# Patient Record
Sex: Female | Born: 1950 | Race: White | Hispanic: No | Smoking: Former smoker
Health system: Southern US, Community
[De-identification: ages and names within clinical notes are randomized; demographics above are authoritative.]

## PROBLEM LIST (undated history)

## (undated) DIAGNOSIS — F32A Depression, unspecified: Secondary | ICD-10-CM

## (undated) DIAGNOSIS — G43909 Migraine, unspecified, not intractable, without status migrainosus: Secondary | ICD-10-CM

## (undated) DIAGNOSIS — E079 Disorder of thyroid, unspecified: Secondary | ICD-10-CM

## (undated) DIAGNOSIS — G4730 Sleep apnea, unspecified: Secondary | ICD-10-CM

## (undated) DIAGNOSIS — Z9289 Personal history of other medical treatment: Secondary | ICD-10-CM

## (undated) DIAGNOSIS — I10 Essential (primary) hypertension: Secondary | ICD-10-CM

## (undated) DIAGNOSIS — M1990 Unspecified osteoarthritis, unspecified site: Secondary | ICD-10-CM

## (undated) DIAGNOSIS — E0590 Thyrotoxicosis, unspecified without thyrotoxic crisis or storm: Secondary | ICD-10-CM

## (undated) DIAGNOSIS — F319 Bipolar disorder, unspecified: Secondary | ICD-10-CM

## (undated) DIAGNOSIS — N189 Chronic kidney disease, unspecified: Secondary | ICD-10-CM

## (undated) DIAGNOSIS — I499 Cardiac arrhythmia, unspecified: Secondary | ICD-10-CM

## (undated) DIAGNOSIS — F329 Major depressive disorder, single episode, unspecified: Secondary | ICD-10-CM

## (undated) DIAGNOSIS — F419 Anxiety disorder, unspecified: Secondary | ICD-10-CM

## (undated) DIAGNOSIS — R001 Bradycardia, unspecified: Secondary | ICD-10-CM

## (undated) HISTORY — DX: Anxiety disorder, unspecified: F41.9

## (undated) HISTORY — PX: HAMMER TOE SURGERY: SHX385

## (undated) HISTORY — PX: BUNIONECTOMY: SHX129

## (undated) HISTORY — DX: Bradycardia, unspecified: R00.1

## (undated) HISTORY — PX: ABLATION SAPHENOUS VEIN W/ RFA: SUR11

## (undated) HISTORY — PX: BLADDER SURGERY: SHX569

## (undated) HISTORY — PX: ABDOMINAL HYSTERECTOMY: SHX81

## (undated) HISTORY — PX: ROTATOR CUFF REPAIR: SHX139

---

## 2000-03-29 ENCOUNTER — Encounter: Payer: Self-pay | Admitting: Family Medicine

## 2000-03-29 ENCOUNTER — Encounter: Admission: RE | Admit: 2000-03-29 | Discharge: 2000-03-29 | Payer: Self-pay | Admitting: Family Medicine

## 2000-04-04 ENCOUNTER — Encounter: Admission: RE | Admit: 2000-04-04 | Discharge: 2000-04-04 | Payer: Self-pay | Admitting: Gastroenterology

## 2000-04-04 ENCOUNTER — Encounter: Payer: Self-pay | Admitting: Gastroenterology

## 2001-07-08 ENCOUNTER — Encounter: Payer: Self-pay | Admitting: Emergency Medicine

## 2001-07-08 ENCOUNTER — Emergency Department (HOSPITAL_COMMUNITY): Admission: EM | Admit: 2001-07-08 | Discharge: 2001-07-08 | Payer: Self-pay | Admitting: Emergency Medicine

## 2002-05-11 ENCOUNTER — Other Ambulatory Visit: Admission: RE | Admit: 2002-05-11 | Discharge: 2002-05-11 | Payer: Self-pay | Admitting: Obstetrics and Gynecology

## 2002-06-08 ENCOUNTER — Observation Stay (HOSPITAL_COMMUNITY): Admission: RE | Admit: 2002-06-08 | Discharge: 2002-06-09 | Payer: Self-pay | Admitting: Urology

## 2002-10-13 ENCOUNTER — Emergency Department (HOSPITAL_COMMUNITY): Admission: EM | Admit: 2002-10-13 | Discharge: 2002-10-14 | Payer: Self-pay | Admitting: Emergency Medicine

## 2002-10-13 ENCOUNTER — Encounter: Payer: Self-pay | Admitting: Family Medicine

## 2002-10-13 ENCOUNTER — Ambulatory Visit (HOSPITAL_COMMUNITY): Admission: RE | Admit: 2002-10-13 | Discharge: 2002-10-13 | Payer: Self-pay | Admitting: Family Medicine

## 2003-02-25 ENCOUNTER — Encounter: Admission: RE | Admit: 2003-02-25 | Discharge: 2003-02-25 | Payer: Self-pay | Admitting: Family Medicine

## 2003-02-25 ENCOUNTER — Encounter: Payer: Self-pay | Admitting: Family Medicine

## 2004-11-27 ENCOUNTER — Ambulatory Visit (HOSPITAL_BASED_OUTPATIENT_CLINIC_OR_DEPARTMENT_OTHER): Admission: RE | Admit: 2004-11-27 | Discharge: 2004-11-27 | Payer: Self-pay | Admitting: *Deleted

## 2004-12-10 ENCOUNTER — Ambulatory Visit: Payer: Self-pay | Admitting: Internal Medicine

## 2004-12-14 ENCOUNTER — Ambulatory Visit (HOSPITAL_COMMUNITY): Admission: RE | Admit: 2004-12-14 | Discharge: 2004-12-14 | Payer: Self-pay | Admitting: Chiropractic Medicine

## 2005-05-08 ENCOUNTER — Ambulatory Visit: Payer: Self-pay | Admitting: Internal Medicine

## 2005-06-11 ENCOUNTER — Ambulatory Visit: Payer: Self-pay | Admitting: Internal Medicine

## 2006-01-15 ENCOUNTER — Ambulatory Visit: Payer: Self-pay | Admitting: Internal Medicine

## 2006-06-26 ENCOUNTER — Emergency Department (HOSPITAL_COMMUNITY): Admission: EM | Admit: 2006-06-26 | Discharge: 2006-06-26 | Payer: Self-pay | Admitting: Emergency Medicine

## 2006-08-27 HISTORY — PX: JOINT REPLACEMENT: SHX530

## 2006-12-26 ENCOUNTER — Ambulatory Visit (HOSPITAL_COMMUNITY): Admission: RE | Admit: 2006-12-26 | Discharge: 2006-12-27 | Payer: Self-pay | Admitting: Urology

## 2007-04-17 ENCOUNTER — Encounter: Admission: RE | Admit: 2007-04-17 | Discharge: 2007-04-17 | Payer: Self-pay | Admitting: Orthopedic Surgery

## 2007-06-12 ENCOUNTER — Encounter: Admission: RE | Admit: 2007-06-12 | Discharge: 2007-06-12 | Payer: Self-pay | Admitting: Orthopedic Surgery

## 2007-07-07 ENCOUNTER — Inpatient Hospital Stay (HOSPITAL_COMMUNITY): Admission: RE | Admit: 2007-07-07 | Discharge: 2007-07-11 | Payer: Self-pay | Admitting: Orthopedic Surgery

## 2009-12-09 ENCOUNTER — Encounter: Admission: RE | Admit: 2009-12-09 | Discharge: 2009-12-09 | Payer: Self-pay | Admitting: Family Medicine

## 2009-12-28 ENCOUNTER — Ambulatory Visit (HOSPITAL_COMMUNITY): Admission: RE | Admit: 2009-12-28 | Discharge: 2009-12-28 | Payer: Self-pay | Admitting: Family Medicine

## 2010-04-20 ENCOUNTER — Encounter: Admission: RE | Admit: 2010-04-20 | Discharge: 2010-04-20 | Payer: Self-pay | Admitting: Family Medicine

## 2011-01-09 NOTE — Op Note (Signed)
NAME:  ITALY, WARRINER NO.:  [id number]   MEDICAL RECORD NO.:  [id number]          PATIENT TYPE:  INP   LOCATION:  5035                         FACILITY:  [facility]   PHYSICIAN:  Feliberto Gottron. Turner Daniels, M.D.   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   OPERATION PERFORMED:  Left total hip arthroplasty using 250 two  millimeter ASR cup,  NK plus 046 mm ultimate ball, 18 x 13 x 42 x 160 S-  ROM stem, 18 D small cone.   SURGEON:  Feliberto Gottron. Turner Daniels, M.D.   FIRST ASSISTANT:  Erskine Squibb B. Su Hilt, P. A.-C   ANESTHESIA:  General endotracheal.   ESTIMATED BLOOD LOSS:  The estimated blood loss was 400 mL.   FLUIDS REPLACED:  The fluid replacement was 1600 mL of crystalloid.   DRAINS:  The drain placed was a Foley catheter.   URINE OUTPUT:  The urine output was 300 mL.   INDICATIONS FOR THE SURGERY:  The patient is a 60 year old woman with  bone-on-bone arthritic changes of her left hip initially evaluated by  Dr. Lunette Stands and Dr. Salvatore Marvel.  She had fairly impressive super  acetabular cyst.  CT scan was accomplished showing the cyst and she was  seen in consultation for this problem in our office a few weeks ago.  The risks and benefits of surgical intervention, specifically hip  replacement and possible bone grafting the cyst were discussed with the  patient.  She desires to have the hip replaced to decrease pain and  increase function; and, again, the risks and benefits were discussed,  and questions were answered.   DESCRIPTION OF THE OPERATION:  The patient was identified by her armband  and was brought to the operating room at [facility].  Appropriate inspect monitors were attached and general  endotracheal  anesthesia was induced with the patient in the supine position.  She was  then rolled into the right lateral decubitus position and fixed there  was Gaspar Garbe II pelvic clamp.  The left lower extremity was prepped  and  draped in the usual sterile fashion from the ankle to the  hemipelvis.   The skin along the lateral hip and thigh was then infiltrated with 25 mL  of one-half percent Marcaine and epinephrine solution and a  posterolateral approach to the hip joint was made with an incision about  25 cm in length centered over the greater trochanter.  She had a very  generous adipose pad.  Adson-Beckman retractors were needed to move the  fat pad out of the way as we incised the intertrochanteric band in line  with the skin incision exposing the greater trochanter.  Cobra  retractors were then placed between the gluteus minimus, the superior  joint capsule, between the quadratus femoris  and the inferior joint  capsule.  This isolated the short external rotators and piriformis,  which were cutoff at their insertion on the intertrochanteric crest now  fully exposing the hip joint capsule, which was undeveloped into an  acetabular-based flap from posterosuperior to posteroinferior.  This  flap was tagged with two #2 Ethibond sutures; and, the hip was flexed  and internally rotated dislocating the femoral head.  Small bleeders  were identified and cauterized with the electrocautery.   A standard neck cut was then performed one fingerbreadth above the  lesser trochanter removing the femoral head, which was down to bare bone  with arthritic changes along the sourcil region. The proximal femur was  then translated anteriorly, levering off the anterior column with a  Cobra retractor.  A wing retractor was placed at the junction of the  acetabulum and the ischium, and then another Cobra spiked retractor was  placed in the cotyloid notch.  This exposed the acetabulum.  The labrum   and pulvinar were removed with the electrocautery.  We then sequentially  reamed up to a 51 mm basket reamer obtaining a good cut and just into  the subchondral bone and all quadrants and we actually encountered very  little in the was of the cyst.  This was probed and a Engineer, site.   At this point we hammered into place a 52 mm ARS cup in 45 degrees of  abduction and about 30 degrees of anteversion obtaining a good fit as  the component was driven.  The hip was then flexed and  internally  rotated exposing the proximal femur, which was entered with the  initiating reamer followed by axial reaming up to 12 mm where we started  getting a little bit of chatter.  We then went by one-half millimeter up  the 13.5, obtained good cortical chatter through the full depth of the  reamer.  We then conically reamed up to an 18 D cone and milled the  calcar to an 18 D small calcar.  At this point a trial 18 D small cone  was placed in the femur followed by trial stem with a 42 base neck NK  plus 046-mm trial ball.  The hip was then relocated.  It could not be  dislocated anteriorly in extension and external rotation; and, flexion  to 90 with 70 of internal rotation revealed good stability.   At this point the trial components were removed.  An 80 D small ZTT-1  cone was then hammered into the proximal femur.  The 13.5 reamer was run  inside the cone down the canal one more time and a 18 x 13 x 160 stem  was then driven through the cone down the femur in the same version as  the calcar until it seated.  An NK +046 mm ultimate ball was then  hammered onto the stem.  The hip once again relocated.  Stability was  noted to be excellent.  The wound was irrigated out normal saline  solution.  No significant bleeding was noted and the intertrochanteric  band was then closed with running #1 Vicryl suture.  The subcutaneous  tissue with 0-suture in two layers and then 2-0 suture and one layer,  and the skin with  running interlocking 3-0 nylon suture.  A dressing of  Xeroform and metel were then applied.   The patient was unclamped, laid supine, awakened and taken to the  recovery room without difficulty.  Feliberto Gottron. Turner Daniels, M.D.  Electronically Signed     FJR/MEDQ  D:  07/07/2007  T:  07/08/2007  Job:  [id number]

## 2011-01-12 NOTE — Procedures (Signed)
NAME:  Heather Gray, Heather Gray NO.:  [id number]   MEDICAL RECORD NO.:  [id number]          PATIENT TYPE:  OUT   LOCATION:  SLEEP CENTER                 FACILITY:  [facility]   PHYSICIAN:  Clinton D. Maple Hudson, M.D. DATE OF BIRTH:  08-Jun-1951   DATE OF STUDY:  11/27/2004                              NOCTURNAL POLYSOMNOGRAM   DATE OF STUDY:  November 27, 2004   REFERRING PHYSICIAN:  Dr. Dara Hoyer   INDICATION FOR STUDY:  Hypersomnia with sleep apnea.  Epworth Sleepiness  Score 19/24, BMI 33, weight 203 pounds.   SLEEP ARCHITECTURE:  Total sleep time 413 minutes with sleep efficiency 92%.  Stage I was 7%, stage II 68%, stages III and IV were absent, REM was 26% of  total sleep time.  Sleep latency 6 minutes, REM latency 101 minutes, awake  after sleep onset 30 minutes, arousal index 9.7.  Patient took Klonopin and  Toprol during the evening, prior to the study.   RESPIRATORY DATA:  Split-study protocol.  Respiratory disturbance index  (RDI, AHI) 19.9 obstructive events per hour indicating mild to moderate  obstructive sleep apnea/hypopnea syndrome before CPAP.  This included 56  hypopneas before CPAP.  Events were not positional but initial sleep and  most events  were mostly on the left side.  REM RDI 19.2.  CPAP was titrated  to 8 CWP, RDI 0.4 per hour.  A petite Respironics ComfortGel Nasal Mask was  used with heated humidifier.  Technician also suggested a chin strap be  provided.   OXYGEN DATA:  Moderate snoring with oxygen desaturation to a nadir of 88%  before CPAP control.  On CPAP saturation held 96-98% on room air.   CARDIAC DATA:  Normal sinus rhythm with rare PVC and PAC.   MOVEMENT/PARASOMNIA:  Occasional leg jerks with arousal, probably not  significant.   IMPRESSION/RECOMMENDATION:  1.  Mild to moderate obstructive sleep apnea/hypopnea syndrome, respiratory      disturbance index 19.9 per hour with moderate snoring and oxygen      desaturation to 88%.  2.   Continuous positive airway pressure titration to 8 CWP, respiratory      disturbance index 0.4 per hour using a petite Respironics ComfortGel      Nasal Mask with heated humidifier.      CDY/MEDQ  D:  12/10/2004 16:20:26  T:  12/10/2004 21:02:08  Job:  [id number]

## 2011-01-12 NOTE — Discharge Summary (Signed)
NAME:  Heather Gray, Heather Gray NO.:  [id number]   MEDICAL RECORD NO.:  [id number]          PATIENT TYPE:  INP   LOCATION:  5035                         FACILITY:  [facility]   PHYSICIAN:  Feliberto Gottron. Turner Daniels, M.D.   DATE OF BIRTH:  Apr 20, 1951   DATE OF ADMISSION:  07/07/2007  DATE OF DISCHARGE:  07/11/2007                               DISCHARGE SUMMARY   DIAGNOSIS:  End stage degenerative joint disease of the left hip.   PROCEDURES WHILE IN HOSPITAL:  Left total hip arthroplasty.   HISTORY OF PRESENT ILLNESS:  The patient is a 60 year old woman with  many year history of pain in her left hip.  She was followed  conservatively and has failed conservative care and wishes to proceed  with total hip arthroplasty, after discussing the risks versus benefits.   ALLERGIES:  TETRACYCLINE, DILAUDID.   MEDICATIONS:  1. Topamax.  2. Baclofen.  3. Toprol.  4. Vicodin.   PAST MEDICAL HISTORY:  Usual childhood diseases.  Adult history of  hypertension, sleep apnea, migraines, fibromyalgia.   PAST SURGICAL HISTORY:  Hysterectomy, bladder surgery, __________   SOCIAL HISTORY:  No tobacco, no IV drug abuse.  She is divorced.   FAMILY HISTORY:  Noncontributory.   REVIEW OF SYSTEMS:  Glasses, headaches, lower dentures.  Denies  shortness of breath, chest pain or illness.   PHYSICAL EXAMINATION:  Exam unremarkable except for extremities which  showed left hip internal rotation of 0 with pain with external rotation  at 10 degrees with positive pain, foot tip is negative.  She has bone on  bone with cystic changes, end-stage degenerative joint disease of the  hip.   LABORATORY DATA:  Preoperative labs including CBC, CMET, chest x-ray,  PT/PTT did not prevent further progress.   HOSPITAL COURSE:  She was taken to the operating room at [facility]  on the day of admission where she underwent a left total hip  arthroplasty using a 52 mm ASR cup and AK +0 46 mm Ultima ball, 18 x 13  x 42  x 160 SROM stem and 18 D small cone.  A Foley catheter was placed.  The patient was placed on perioperative antibiotics and postoperative  Coumadin for prophylaxis and postoperative PCA for pain control.  On  postoperative day #1 the patient was awake and alert with no nausea.  Vital signs were stable.  Wound was clean and dry.  Hemoglobin  __________ , otherwise medically stable and began physical therapy in  earnest.  On postoperative day #2 the patient was without complaint,  positive nausea and vomiting, T max 101.7, INR 1.2.  Dressing was dry.  Foley was discontinued.  On postoperative day #3 the patient was meeting  her physical therapy goals well and progressing well.  On postoperative  day #4 the patient was without complaint was independent with transfers  and ready to go home.  She was afebrile.  INR was 1.6.  PT 19.5.  Dressing with scant drainage.  No shortness of breath.  Sutures were  intact.  She was  medically stable and orthopedically improved.  She was  discharged home in the care of her family.   ACTIVITY:  Weight-bearing as tolerated with walker.  Total hip  precautions.   DISCHARGE MEDICATIONS:  1. Tylox.  2. Coumadin per [facility].  3. Resume home medications as formerly on medications sheet.   DIET:  Regular.   WOUND CARE:  Dressing changes every other day.   FOLLOWUP:  May return in five days for followup check.      Laural Benes. Jannet Mantis.      Feliberto Gottron. Turner Daniels, M.D.  Electronically Signed    JBR/MEDQ  D:  09/18/2007  T:  09/19/2007  Job:  [id number]

## 2011-01-12 NOTE — Op Note (Signed)
NAME:  Heather Gray, Heather Gray NO.:  [id number]   MEDICAL RECORD NO.:  [id number]          PATIENT TYPE:  AMB   LOCATION:  DAY                          FACILITY:  [facility]   PHYSICIAN:  Terie Purser, MD         DATE OF BIRTH:  [birth year]   DATE OF PROCEDURE:  DATE OF DISCHARGE:                               OPERATIVE REPORT   PREOPERATIVE DIAGNOSIS:  Vaginal extrusion of pubovaginal sling.   POSTOPERATIVE DIAGNOSIS:  Vaginal extrusion of pubovaginal sling.   PROCEDURE:  Excision of vaginal extrusion of sling.  Placement of  Xenoform packing.  Right lateral vaginoplasty.   SURGEON:  Sigmund I. Patsi Sears, M.D.   ASSISTANTWilson Singer   ANESTHESIA:  General.   BLOOD LOSS:  Minimal.   COMPLICATIONS:  None   DISPOSITION:  Stable to post anesthesia care unit.   INDICATIONS FOR PROCEDURE:  This patient is a 60 year old female who had  a history of SPARC sling placed in 2003.  Approximately 2 months ago,  she underwent evaluation by her gynecologist for recurrent urinary tract  infection and was found to have vaginal extrusion of her sling.  She was  evaluated by Dr. Patsi Sears in the office and a decision was made to  take to her to the operating room for excision of extruded sling.  Benefits and risks of procedure were explained and consent was obtained.   DESCRIPTION OF PROCEDURE:  The patient brought to the operating room and  was properly identified.  Time-out was performed to confirm correct  patient and procedure.  She was administered general anesthesia and  given preoperative antibiotics and then placed in the lithotomy  position.  She was prepped and draped in the sterile manner.  Care was  taken to ensure proper positioning of legs to prevent peripheral  neuropathy and DVT.  We then placed a weighted vaginal speculum.  Examination revealed an approximate 1.5-cm extrusion of sling located  over the urethra.  A Foley catheter was placed.  We grasped the extruded  sling material and used this for traction.  We then used a knife to  incise the vaginal mucosa at the lateral edges surrounding the  extrusion.  This was taken widely to develop a flap of tissue.  We  dissected carefully as much of the sling as possible, taking care not to  injure the urethra.  When we had proceeded far enough, we cut the sling  material on the left side.  There appeared to be enough vaginal mucosa  to close over this.  Further excision of the sling risked injury to the  urethra and bladder and was deemed not necessary at this point.  We then  turned our attention to the right side.  Again, a similar technique was  used to create a vaginal flap and mobilization of the sling.  We were  able to take this back a bit farther on this side compared to the left.  The sling material was then excised.  Hemostasis was obtained with the  electrocautery.  There  was a persistent ooze from this region.  We  elected to pack this cavity with an approximately 2 x 3 cm piece of  Xenoform graft.  We then proceeded with closure of the vaginal mucosa.  This was done in a running fashion with 2-0 Monocryl suture.  Copious  amounts of antibiotic-soaked solution were used to irrigate throughout  the procedure.  We did appear to have an excellent closure of the mucosa  with no further evidence of sling material.   At this point, we removed the Foley catheter and cystoscopy was  performed.  The urethra was normal without evidence of injury or  extrusion.  The bladder was systematically examined.  There was no  mucosal lesions or evidence of erosion into the bladder.  Foley catheter  was then replaced.  Two vaginal packs were placed in the vagina for  hemostasis.  The patient was awakened from anesthesia and transported to  the recovery room in stable condition.  There no complications.  Please  note that Dr. Patsi Sears was present and participated in all aspects of  this procedure as primary  surgeon.           ______________________________  Terie Purser, MD     JH/MEDQ  D:  12/26/2006  T:  12/26/2006  Job:  [id number]

## 2011-01-12 NOTE — Op Note (Signed)
NAME:  Heather Gray, Heather Gray                                ACCOUNT NO.:  [id number]   MEDICAL RECORD NO.:  [id number]                   PATIENT TYPE:  OBV   LOCATION:  0378                                 FACILITY:  [facility]   PHYSICIAN:  Sigmund I. Patsi Sears, M.D.         DATE OF BIRTH:  [birth year]   DATE OF PROCEDURE:  06/08/2002  DATE OF DISCHARGE:                                 OPERATIVE REPORT   PREOPERATIVE DIAGNOSES:  Stress urinary incontinence.   POSTOPERATIVE DIAGNOSES:  Stress urinary incontinence.   PROCEDURE:  SPARC urethral sling.   ANESTHESIA:  General.   SURGEON:  Sigmund I. Patsi Sears, M.D.   ASSISTANT:  Melvyn Novas, M.D.   BRIEF HISTORY:  This is a 60 year old white female who has been demonstrated  to have stress urinary incontinence by urodynamic studies. She has opted for  urethral sling placement.   DESCRIPTION OF PROCEDURE:  Following administration of IV antibiotics and  general anesthesia, Heather Gray was prepped and draped in the dorsal lithotomy  position. A weighted speculum was placed in the vagina to pull down the  posterior vaginal wall and expose the anterior vaginal wall and underlying  urethra. A 16 French Foley catheter was placed sterilely and then clamped on  the field after emptying the bladder. A 1 1/2 to 2 cm longitudinal incision  was made at the bladder neck along the anterior vaginal wall mucosa. This  was dissected submucosally over the urethra laterally in both directions  approximately 1-2 cm. This was performed using Metzenbaum scissors. At that  point, two small abdominal incisions were made through the skin just lateral  and superior to the superior edges of the pubic symphysis. The SPARC curved  needles were then passed through the abdominal incisions and directed down  to the previously dissected anterior vaginal mucosa. The surgeon's finger  was placed into the submucosal dissection space to fill the tip of the SPARC  needle as it  passed through mucosal tissues and out the incision. This was  performed on both the left and right sides without difficulty. At that time,  cystoscopy was performed using rigid cystoscope with a 22 French sheath.  Both 12 and 70 degree lenses were used. There was no evidence of violation  of the bladder by either needle. No mucosal abnormalities noted. Both  ureteral orifices are identified. At that time, the [facility] sling was screwed  into the end of each _______ and was gently pulled back up towards the  abdominal incision. These were pulled until the tape was flush against the  urethra with enough space to place a right angled clamp and there was  basically no tension on the urethra. At that time, we again performed  cystoscopy as previously described. Again this demonstrated no violation of  the bladder. The sheath and the tape was then removed being careful to keep  the tape in the  same position. The tape was then cut beneath the skin at  each small abdominal incision. Both sites were then closed using Benzoin and  Steri-Strips. The vaginal mucosa was inspected. There was a small amount of  oozing from the incision which was packed with Surgicel. This stopped the  bleeding. The vaginal mucosal incision was then closed using 2-0 Vicryl  suture. The vagina was then packed with a sterile dressing. A Foley catheter  was placed to straight drainage. The procedure was then terminated.   ESTIMATED BLOOD LOSS:  50 cc   COMPLICATIONS:  None.   DRAINS:  Foley catheter.   PACKS:  Vaginal packing.   DISPOSITION:  The patient went to the recovery room in stable condition.     Melvyn Novas, M.D.                      Sigmund I. Patsi Sears, M.D.    DK/MEDQ  D:  06/08/2002  T:  06/08/2002  Job:  [id number]

## 2011-06-05 LAB — COMPREHENSIVE METABOLIC PANEL
AST: 22
Alkaline Phosphatase: 84
CO2: 28
Calcium: 9.6
Chloride: 110
Creatinine, Ser: 1.22 — ABNORMAL HIGH
GFR calc non Af Amer: 46 — ABNORMAL LOW
Glucose, Bld: 103 — ABNORMAL HIGH
Sodium: 144
Total Protein: 6.6

## 2011-06-05 LAB — URINE MICROSCOPIC-ADD ON

## 2011-06-05 LAB — DIFFERENTIAL
Basophils Absolute: 0
Eosinophils Relative: 2
Lymphocytes Relative: 27
Lymphs Abs: 2.3
Monocytes Absolute: 0.4

## 2011-06-05 LAB — CBC
HCT: 23.6 — ABNORMAL LOW
HCT: 27.1 — ABNORMAL LOW
HCT: 31.5 — ABNORMAL LOW
Hemoglobin: 8.1 — ABNORMAL LOW
Hemoglobin: 9.2 — ABNORMAL LOW
MCHC: 34
MCHC: 34.2
MCV: 92.5
MCV: 93.1
Platelets: 155
RBC: 2.54 — ABNORMAL LOW
RBC: 2.93 — ABNORMAL LOW
RBC: 3.36 — ABNORMAL LOW
RBC: 4.01
RDW: 13.2
RDW: 13.2
WBC: 8.3
WBC: 9.3

## 2011-06-05 LAB — PROTIME-INR
INR: 2.1 — ABNORMAL HIGH
Prothrombin Time: 13.3
Prothrombin Time: 13.8

## 2011-06-05 LAB — ABO/RH: ABO/RH(D): A POS

## 2011-06-05 LAB — URINALYSIS, ROUTINE W REFLEX MICROSCOPIC
Bilirubin Urine: NEGATIVE
Ketones, ur: NEGATIVE
Urobilinogen, UA: 0.2

## 2011-06-05 LAB — TYPE AND SCREEN: ABO/RH(D): A POS

## 2012-05-08 ENCOUNTER — Other Ambulatory Visit (HOSPITAL_COMMUNITY): Payer: Self-pay | Admitting: Family Medicine

## 2012-05-08 DIAGNOSIS — E0590 Thyrotoxicosis, unspecified without thyrotoxic crisis or storm: Secondary | ICD-10-CM

## 2012-05-19 ENCOUNTER — Encounter (HOSPITAL_COMMUNITY)
Admission: RE | Admit: 2012-05-19 | Discharge: 2012-05-19 | Disposition: A | Discharge status: Home / Self Care | Payer: BC Managed Care – PPO | Source: Ambulatory Visit | Attending: Family Medicine | Admitting: Family Medicine

## 2012-05-19 DIAGNOSIS — E0590 Thyrotoxicosis, unspecified without thyrotoxic crisis or storm: Secondary | ICD-10-CM | POA: Insufficient documentation

## 2012-05-20 ENCOUNTER — Encounter (HOSPITAL_COMMUNITY)
Admission: RE | Admit: 2012-05-20 | Discharge: 2012-05-20 | Disposition: A | Discharge status: Home / Self Care | Payer: BC Managed Care – PPO | Source: Ambulatory Visit | Attending: Family Medicine | Admitting: Family Medicine

## 2012-05-20 MED ORDER — SODIUM PERTECHNETATE TC 99M INJECTION
10.0000 | Freq: Once | INTRAVENOUS | Status: AC | PRN
  Administered 2012-05-20: 10 via INTRAVENOUS

## 2012-05-20 MED ORDER — SODIUM IODIDE I 131 CAPSULE
11.0000 | Freq: Once | INTRAVENOUS | Status: AC | PRN
  Administered 2012-05-20: 11 via ORAL

## 2012-05-21 ENCOUNTER — Other Ambulatory Visit (HOSPITAL_COMMUNITY): Payer: Self-pay | Admitting: Family Medicine

## 2012-05-21 DIAGNOSIS — E041 Nontoxic single thyroid nodule: Secondary | ICD-10-CM

## 2012-05-22 ENCOUNTER — Ambulatory Visit (HOSPITAL_COMMUNITY)
Admission: RE | Admit: 2012-05-22 | Discharge: 2012-05-22 | Disposition: A | Discharge status: Home / Self Care | Payer: BC Managed Care – PPO | Source: Ambulatory Visit | Attending: Family Medicine | Admitting: Family Medicine

## 2012-05-22 DIAGNOSIS — E041 Nontoxic single thyroid nodule: Secondary | ICD-10-CM

## 2012-05-22 DIAGNOSIS — E0520 Thyrotoxicosis with toxic multinodular goiter without thyrotoxic crisis or storm: Secondary | ICD-10-CM | POA: Insufficient documentation

## 2012-08-11 ENCOUNTER — Ambulatory Visit
Admission: RE | Admit: 2012-08-11 | Discharge: 2012-08-11 | Disposition: A | Discharge status: Home / Self Care | Payer: BC Managed Care – PPO | Source: Ambulatory Visit | Attending: Family Medicine | Admitting: Family Medicine

## 2012-08-11 ENCOUNTER — Other Ambulatory Visit: Payer: Self-pay | Admitting: Family Medicine

## 2012-08-11 DIAGNOSIS — R112 Nausea with vomiting, unspecified: Secondary | ICD-10-CM

## 2013-10-16 ENCOUNTER — Encounter (HOSPITAL_COMMUNITY): Payer: Self-pay | Admitting: Emergency Medicine

## 2013-10-16 ENCOUNTER — Encounter (HOSPITAL_COMMUNITY): Payer: Self-pay | Admitting: *Deleted

## 2013-10-16 ENCOUNTER — Inpatient Hospital Stay (HOSPITAL_COMMUNITY)
Admission: AD | Admit: 2013-10-16 | Discharge: 2013-10-21 | DRG: 885 | Disposition: A | Discharge status: Home / Self Care | Payer: BC Managed Care – PPO | Source: Intra-hospital | Attending: Psychiatry | Admitting: Psychiatry

## 2013-10-16 ENCOUNTER — Emergency Department (HOSPITAL_COMMUNITY)
Admission: EM | Admit: 2013-10-16 | Discharge: 2013-10-16 | Disposition: A | Discharge status: Other Acute Inpatient Hospital | Payer: BC Managed Care – PPO | Attending: Emergency Medicine | Admitting: Emergency Medicine

## 2013-10-16 DIAGNOSIS — F1210 Cannabis abuse, uncomplicated: Secondary | ICD-10-CM | POA: Diagnosis present

## 2013-10-16 DIAGNOSIS — Z79899 Other long term (current) drug therapy: Secondary | ICD-10-CM | POA: Insufficient documentation

## 2013-10-16 DIAGNOSIS — I10 Essential (primary) hypertension: Secondary | ICD-10-CM | POA: Diagnosis present

## 2013-10-16 DIAGNOSIS — R45851 Suicidal ideations: Secondary | ICD-10-CM

## 2013-10-16 DIAGNOSIS — F329 Major depressive disorder, single episode, unspecified: Secondary | ICD-10-CM | POA: Insufficient documentation

## 2013-10-16 DIAGNOSIS — F332 Major depressive disorder, recurrent severe without psychotic features: Secondary | ICD-10-CM

## 2013-10-16 DIAGNOSIS — Z862 Personal history of diseases of the blood and blood-forming organs and certain disorders involving the immune mechanism: Secondary | ICD-10-CM | POA: Insufficient documentation

## 2013-10-16 DIAGNOSIS — F3289 Other specified depressive episodes: Secondary | ICD-10-CM | POA: Insufficient documentation

## 2013-10-16 DIAGNOSIS — Z8639 Personal history of other endocrine, nutritional and metabolic disease: Secondary | ICD-10-CM | POA: Insufficient documentation

## 2013-10-16 HISTORY — DX: Major depressive disorder, single episode, unspecified: F32.9

## 2013-10-16 HISTORY — DX: Depression, unspecified: F32.A

## 2013-10-16 HISTORY — DX: Disorder of thyroid, unspecified: E07.9

## 2013-10-16 HISTORY — DX: Migraine, unspecified, not intractable, without status migrainosus: G43.909

## 2013-10-16 HISTORY — DX: Essential (primary) hypertension: I10

## 2013-10-16 LAB — COMPREHENSIVE METABOLIC PANEL
ALBUMIN: 4 g/dL (ref 3.5–5.2)
ALT: 18 U/L (ref 0–35)
AST: 19 U/L (ref 0–37)
Alkaline Phosphatase: 108 U/L (ref 39–117)
BILIRUBIN TOTAL: 0.6 mg/dL (ref 0.3–1.2)
BUN: 12 mg/dL (ref 6–23)
CALCIUM: 9.9 mg/dL (ref 8.4–10.5)
CHLORIDE: 105 meq/L (ref 96–112)
CO2: 23 mEq/L (ref 19–32)
CREATININE: 0.51 mg/dL (ref 0.50–1.10)
GFR calc Af Amer: >90 mL/min (ref >90)
GFR calc non Af Amer: >90 mL/min (ref >90)
Glucose, Bld: 105 mg/dL — ABNORMAL HIGH (ref 70–99)
Potassium: 3.7 mEq/L (ref 3.7–5.3)
Sodium: 143 mEq/L (ref 137–147)
TOTAL PROTEIN: 7.2 g/dL (ref 6.0–8.3)

## 2013-10-16 LAB — CBC
HCT: 39.2 % (ref 36.0–46.0)
Hemoglobin: 13.4 g/dL (ref 12.0–15.0)
MCH: 29.5 pg (ref 26.0–34.0)
MCHC: 34.2 g/dL (ref 30.0–36.0)
MCV: 86.3 fL (ref 78.0–100.0)
PLATELETS: 193 10*3/uL (ref 150–400)
RBC: 4.54 MIL/uL (ref 3.87–5.11)
RDW: 13 % (ref 11.5–15.5)
WBC: 7.1 10*3/uL (ref 4.0–10.5)

## 2013-10-16 LAB — ETHANOL: Alcohol, Ethyl (B): <11 mg/dL (ref 0–11)

## 2013-10-16 LAB — SALICYLATE LEVEL

## 2013-10-16 LAB — RAPID URINE DRUG SCREEN, HOSP PERFORMED
AMPHETAMINES: NOT DETECTED
BENZODIAZEPINES: NOT DETECTED
Barbiturates: NOT DETECTED
COCAINE: NOT DETECTED
OPIATES: NOT DETECTED
Tetrahydrocannabinol: POSITIVE — AB

## 2013-10-16 LAB — ACETAMINOPHEN LEVEL: Acetaminophen (Tylenol), Serum: <15 ug/mL (ref 10–30)

## 2013-10-16 MED ORDER — ZOLPIDEM TARTRATE 10 MG PO TABS: 10.0000 mg | ORAL_TABLET | Freq: Every evening | ORAL | Status: DC | PRN

## 2013-10-16 MED ORDER — ONDANSETRON HCL 4 MG PO TABS
4.0000 mg | ORAL_TABLET | Freq: Three times a day (TID) | ORAL | Status: DC | PRN
Start: 2013-10-16 — End: 2013-10-22

## 2013-10-16 MED ORDER — MAGNESIUM HYDROXIDE 400 MG/5ML PO SUSP: 30.0000 mL | Freq: Every day | ORAL | Status: DC | PRN

## 2013-10-16 MED ORDER — ALUM & MAG HYDROXIDE-SIMETH 200-200-20 MG/5ML PO SUSP: 30.0000 mL | ORAL | Status: DC | PRN

## 2013-10-16 MED ORDER — IBUPROFEN 200 MG PO TABS
600.0000 mg | ORAL_TABLET | Freq: Three times a day (TID) | ORAL | Status: DC | PRN
Start: 2013-10-16 — End: 2013-10-16

## 2013-10-16 MED ORDER — ONDANSETRON HCL 4 MG PO TABS: 4.0000 mg | ORAL_TABLET | Freq: Three times a day (TID) | ORAL | Status: DC | PRN

## 2013-10-16 MED ORDER — METOPROLOL SUCCINATE ER 100 MG PO TB24
100.0000 mg | ORAL_TABLET | Freq: Every evening | ORAL | Status: DC
  Administered 2013-10-17 – 2013-10-20 (×4): 100 mg via ORAL
  Filled 2013-10-16 (×6): qty 1

## 2013-10-16 MED ORDER — LOSARTAN POTASSIUM 50 MG PO TABS: 100.0000 mg | ORAL_TABLET | Freq: Every day | ORAL | Status: DC

## 2013-10-16 MED ORDER — NICOTINE 21 MG/24HR TD PT24: 21.0000 mg | MEDICATED_PATCH | Freq: Every day | TRANSDERMAL | Status: DC

## 2013-10-16 MED ORDER — FLUOXETINE HCL 20 MG PO CAPS
40.0000 mg | ORAL_CAPSULE | Freq: Every evening | ORAL | Status: DC
  Administered 2013-10-16: 40 mg via ORAL
  Filled 2013-10-16: qty 2

## 2013-10-16 MED ORDER — LOSARTAN POTASSIUM 50 MG PO TABS
100.0000 mg | ORAL_TABLET | Freq: Every day | ORAL | Status: DC
  Administered 2013-10-17 – 2013-10-21 (×5): 100 mg via ORAL
  Filled 2013-10-16 (×7): qty 2

## 2013-10-16 MED ORDER — HYDROXYZINE HCL 25 MG PO TABS
50.0000 mg | ORAL_TABLET | Freq: Once | ORAL | Status: AC
  Administered 2013-10-16: 50 mg via ORAL
  Filled 2013-10-16: qty 2

## 2013-10-16 MED ORDER — TOPIRAMATE 100 MG PO TABS
100.0000 mg | ORAL_TABLET | Freq: Every day | ORAL | Status: DC
  Administered 2013-10-16 – 2013-10-20 (×5): 100 mg via ORAL
  Filled 2013-10-16 (×8): qty 1

## 2013-10-16 MED ORDER — FLUOXETINE HCL 20 MG PO CAPS
40.0000 mg | ORAL_CAPSULE | Freq: Every evening | ORAL | Status: DC
  Administered 2013-10-17 – 2013-10-20 (×4): 40 mg via ORAL
  Filled 2013-10-16 (×6): qty 2

## 2013-10-16 MED ORDER — ACETAMINOPHEN 325 MG PO TABS: 650.0000 mg | ORAL_TABLET | ORAL | Status: DC | PRN

## 2013-10-16 MED ORDER — ACETAMINOPHEN 325 MG PO TABS: 650.0000 mg | ORAL_TABLET | Freq: Four times a day (QID) | ORAL | Status: DC | PRN

## 2013-10-16 MED ORDER — ZOLPIDEM TARTRATE 5 MG PO TABS: 5.0000 mg | ORAL_TABLET | Freq: Every evening | ORAL | Status: DC | PRN

## 2013-10-16 MED ORDER — ZOLPIDEM TARTRATE 10 MG PO TABS
10.0000 mg | ORAL_TABLET | Freq: Every evening | ORAL | Status: DC | PRN
  Administered 2013-10-16 – 2013-10-19 (×4): 10 mg via ORAL
  Filled 2013-10-16 (×4): qty 1

## 2013-10-16 MED ORDER — LORAZEPAM 1 MG PO TABS: 1.0000 mg | ORAL_TABLET | Freq: Three times a day (TID) | ORAL | Status: DC | PRN

## 2013-10-16 MED ORDER — METHIMAZOLE 5 MG PO TABS
15.0000 mg | ORAL_TABLET | Freq: Every day | ORAL | Status: DC
  Administered 2013-10-17 – 2013-10-21 (×5): 15 mg via ORAL
  Filled 2013-10-16 (×7): qty 1

## 2013-10-16 MED ORDER — METOPROLOL SUCCINATE ER 100 MG PO TB24
100.0000 mg | ORAL_TABLET | Freq: Every evening | ORAL | Status: DC
  Administered 2013-10-16: 100 mg via ORAL
  Filled 2013-10-16: qty 1

## 2013-10-16 MED ORDER — TOPIRAMATE 25 MG PO TABS: 100.0000 mg | ORAL_TABLET | Freq: Every day | ORAL | Status: DC

## 2013-10-16 MED ORDER — METHIMAZOLE 5 MG PO TABS
15.0000 mg | ORAL_TABLET | Freq: Every day | ORAL | Status: DC
  Administered 2013-10-16: 15 mg via ORAL
  Filled 2013-10-16: qty 1

## 2013-10-16 MED ORDER — IBUPROFEN 600 MG PO TABS
600.0000 mg | ORAL_TABLET | Freq: Three times a day (TID) | ORAL | Status: DC | PRN
Start: 2013-10-16 — End: 2013-10-22

## 2013-10-16 NOTE — ED Provider Notes (Signed)
TIME SEEN: 10:48 AM  CHIEF COMPLAINT: Suicidal  HPI: Patient is a 63 year old female with a history of hypertension, hyperthyroidism, depression who is on Prozac 40 mg for many years who presents the emergency department with worsening depression and suicidal thoughts today with a plan to burn down her house. She states because she started having a feeling she went to work and told a nurse about her thoughts and her ex-husband brought her to her primary care physician's office. Her primary care physician then called EMS to bring her to the emergency department. Patient denies that she's ever had suicidal thoughts before. She denies a prior history of suicide attempts. No HI, hallucinations or delusions. No current medical complaints. She does not have a psychiatrist, therapist. She reports she is having multiple social stressors including family issues. Denies any alcohol use. She does smoke marijuana occasionally.  ROS: See HPI Constitutional: no fever  Eyes: no drainage  ENT: no runny nose   Cardiovascular:  no chest pain  Resp: no SOB  GI: no vomiting GU: no dysuria Integumentary: no rash  Allergy: no hives  Musculoskeletal: no leg swelling  Neurological: no slurred speech ROS otherwise negative  PAST MEDICAL HISTORY/PAST SURGICAL HISTORY:  Past Medical History  Diagnosis Date  . Depression   . Hypertension   . Thyroid disease     hyperthyroid    MEDICATIONS:  Prior to Admission medications   Medication Sig Start Date End Date Taking? Authorizing Provider  FLUoxetine (PROZAC) 40 MG capsule Take 40 mg by mouth every evening.   Yes Historical Provider, MD  losartan (COZAAR) 100 MG tablet Take 100 mg by mouth every morning.   Yes Historical Provider, MD  methimazole (TAPAZOLE) 10 MG tablet Take 15 mg by mouth every morning.   Yes Historical Provider, MD  metoprolol succinate (TOPROL-XL) 100 MG 24 hr tablet Take 100 mg by mouth every evening. Take with or immediately following a  meal.   Yes Historical Provider, MD  rizatriptan (MAXALT) 10 MG tablet Take 10 mg by mouth every 4 (four) hours as needed for migraine. May repeat in 2 hours if needed   Yes Historical Provider, MD  topiramate (TOPAMAX) 100 MG tablet Take 100 mg by mouth at bedtime.   Yes Historical Provider, MD  Vitamin D, Ergocalciferol, (DRISDOL) 50000 UNITS CAPS capsule Take 50,000 Units by mouth every 7 (seven) days.   Yes Historical Provider, MD  zolpidem (AMBIEN) 10 MG tablet Take 10 mg by mouth at bedtime.   Yes Historical Provider, MD    ALLERGIES:  Allergies  Allergen Reactions  . Tetracyclines & Related Other (See Comments)    Racing heart, fast breathing    SOCIAL HISTORY:  History  Substance Use Topics  . Smoking status: Not on file  . Smokeless tobacco: Not on file  . Alcohol Use: Not on file    FAMILY HISTORY: No family history on file.  EXAM: BP 161/93  Pulse 74  Temp(Src) 98.1 F (36.7 C) (Oral)  Resp 16  Ht 5\' 5"  (1.651 m)  Wt 165 lb (74.844 kg)  BMI 27.46 kg/m2  SpO2 98% CONSTITUTIONAL: Alert and oriented and responds appropriately to questions. Well-appearing; well-nourished HEAD: Normocephalic EYES: Conjunctivae clear, PERRL ENT: normal nose; no rhinorrhea; moist mucous membranes; pharynx without lesions noted NECK: Supple, no meningismus, no LAD  CARD: RRR; S1 and S2 appreciated; no murmurs, no clicks, no rubs, no gallops RESP: Normal chest excursion without splinting or tachypnea; breath sounds clear and equal bilaterally; no  wheezes, no rhonchi, no rales,  ABD/GI: Normal bowel sounds; non-distended; soft, non-tender, no rebound, no guarding BACK:  The back appears normal and is non-tender to palpation, there is no CVA tenderness EXT: Normal ROM in all joints; non-tender to palpation; no edema; normal capillary refill; no cyanosis    SKIN: Normal color for age and race; warm NEURO: Moves all extremities equally PSYCH: Patient is tearful and appears depressed. She  admits to suicidality with plan. No HI, hallucinations or delusions. Grooming and personal hygiene are appropriate.  MEDICAL DECISION MAKING: Patient here with suicidality with plan. She agrees to stay voluntarily. Her screening labs are unremarkable. Her urine drug screen is positive for marijuana which she admits to. We'll discuss with psychiatry for evaluation.    1:28 PM  Pt accepted to [facility].  Layla Maw Antonyo Hinderer, DO 10/16/13 1328

## 2013-10-16 NOTE — Tx Team (Signed)
Initial Interdisciplinary Treatment Plan  PATIENT STRENGTHS: (choose at least two) Average or above average intelligence Capable of independent living Communication skills  PATIENT STRESSORS:    PROBLEM LIST: Problem List/Patient Goals Date to be addressed Date deferred Reason deferred Estimated date of resolution  Depression 10/16/2013     Anxiety 10/16/2013                                                DISCHARGE CRITERIA:  Ability to meet basic life and health needs Improved stabilization in mood, thinking, and/or behavior Verbal commitment to aftercare and medication compliance  PRELIMINARY DISCHARGE PLAN: Outpatient therapy Return to previous living arrangement  PATIENT/FAMIILY INVOLVEMENT: This treatment plan has been presented to and reviewed with the patient, Heather Gray. The patient has been given the opportunity to ask questions and make suggestions.  Carrolyn LeighRhodes, Saryah Loper W 10/16/2013, 10:15 PM

## 2013-10-16 NOTE — Consult Note (Signed)
Diagnosis: Major depressive disorder, recurrent severe. Saw patient with Dr Gilmore LarocheAkhtar who was tearful and felt hopeless, helpless and worthless.  Patient brought self voluntarily for depressive mood and increased stressors at home.  Patient states she does not feel safe going home because she may end up hurting her self.  We have accepted patient for admission and will be transferring her soon. Dahlia ByesJosephine Onuoha   PMHNP-BC I have personally seen the patient and agreed with the findings and involved in the treatment plan. Thresa RossNadeem Jasleen Riepe, MD

## 2013-10-16 NOTE — ED Notes (Signed)
Pt in scubs wanded by security jeans red coat blue sweatshirt brown shoes bra pink shirt

## 2013-10-16 NOTE — Progress Notes (Signed)
   CARE MANAGEMENT ED NOTE 10/16/2013  Patient:  Heather Gray,Heather Gray   Account Number:  [id number]  Date Initiated:  10/16/2013  Documentation initiated by:  Radford PaxFERRERO,Tilly Pernice  Subjective/Objective Assessment:   Patient presents to Ed with worsening depression and suicidal thoughts.     Subjective/Objective Assessment Detail:   Patient with pmhx of deprssion, HTN, and hyperthyroid.     Action/Plan:   Action/Plan Detail:   Anticipated DC Date:       Status Recommendation to Physician:   Result of Recommendation:    Other ED Services  Consult Working Plan    DC Planning Services  Other  PCP issues    Choice offered to / List presented to:            Status of service:  Completed, signed off  ED Comments:   ED Comments Detail:  Patient confirms her pcp is [facility]allings [facility].  System updated.
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
## 2013-10-17 ENCOUNTER — Encounter (HOSPITAL_COMMUNITY): Payer: Self-pay | Admitting: Internal Medicine

## 2013-10-17 DIAGNOSIS — F332 Major depressive disorder, recurrent severe without psychotic features: Principal | ICD-10-CM

## 2013-10-17 LAB — TSH: TSH: <0.008 u[IU]/mL — ABNORMAL LOW (ref 0.350–4.500)

## 2013-10-17 NOTE — [facility] Group Notes (Signed)
[facility] Group Notes:  (Clinical Social Work)  10/17/2013   1:15-2:15PM  Summary of Progress/Problems:   The main focus of today's process group was for the patient to identify ways in which they have sabotaged their own mental health wellness/recovery.  Motivational interviewing and a handout were used to explore the benefits and costs of their self-sabotaging behavior as well as the benefits and costs of changing this behavior.  The Stages of Change were explained to the group using a handout, and patients identified where they are with regard to changing self-defeating behaviors.  The patient expressed she self-sabotages with black and white thinking, and explained further that this is mostly in relation to family members.  She stated that if in fact she changes and lets family members "in", she could lose her home and money.  Later while doing the Psychosocial Assessment this was explained further.2 (see PSA)  Type of Therapy:  Process Group  Participation Level:  Active  Participation Quality:  Attentive and Sharing  Affect:  Blunted and Depressed  Cognitive:  Alert, Appropriate and Oriented  Insight:  Developing/Improving  Engagement in Therapy:  Engaged  Modes of Intervention:  Education, Motivational Interviewing   Ambrose MantleMareida Grossman-Orr, LCSW 10/17/2013, 4:00pm

## 2013-10-17 NOTE — Progress Notes (Signed)
Adult Psychoeducational Group Note  Date:  10/17/2013 Time:  9:35 PM  Group Topic/Focus:  Wrap-Up Group:   The focus of this group is to help patients review their daily goal of treatment and discuss progress on daily workbooks.  Participation Level:  Active  Participation Quality:  Appropriate  Affect:  Appropriate  Cognitive:  Appropriate  Insight: Appropriate  Engagement in Group:  Engaged  Modes of Intervention:  Discussion  Additional Comments: The patient expressed that she learn to think more about herself and not worry less about others.  Octavio Mannshigpen, Brantlee Hinde Lee 10/17/2013, 9:35 PM

## 2013-10-17 NOTE — Progress Notes (Signed)
D: Writer introduced self to pt. Pt stated that she felt overwhelmed because she have never been to a place like this  before. Pt denies SI at this time. Per pt, her ex husband is the one who took her to the hosp for help. Pt requested Ambien for sleep tonight. A: medications administered as ordered per MD. Prn med given at pt's request. Verbal support given. Pt encouraged to attend groups. 15 minute checks performed for safety. R: Pt safety maintained.

## 2013-10-17 NOTE — Progress Notes (Signed)
D pt is seen OOB UAL on the 500 hall today...tolerated fair. She is quiet. She does not initiate conversation with others.    A She attends her groups and is visibly quite engaged in the discussion . She says " I've been thinking that all along...." She is compliant with her meds but does not complete her self inventory  This morning, per routine.    R Safey is in place and poc includes continuing to offer feedback and support pt AMAP.
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
## 2013-10-17 NOTE — Progress Notes (Signed)
.  Psychoeducational Group Note    Date: 10/17/2013 Time:  0930   Goal Setting Purpose of Group: To be able to set a goal that is measurable and that can be accomplished in one day Participation Level:  Active  Participation Quality:  Appropriate  Affect:  Appropriate  Cognitive:  Oriented  Insight:  Improving  Engagement in Group:  Improving  Additional Comments:    Shamarie Call A  

## 2013-10-17 NOTE — Progress Notes (Signed)
Psychoeducational Group Note  Date: 10/17/2013 Time:  1015  Group Topic/Focus:  Identifying Needs:   The focus of this group is to help patients identify their personal needs that have been historically problematic and identify healthy behaviors to address their needs.  Participation Level:  Active  Participation Quality:  Appropriate  Affect:  Appropriate  Cognitive:  Oriented  Insight:  Improving  Engagement in Group:  Engaged  Additional Comments:  Attended and participated in the group  Jericha Bryden A  

## 2013-10-17 NOTE — Progress Notes (Signed)
D:  Pt reports decreased anxiety, denies SI/HI/AH/VH A:  Pt has attended groups, visited with ex-husband,developed a plan for discharge, pt is medication compliant, pt offered opportunity to ask questions R: Pt observation level is maintained at every 15 minutes for safety, verbal encouragement offered, no questions or concerns discussed at this time. Will continue to monitor

## 2013-10-17 NOTE — [facility] Suicide Risk Assessment (Signed)
Suicide Risk Assessment  Admission Assessment     Nursing information obtained from:    Demographic factors:    Current Mental Status:    Loss Factors:    Historical Factors:    Risk Reduction Factors:    Total Time spent with patient: 45 minutes  CLINICAL FACTORS:   Depression:   Impulsivity Insomnia Severe  Psychiatric Specialty Exam:     Blood pressure 151/88, pulse 61, temperature 97 F (36.1 C), temperature source Oral, resp. rate 18, height 5\' 3"  (1.6 m), weight 68.04 kg (150 lb).Body mass index is 26.58 kg/(m^2).  General Appearance: Fairly Groomed  Patent attorneyye Contact::  Fair  Speech:  Clear and Coherent  Volume:  fluctuates  Mood:  Anxious, Depressed and worried  Affect:  anxious, depressed,worried  Thought Process:  Coherent and Goal Directed  Orientation:  Full (Time, Place, and Person)  Thought Content:  events, symptoms, worries and concerns a sense of helplessness  Suicidal Thoughts:  No  Homicidal Thoughts:  No  Memory:  Immediate;   Fair Recent;   Fair Remote;   Fair  Judgement:  Fair  Insight:  Present  Psychomotor Activity:  Restlessness  Concentration:  Fair  Recall:  [employer]is[employer]Fair  Fund of Knowledge:Fair  Language: Fair  Akathisia:  No  Handed:    AIMS (if indicated):     Assets:  Desire for Improvement  Sleep:  Number of Hours: 6   Musculoskeletal: Strength & Muscle Tone: within normal limits Gait & Station: normal Patient leans: N/A  COGNITIVE FEATURES THAT CONTRIBUTE TO RISK:  Closed-mindedness Polarized thinking Thought constriction (tunnel vision)    SUICIDE RISK:   Moderate:  Frequent suicidal ideation with limited intensity, and duration, some specificity in terms of plans, no associated intent, good self-control, limited dysphoria/symptomatology, some risk factors present, and identifiable protective factors, including available and accessible social support.  PLAN OF CARE: Supportive approach/coping skills  CBT;mindfulness                               Optimize treatment with psychotropics  I certify that inpatient services furnished can reasonably be expected to improve the patient's condition.  Shabree Tebbetts A 10/17/2013, 2:23 PM

## 2013-10-18 LAB — T4, FREE: Free T4: 2.38 ng/dL — ABNORMAL HIGH (ref 0.80–1.80)

## 2013-10-18 MED ORDER — ENSURE COMPLETE PO LIQD
237.0000 mL | Freq: Two times a day (BID) | ORAL | Status: DC
  Administered 2013-10-18 – 2013-10-21 (×6): 237 mL via ORAL

## 2013-10-18 NOTE — Clinical Social Work Psych Assess (Addendum)
Heather Gray is seen O[facility] on the   500 hall today, tolerated well. She is pleasant and cooperative and is seen frequently laughing and joking with the other patients as well as the staff.    A She takes her meds as ordered and she attends her groups as planned. She completes her morning self inventory and on it she writes she denies SI within the past 24 hrs, she rates her depression as a " 3 " and says her DC plan is to " stay on track with  My meds, find a counselor and try to remove negative things from my life".    R Safety is in place and poc moves forward.

## 2013-10-18 NOTE — Progress Notes (Signed)
Adult Psychoeducational Group Note  Date:  10/18/2013 Time:  11:20 PM  Group Topic/Focus:  Wrap-Up Group:   The focus of this group is to help patients review their daily goal of treatment and discuss progress on daily workbooks.  Participation Level:  Minimal  Participation Quality:  Appropriate  Affect:  Appropriate  Cognitive:  Appropriate  Insight: Limited  Engagement in Group:  Limited  Modes of Intervention:  Support  Additional Comments:  Pt mentioned that she liked laughing with her peers and enjoyed being able to leave the unit for recreation  Jola Baptistbdin, Pamalee Marcoe 10/18/2013, 11:20 PM

## 2013-10-18 NOTE — Progress Notes (Addendum)
Nutrition Brief Note Patient identified on the Malnutrition Screening Tool (MST) Report.  Wt Readings from Last 10 Encounters:  10/16/13 150 lb (68.04 kg)  10/16/13 165 lb (74.844 kg)   Body mass index is 26.58 kg/(m^2). Patient meets criteria for overweight based on current BMI.   Discussed intake PTA with patient and compared to intake presently.  Discussed changes in intake, if any, and encouraged adequate intake of meals and snacks. Current diet order is regular and pt is also offered choice of unit snacks mid-morning and mid-afternoon.  Pt is eating as desired.   Labs and medications reviewed.   Nutrition Dx:  Unintended wt change r/t suboptimal oral intake AEB pt report  Pt reports that she has lost ~90 lbs in the past 6-7 months from issues with her thyroid. She says that her doctor is working with her to resolve these issues. She says that she has had a good appetite, and has been eating well since [facility]. She said that she would try Ensure Complete to slow weight loss and to supplement her diet with calories and protein.   Interventions:   Discussed the importance of nutrition and encouraged intake of food and beverages.     Discussed weight goals with patient.   Supplements: Ensure Complete po BID, each supplement provides 350 kcal and 13 grams of protein    No additional nutrition interventions warranted at this time. If nutrition issues arise, please consult RD.   Ebbie LatusHaley Hawkins RD, LDN

## 2013-10-18 NOTE — [facility] Group Notes (Signed)
[facility] Group Notes:  (Clinical Social Work)  10/18/2013   1:15-2:15PM  Summary of Progress/Problems:  The main focus of today's process group was to   identify the patient's current support system and decide on other supports that can be put in place.  The picture on workbook was used to discuss why additional supports are needed.  An emphasis was placed on using counselor, doctor, therapy groups, 12-step groups, and problem-specific support groups to expand supports.   There was also an extensive discussion about what constitutes a healthy support versus an unhealthy support.  The patient was late to group, sat and listened attentively but did not participate actively.  Type of Therapy:  Process Group  Participation Level:  Minimal  Participation Quality:  Attentive   Affect:  Blunted and Depressed  Cognitive:  Appropriate and Oriented  Insight:  Developing/Improving  Engagement in Therapy:  Engaged  Modes of Intervention:  Education,  Support and ConAgra FoodsProcessing  Afnan Emberton Grossman-Orr, LCSW 10/18/2013, 4:00pm

## 2013-10-18 NOTE — Progress Notes (Signed)
Adult Psychoeducational Group Note  Date:  10/18/2013 Time:  3:05PM  Group Topic/Focus:  Making Healthy Choices:   The focus of this group is to help patients identify negative/unhealthy choices they were using prior to admission and identify positive/healthier coping strategies to replace them upon discharge.  Participation Level:  Active  Participation Quality:  Appropriate  Affect:  Appropriate  Cognitive:  Appropriate  Insight: Appropriate  Engagement in Group:  Engaged  Modes of Intervention:  Discussion  Additional Comments:  Pt was attentive throughout group   Jiaire Rosebrook K 10/18/2013, 3:20 PM

## 2013-10-18 NOTE — Progress Notes (Signed)
Psychoeducational Group Note  Date:  10/18/2013 Time:  1015  Group Topic/Focus:  Making Healthy Choices:   The focus of this group is to help patients identify negative/unhealthy choices they were using prior to admission and identify positive/healthier coping strategies to replace them upon discharge.  Participation Level:  Active  Participation Quality:  Appropriate  Affect:  appropriate Cognitive:  Oriented  Insight:  Improving  Engagement in Group:  Engaged  Additional Comments:   Heather Gray A 10/18/2013  

## 2013-10-18 NOTE — Progress Notes (Signed)
[facility] MD Progress Note  10/18/2013 3:40 PM Heather Gray  MRN:  [id number] Subjective:  Patient reports "feeling good" today.  Her appetite and sleep are good.  Rate depression 5/10 and anxiety 3/10.  She got up and took a shower and is attending  Groups; she feels the groups are helping her see "things more clearly"  She denies suicidal ideations.   She is hopeful for the future Diagnosis:   DSM5: Substance/Addictive Disorders:  Cannabis Use Disorder - Moderate 9304.30) Depressive Disorders:  Major Depressive Disorder - Severe (296.23) Total Time spent with patient: 30 minutes  Axis I: Major Depression, Recurrent severe Axis II: Deferred Axis III:  Past Medical History  Diagnosis Date  . Depression   . Hypertension   . Thyroid disease     hyperthyroid  . Migraine    Axis IV: housing problems, other psychosocial or environmental problems, problems related to social environment and problems with primary support group Axis V: 31-40 impairment in reality testing  ADL's:  Intact  Sleep: Good  Appetite:  Good  Suicidal Ideation:  -denies presently  Homicidal Ideation: -denies presently  AEB (as evidenced by):  Psychiatric Specialty Exam: Physical Exam  Constitutional: She is oriented to person, place, and time. She appears well-developed and well-nourished.  HENT:  Head: Normocephalic and atraumatic.  Neck: Normal range of motion. Neck supple.  Respiratory: Effort normal.  Musculoskeletal: Normal range of motion.  Neurological: She is alert and oriented to person, place, and time.  Skin: Skin is warm and dry.    ROS  Blood pressure 122/80, pulse 69, temperature 98.9 F (37.2 C), temperature source Oral, resp. rate 24, height 5\' 3"  (1.6 m), weight 68.04 kg (150 lb).Body mass index is 26.58 kg/(m^2).  General Appearance: Casual  Eye Contact::  Good  Speech:  Normal Rate  Volume:  Normal  Mood:  Euthymic  Affect:  Congruent  Thought Process:  Negative  Orientation:  Full  (Time, Place, and Person)  Thought Content:  Negative  Suicidal Thoughts:  No  Homicidal Thoughts:  No  Memory:  Immediate;   Good  Judgement:  Fair  Insight:  Fair  Psychomotor Activity:  Normal  Concentration:  Good  Recall:  Good  Fund of Knowledge:Good  Language: Good  Akathisia:  NA  Handed:  Right  AIMS (if indicated):     Assets:  Communication Skills Desire for Improvement Financial Resources/Insurance Physical Health Resilience  Sleep:  Number of Hours: 6.25   Musculoskeletal: Strength & Muscle Tone: within normal limits Gait & Station: normal Patient leans: N/A  Current Medications: Current Facility-Administered Medications  Medication Dose Route Frequency Provider Last Rate Last Dose  . acetaminophen (TYLENOL) tablet 650 mg  650 mg Oral Q4H PRN Earney Navy, NP      . acetaminophen (TYLENOL) tablet 650 mg  650 mg Oral Q6H PRN Earney Navy, NP      . alum & mag hydroxide-simeth (MAALOX/MYLANTA) 200-200-20 MG/5ML suspension 30 mL  30 mL Oral PRN Earney Navy, NP      . alum & mag hydroxide-simeth (MAALOX/MYLANTA) 200-200-20 MG/5ML suspension 30 mL  30 mL Oral Q4H PRN Earney Navy, NP      . feeding supplement (ENSURE COMPLETE) (ENSURE COMPLETE) liquid 237 mL  237 mL Oral BID BM Earna Coder, RD      . FLUoxetine (PROZAC) capsule 40 mg  40 mg Oral QPM Earney Navy, NP   40 mg at 10/17/13 4098  .  ibuprofen (ADVIL,MOTRIN) tablet 600 mg  600 mg Oral Q8H PRN Earney NavyJosephine C Onuoha, NP      . losartan (COZAAR) tablet 100 mg  100 mg Oral Daily Earney NavyJosephine C Onuoha, NP   100 mg at 10/18/13 0836  . magnesium hydroxide (MILK OF MAGNESIA) suspension 30 mL  30 mL Oral Daily PRN Earney NavyJosephine C Onuoha, NP      . methimazole (TAPAZOLE) tablet 15 mg  15 mg Oral Daily Earney NavyJosephine C Onuoha, NP   15 mg at 10/18/13 0836  . metoprolol succinate (TOPROL-XL) 24 hr tablet 100 mg  100 mg Oral QPM Earney NavyJosephine C Onuoha, NP   100 mg at 10/17/13 [id number]  . ondansetron (ZOFRAN)  tablet 4 mg  4 mg Oral Q8H PRN Earney NavyJosephine C Onuoha, NP      . topiramate (TOPAMAX) tablet 100 mg  100 mg Oral QHS Earney NavyJosephine C Onuoha, NP   100 mg at 10/17/13 2128  . zolpidem (AMBIEN) tablet 10 mg  10 mg Oral QHS PRN Earney NavyJosephine C Onuoha, NP   10 mg at 10/17/13 2131    Lab Results:  Results for orders placed during the hospital encounter of 10/16/13 (from the past 48 hour(s))  TSH     Status: Abnormal   Collection Time    10/17/13  6:39 AM      Result Value Ref Range   TSH <0.008 (*) 0.350 - 4.500 uIU/mL   Comment: Performed at [facility]  T4, FREE     Status: Abnormal   Collection Time    10/17/13  7:40 PM      Result Value Ref Range   Free T4 2.38 (*) 0.80 - 1.80 ng/dL   Comment: Performed at [facility]    Physical Findings: AIMS: Facial and Oral Movements Muscles of Facial Expression: None, normal Lips and Perioral Area: None, normal Jaw: None, normal Tongue: None, normal,Extremity Movements Upper (arms, wrists, hands, fingers): None, normal Lower (legs, knees, ankles, toes): None, normal, Trunk Movements Neck, shoulders, hips: None, normal, Overall Severity Severity of abnormal movements (highest score from questions above): None, normal Incapacitation due to abnormal movements: None, normal Patient's awareness of abnormal movements (rate only patient's report): No Awareness, Dental Status Current problems with teeth and/or dentures?: Yes Does patient usually wear dentures?: Yes  CIWA:    COWS:     Treatment Plan Summary: Daily contact with patient to assess and evaluate symptoms and progress in treatment Medication management  Review of chart, vital signs, medications and notes  Plan: 1. Continue crisis management and stabilization. Estimated length of stay 5-7 days  2.  Medication management to reduce current symptoms to base line and improve patient's overall level of functioning Medications reviewed with the apteint and no untoward  effects Individual and group therapy encouraged Coping skills for depression, substance abuse, and anxiety 3.  Treat health problems as indicated. 4.  Develop treatment plan to decrease risk of relapse upon discharge and the need for readmission 5.  Psych-social education regarding relapse prevention and self care. Continued discussion addressing self responsibility, choices and consequences 6.  Health care follow up as needed for medical problems 7.  Continue home medications where appropriate 8.  Disposition in progress   Medical Decision Making Problem Points:  Established problem, stable/improving (1) and Review of psycho-social stressors (1) Data Points:  Review of medication regiment & side effects (2) Review of new medications or change in dosage (2)  I certify that inpatient services furnished can reasonably be expected  to improve the patient's condition.   Lorinda Creed  PMHNP 10/18/2013, 3:40 PM Agree with assessment and plan Madie Reno A. Dub Mikes, M.D.

## 2013-10-18 NOTE — Progress Notes (Signed)
Psychoeducational Group Note  Date: 10/18/2013 Time:  0930  Group Topic/Focus:  Gratefulness:  The focus of this group is to help patients identify what two things they are most grateful for in their lives. What helps ground them and to center them on their work to their recovery.  Participation Level:  Active  Participation Quality:  Appropriate  Affect:  Appropriate  Cognitive:  Oriented  Insight:  Improving  Engagement in Group:  Engaged  Additional Comments:  Participated fully in the group  Heather Gray A   

## 2013-10-18 NOTE — Progress Notes (Signed)
[facility] MD Progress Note  10/18/2013 8:45 AM Heather Gray  MRN:  [id number] Subjective:   Participated in groups yesterday, they  "brought out a lot of important topics that I never would have thought of on my own."  The groups are very helpful.  Depression 2/10 anxiety 1/10  Reports "I have good energy today"  Anticipates going home "when I'm ready", Plans get legal assistance, and on going help from a counselor.  Is thinking about how to get herself in a better palce  With her family.   We discussed change starting from self awareness and ongoing hard work.              Diagnosis:   DSM5:  Substance/Addictive Disorders:  Cannabis Use Disorder - Moderate 9304.30) Depressive Disorders:  Major Depressive Disorder - Severe (296.23) Total Time spent with patient: 30 minutes  Axis I: Major Depression, Recurrent severe Axis II: Deferred Axis III:  Past Medical History  Diagnosis Date  . Depression   . Hypertension   . Thyroid disease     hyperthyroid  . Migraine    Axis IV: economic problems, housing problems, other psychosocial or environmental problems, problems related to legal system/crime, problems related to social environment and problems with primary support group Axis V: 31-40 impairment in reality testing  ADL's:  Intact  Sleep: Good  Appetite:  Good  Suicidal Ideation:  -Denies presently and contracts for safety her at [facility]  Homicidal Ideation:  - Denies prestnlyAEB (as evidenced by):  Psychiatric Specialty Exam: Physical Exam  ROS  Blood pressure 122/80, pulse 69, temperature 98.9 F (37.2 C), temperature source Oral, resp. rate 24, height 5\' 3"  (1.6 m), weight 68.04 kg (150 lb).Body mass index is 26.58 kg/(m^2).  General Appearance: Casual  Eye Contact::  Good  Speech:  Clear and Coherent  Volume:  Normal  Mood:  Anxious and Depressed  Affect:  Congruent  Thought Process:  Goal Directed  Orientation:  Full (Time, Place, and Person)  Thought Content:  NA   Suicidal Thoughts:  No  Homicidal Thoughts:  No  Memory:  Immediate;   Good Recent;   Good Remote;   Good  Judgement:  Fair  Insight:  Fair  Psychomotor Activity:  Normal  Concentration:  Good  Recall:  Good  Fund of Knowledge:Good  Language: Good  Akathisia:  No  Handed:  Right  AIMS (if indicated):     Assets:  Communication Skills Desire for Improvement Physical Health Resilience  Sleep:  Number of Hours: 6.25   Musculoskeletal: Strength & Muscle Tone: within normal limits Gait & Station: normal Patient leans: N/A  Current Medications: Current Facility-Administered Medications  Medication Dose Route Frequency Provider Last Rate Last Dose  . acetaminophen (TYLENOL) tablet 650 mg  650 mg Oral Q4H PRN Earney Navy, NP      . acetaminophen (TYLENOL) tablet 650 mg  650 mg Oral Q6H PRN Earney Navy, NP      . alum & mag hydroxide-simeth (MAALOX/MYLANTA) 200-200-20 MG/5ML suspension 30 mL  30 mL Oral PRN Earney Navy, NP      . alum & mag hydroxide-simeth (MAALOX/MYLANTA) 200-200-20 MG/5ML suspension 30 mL  30 mL Oral Q4H PRN Earney Navy, NP      . FLUoxetine (PROZAC) capsule 40 mg  40 mg Oral QPM Earney Navy, NP   40 mg at 10/17/13 4098  . ibuprofen (ADVIL,MOTRIN) tablet 600 mg  600 mg Oral Q8H PRN Earney Navy, NP      .  losartan (COZAAR) tablet 100 mg  100 mg Oral Daily Earney NavyJosephine C Onuoha, NP   100 mg at 10/18/13 0836  . magnesium hydroxide (MILK OF MAGNESIA) suspension 30 mL  30 mL Oral Daily PRN Earney NavyJosephine C Onuoha, NP      . methimazole (TAPAZOLE) tablet 15 mg  15 mg Oral Daily Earney NavyJosephine C Onuoha, NP   15 mg at 10/18/13 0836  . metoprolol succinate (TOPROL-XL) 24 hr tablet 100 mg  100 mg Oral QPM Earney NavyJosephine C Onuoha, NP   100 mg at 10/17/13 16101852  . ondansetron (ZOFRAN) tablet 4 mg  4 mg Oral Q8H PRN Earney NavyJosephine C Onuoha, NP      . topiramate (TOPAMAX) tablet 100 mg  100 mg Oral QHS Earney NavyJosephine C Onuoha, NP   100 mg at 10/17/13 2128  .  zolpidem (AMBIEN) tablet 10 mg  10 mg Oral QHS PRN Earney NavyJosephine C Onuoha, NP   10 mg at 10/17/13 2131    Lab Results:  Results for orders placed during the hospital encounter of 10/16/13 (from the past 48 hour(s))  TSH     Status: Abnormal   Collection Time    10/17/13  6:39 AM      Result Value Ref Range   TSH <0.008 (*) 0.350 - 4.500 uIU/mL   Comment: Performed at [facility]  T4, FREE     Status: Abnormal   Collection Time    10/17/13  7:40 PM      Result Value Ref Range   Free T4 2.38 (*) 0.80 - 1.80 ng/dL   Comment: Performed at [facility]    Physical Findings: AIMS: Facial and Oral Movements Muscles of Facial Expression: None, normal Lips and Perioral Area: None, normal Jaw: None, normal Tongue: None, normal,Extremity Movements Upper (arms, wrists, hands, fingers): None, normal Lower (legs, knees, ankles, toes): None, normal, Trunk Movements Neck, shoulders, hips: None, normal, Overall Severity Severity of abnormal movements (highest score from questions above): None, normal Incapacitation due to abnormal movements: None, normal Patient's awareness of abnormal movements (rate only patient's report): No Awareness, Dental Status Current problems with teeth and/or dentures?: Yes Does patient usually wear dentures?: Yes  CIWA:    COWS:     Treatment Plan Summary: Daily contact with patient to assess and evaluate symptoms and progress in treatment Medication management:   Continue with present mediations.  Side effects reviewed.  Patient reports good sleep and appetite.   Plan:  Medical Decision Making Problem Points:  Established problem, stable/improving (1) and Review of psycho-social stressors (1) Data Points:  Review or order clinical lab tests (1) Review of medication regiment & side effects (2)  Discussed abnormal lab values (T4 and TSH) with patient.  She is [facility] [place].  Her medictionss were adjusted 4 weeks  ago.  I instructed her to contact her specialist on Monday and make and appointment for 2 week, an appropriate time for further adjustment for thyroid medications.  No adjustments here today.   I certify that inpatient services furnished can reasonably be expected to improve the patient's condition.   Lorinda CreedLARACH, MARY    PMHNP 10/18/2013, 8:45 AM

## 2013-10-19 NOTE — Progress Notes (Signed)
Patient ID: FAIGY STRETCH, female   DOB: Dec 03, 1950, 63 y.o.   MRN: [id number] [facility] MD Progress Note  10/19/2013 6:32 PM Heather Gray  MRN:  [id number] Subjective:   During today's assessment, pt is minimizing anxiety and depression symptoms. Pt states she is much "better today" vs. Yesterday. Pt affirms participation in group sessions and states that these are beneficial. Pt does have some rumination about her legal situation and impending potential eviction from the home with her family members. Pt states that she is learning coping strategies and how to control her thoughts better before they build up and lead to her feelings of depression. Pt denies SI, HI, and AVH, contracts for safety. Pt is in agreement with medication and treatment plan.   Diagnosis:   DSM5:  Substance/Addictive Disorders:  Cannabis Use Disorder - Moderate 9304.30) Depressive Disorders:  Major Depressive Disorder - Severe (296.23) Total Time spent with patient: 30 minutes  Axis I: Major Depression, Recurrent severe Axis II: Deferred Axis III:  Past Medical History  Diagnosis Date  . Depression   . Hypertension   . Thyroid disease     hyperthyroid  . Migraine    Axis IV: economic problems, housing problems, other psychosocial or environmental problems, problems related to legal system/crime, problems related to social environment and problems with primary support group Axis V: 51-60 moderate symptoms  ADL's:  Intact  Sleep: Good  Appetite:  Good  Suicidal Ideation:  Denies  Homicidal Ideation:  -Denies  Psychiatric Specialty Exam: Physical Exam  Review of Systems  Constitutional: Negative.   HENT: Negative.   Eyes: Negative.   Respiratory: Negative.   Cardiovascular: Negative.   Gastrointestinal: Negative.   Genitourinary: Negative.   Musculoskeletal: Negative.   Skin: Negative.   Neurological: Negative.   Endo/Heme/Allergies: Negative.   Psychiatric/Behavioral: Positive for depression. Negative for  suicidal ideas and hallucinations. The patient is nervous/anxious. The patient does not have insomnia.     Blood pressure 159/81, pulse 64, temperature 97.5 F (36.4 C), temperature source Oral, resp. rate 20, height 5\' 3"  (1.6 m), weight 68.04 kg (150 lb).Body mass index is 26.58 kg/(m^2).  General Appearance: Casual  Eye Contact::  Good  Speech:  Clear and Coherent  Volume:  Normal  Mood:  Euthymic  Affect:  Congruent  Thought Process:  Goal Directed  Orientation:  Full (Time, Place, and Person)  Thought Content:  NA  Suicidal Thoughts:  No  Homicidal Thoughts:  No  Memory:  Immediate;   Good Recent;   Good Remote;   Good  Judgement:  Fair  Insight:  Fair  Psychomotor Activity:  Normal  Concentration:  Good  Recall:  Good  Fund of Knowledge:Good  Language: Good  Akathisia:  No  Handed:  Right  AIMS (if indicated):     Assets:  Communication Skills Desire for Improvement Physical Health Resilience  Sleep:  Number of Hours: 6.25   Musculoskeletal: Strength & Muscle Tone: within normal limits Gait & Station: normal Patient leans: N/A  Current Medications: Current Facility-Administered Medications  Medication Dose Route Frequency Provider Last Rate Last Dose  . acetaminophen (TYLENOL) tablet 650 mg  650 mg Oral Q4H PRN Earney Navy, NP      . acetaminophen (TYLENOL) tablet 650 mg  650 mg Oral Q6H PRN Earney Navy, NP      . alum & mag hydroxide-simeth (MAALOX/MYLANTA) 200-200-20 MG/5ML suspension 30 mL  30 mL Oral PRN Earney Navy, NP      .  alum & mag hydroxide-simeth (MAALOX/MYLANTA) 200-200-20 MG/5ML suspension 30 mL  30 mL Oral Q4H PRN Earney NavyJosephine C Onuoha, NP      . feeding supplement (ENSURE COMPLETE) (ENSURE COMPLETE) liquid 237 mL  237 mL Oral BID BM Earna CoderHaley A Hawkins, RD   237 mL at 10/19/13 1537  . FLUoxetine (PROZAC) capsule 40 mg  40 mg Oral QPM Earney NavyJosephine C Onuoha, NP   40 mg at 10/19/13 1818  . ibuprofen (ADVIL,MOTRIN) tablet 600 mg  600 mg  Oral Q8H PRN Earney NavyJosephine C Onuoha, NP      . losartan (COZAAR) tablet 100 mg  100 mg Oral Daily Earney NavyJosephine C Onuoha, NP   100 mg at 10/19/13 16100823  . magnesium hydroxide (MILK OF MAGNESIA) suspension 30 mL  30 mL Oral Daily PRN Earney NavyJosephine C Onuoha, NP      . methimazole (TAPAZOLE) tablet 15 mg  15 mg Oral Daily Earney NavyJosephine C Onuoha, NP   15 mg at 10/19/13 [id number]  . metoprolol succinate (TOPROL-XL) 24 hr tablet 100 mg  100 mg Oral QPM Earney NavyJosephine C Onuoha, NP   100 mg at 10/19/13 1818  . ondansetron (ZOFRAN) tablet 4 mg  4 mg Oral Q8H PRN Earney NavyJosephine C Onuoha, NP      . topiramate (TOPAMAX) tablet 100 mg  100 mg Oral QHS Earney NavyJosephine C Onuoha, NP   100 mg at 10/18/13 2113  . zolpidem (AMBIEN) tablet 10 mg  10 mg Oral QHS PRN Earney NavyJosephine C Onuoha, NP   10 mg at 10/18/13 2114    Lab Results:  Results for orders placed during the hospital encounter of 10/16/13 (from the past 48 hour(s))  T4, FREE     Status: Abnormal   Collection Time    10/17/13  7:40 PM      Result Value Ref Range   Free T4 2.38 (*) 0.80 - 1.80 ng/dL   Comment: Performed at [facility]    Physical Findings: AIMS: Facial and Oral Movements Muscles of Facial Expression: None, normal Lips and Perioral Area: None, normal Jaw: None, normal Tongue: None, normal,Extremity Movements Upper (arms, wrists, hands, fingers): None, normal Lower (legs, knees, ankles, toes): None, normal, Trunk Movements Neck, shoulders, hips: None, normal, Overall Severity Severity of abnormal movements (highest score from questions above): None, normal Incapacitation due to abnormal movements: None, normal Patient's awareness of abnormal movements (rate only patient's report): No Awareness, Dental Status Current problems with teeth and/or dentures?: Yes Does patient usually wear dentures?: Yes  CIWA:    COWS:     Treatment Plan Summary: Daily contact with patient to assess and evaluate symptoms and progress in treatment Medication management:    Continue with present mediations.  Side effects reviewed.  Patient reports good sleep and appetite.   Plan: Review of chart, vital signs, medications, and notes.  1-Individual and group therapy  2-Medication management for depression and anxiety: Medications reviewed with the patient and she stated no untoward effects, unchanged. 3-Coping skills for depression, anxiety  4-Continue crisis stabilization and management  5-Address health issues--monitoring vital signs, stable  6-Treatment plan in progress to prevent relapse of depression and anxiety  Medical Decision Making Problem Points:  Established problem, stable/improving (1) and Review of psycho-social stressors (1) Data Points:  Review or order clinical lab tests (1) Review of medication regiment & side effects (2)  NP discussed abnormal lab values (T4 and TSH) with patient.  She is [facility] [place].  Her medications were adjusted 4  weeks ago.  I instructed her to contact her specialist on Monday and make and appointment for 2 week, an appropriate time for further adjustment for thyroid medications.  No adjustments here today.   I certify that inpatient services furnished can reasonably be expected to improve the patient's condition.   Britini, Garcilazo, FNP-BC 10/19/2013, 6:32 PM  Reviewed the information documented and agree with the treatment plan.  Prisma Decarlo,JANARDHAHA R. 10/20/2013 2:53 PM

## 2013-10-19 NOTE — [facility] Group Notes (Signed)
[facility] LCSW Group Therapy          Overcoming Obstacles       1:15 -2:30        10/19/2013       Type of Therapy:  Group Therapy  Participation Level:  Appropriate  Participation Quality:  Appropriate  Affect:  Appropriate, Alert  Cognitive:  Attentive Appropriate  Insight: Developing/Improving Engaged  Engagement in Therapy: Developing/Imprvoing Engaged  Modes of Intervention:  Discussion Exploration  Education Rapport BuildingProblem-Solving Support  Summary of Progress/Problems:  The main focus of today's group was overcoming obstacles.  She shared the obstacle she has to overcome is family problems.  She also stated she has to stop using THC as a way of self medicating or coping with problems.     Patient able to identify appropriate coping skills.   Heather Gray, Heather Gray 10/19/2013

## 2013-10-19 NOTE — Progress Notes (Signed)
Adult Psychoeducational Group Note  Date:  10/19/2013 Time:  9:57 PM  Group Topic/Focus:  Wrap-Up Group:   The focus of this group is to help patients review their daily goal of treatment and discuss progress on daily workbooks.  Participation Level:  Active  Participation Quality:  Appropriate  Affect:  Appropriate  Cognitive:  Appropriate  Insight: Appropriate  Engagement in Group:  Engaged  Modes of Intervention:  Support  Additional Comments:  Pt stated that one positive thing that happened today was that she was able to learn how to play volleyball> pt states that she plans to stay active even after she leaves the hospital. Pt was given support to stick to her active plan.  Thurman Sarver 10/19/2013, 9:57 PM

## 2013-10-19 NOTE — Progress Notes (Signed)
Patient ID: Heather Gray, female   DOB: Feb 02, 1951, 63 y.o.   MRN: [id number] D: Pt. Visible on unit, in dayroom interacting and in hall. Pt. Reports day was "going good" "no anxiety, no depression, I'm going to get me a counselor when I get out of here and go to support groups" Pt. Reports groups been helpful "best group"  Pt. Reports chaplin talked about grief "I thought it was just when somebody died, but it could be grief over a loss"  "I going back home when I'm discharged, just have to deal with things as they come" "I maybe separate from my daughter cause of it and that's going to hurt" A: Writer introduced self to client and encouraged her to use positive coping skills. Staff will monitor q1115min for safety. R: Pt. Is safe on the unit. Pt. Attended group.

## 2013-10-19 NOTE — Progress Notes (Signed)
Patient ID: Heather Gray, female   DOB: 04-07-1951, 63 y.o.   MRN: [id number] D- Patient reports she slept fair and her appetite is good.  Her energy level is normal and her ability to pay attention is good.  She is rating depression at 2/10 and denies hopelessness.  She denies thoughts of self harm.  A- supported patient.  R- patient says when she goes home she plans to get counseling, and "be more of a stop listening to others person and seek legal advice."

## 2013-10-19 NOTE — [facility] Group Notes (Signed)
[facility]   10/19/2013 12:54 PM    Participation Quality:  Appropraite  Mood/Affect:  Appropriate  Depression Rating:  2  Anxiety Rating:  1  Thoughts of Suicide:  No  Will you contract for safety?   NA  Current AVH:  No  Plan for Discharge/Comments:  Patient attended discharge planning group and actively participated in group.  She advised of not having an outpatient provider and needing a referral.  She is also uncertain where she will live at discharge. CSW provided all participants with daily workbook.   Transportation Means: Patient has transportation.   Supports:  Patient has a support system.   Chamika Cunanan, Joesph JulyQuylle Hairston

## 2013-10-19 NOTE — Tx Team (Signed)
Interdisciplinary Treatment Plan Update   Date Reviewed:  10/19/2013  Time Reviewed:  9:41 AM  Progress in Treatment:   Attending groups: Yes Participating in groups: Yes Taking medication as prescribed: Yes  Tolerating medication: Yes Family/Significant other contact made:  No, but will ask patient for consent for collateral contact Patient understands diagnosis: Yes  Discussing patient identified problems/goals with staff: Yes Medical problems stabilized or resolved: Yes Denies suicidal/homicidal ideation: Yes Patient has not harmed self or others: Yes  For review of initial/current patient goals, please see plan of care.  Estimated Length of Stay:  2-3  Reasons for Continued Hospitalization:  Anxiety Depression Medication stabilization  New Problems/Goals identified:    Discharge Plan or Barriers:   Home with outpatient follow up to be determined  Additional Comments:  Patient is a 63 year old female with a history of hypertension, hyperthyroidism, depression who is on Prozac 40 mg for many years who presents the emergency department with worsening depression and suicidal thoughts today with a plan to burn down her house. She states because she started having a feeling she went to work and told a nurse about her thoughts and her ex-husband brought her to her primary care physician's office. Her primary care physician then called EMS to bring her to the emergency department. [facility] care is [facility]. Patient denies that she's ever had suicidal thoughts before. She denies a prior history of suicide attempts. No HI, hallucinations or delusions. No current medical complaints. She does not have a psychiatrist, therapist presently but she has participated in group and individual therapy in the past dealing with stressors after divorce She reports she is having multiple social stressors including family issues, family issues are what "pushed her to the brink". She feels  that her daughter and husband are trying to have her evicted from her home that has been built and is attached to their home. Denies any alcohol use. She does smoke marijuana occasionally.   Attendees:  Patient:  10/19/2013 9:41 AM   Signature: Mervyn GayJ. Jonnalagadda, MD 10/19/2013 9:41 AM  Signature:   10/19/2013 9:41 AM  Signature:  Claudette Headonrad Withrow, NP 10/19/2013 9:41 AM  Signature:Beverly Terrilee CroakKnight, RN 10/19/2013 9:41 AM  Signature:  Neill Loftarol Davis RN 10/19/2013 9:41 AM  Signature:  Juline PatchQuylle Rance Smithson, LCSW 10/19/2013 9:41 AM  Signature:  Reyes Ivanhelsea Horton, LCSW 10/19/2013 9:41 AM  Signature:  Leisa LenzValerie Enoch, Care Coordinator [facility] 10/19/2013 9:41 AM  Signature:   Elliot Cousinngela Thorne, RN 10/19/2013 9:41 AM  Signature:  10/19/2013  9:41 AM  Signature:   Onnie BoerJennifer Clark, RN [facility] 10/19/2013  9:41 AM  Signature:  10/19/2013  9:41 AM    Scribe for Treatment Team:   Juline PatchQuylle Mckaylie Vasey,  10/19/2013 9:41 AM

## 2013-10-20 MED ORDER — TRAZODONE HCL 50 MG PO TABS
50.0000 mg | ORAL_TABLET | Freq: Every evening | ORAL | Status: DC | PRN
  Administered 2013-10-20: 50 mg via ORAL
  Filled 2013-10-20: qty 1
  Filled 2013-10-20: qty 6

## 2013-10-20 NOTE — [facility] Group Notes (Signed)
[facility] LCSW Group Therapy      Feelings About Diagnosis 1:15 - 2:30 PM         10/20/2013  2:54 PM    Type of Therapy:  Group Therapy  Participation Level:  Active  Participation Quality:  Appropriate  Affect:  Appropriate  Cognitive:  Alert and Appropriate  Insight:  Developing/Improving and Engaged  Engagement in Therapy:  Developing/Improving and Engaged  Modes of Intervention:  Discussion, Education, Exploration, Problem-Solving, Rapport Building, Support  Summary of Progress/Problems:  Patient actively participated in group. Patient discussed past and present diagnosis and the effects it has had on  life.  Patient talked about family and society being judgmental and the stigma associated with having a mental health diagnosis.  She shares she was very ashamed of having a diagnosis of depression more than 20 years ago but has learned and accepted it as an illness.  Wynn BankerHodnett, [name] 10/20/2013  2:54 PM

## 2013-10-20 NOTE — Progress Notes (Signed)
D:  Per pt self inventory pt reports sleeping fair, appetite good, energy level normal, ability to pay attention good, rates depression at a 1 out of 10 and hopelessness at a 1 out of 10, denies SI/HI/AVH.    A:  Emotional support provided, Encouraged pt to continue with treatment plan and attend all group activities, q15 min checks maintained for safety.  R:  Pt is receptive, attending groups, calm and cooperative with staff and other patients. Pt plans to do therapy once discharged as a way to better take care of herself, she wants to find a support group so that she can talk to people who have similar problems as she does.

## 2013-10-20 NOTE — Progress Notes (Signed)
[facility] MD Progress Note  10/20/2013 12:27 PM Heather Gray  MRN:  [id number] Subjective:   Patient seen chart reviewed.  Patient is feeling better today.  She continues to have anxiety and depressive thoughts.  She admitted poor sleep and wondering if the medicine can be changed for her insomnia.  She admitted racing thoughts and gets very emotional on her circumstances which brought her to the hospital.  She admitted passive suicidal thinking denies any plan.  She does participate in the groups .  She continues to have rumination and negative thinking.  She is taking her medication and denies any side effects.  She is very concerned and worried about her eviction notice which she believed because of ongoing conflict with her daughter.  Patient denies any paranoia or any hallucination at this time.  She has no tremors or shakes. Pt is in agreement with medication and treatment plan.   Diagnosis:   DSM5:  Substance/Addictive Disorders:  Cannabis Use Disorder - Moderate 9304.30) Depressive Disorders:  Major Depressive Disorder - Severe (296.23) Total Time spent with patient: 30 minutes  Axis I: Major Depression, Recurrent severe Axis II: Deferred Axis III:  Past Medical History  Diagnosis Date  . Depression   . Hypertension   . Thyroid disease     hyperthyroid  . Migraine    Axis IV: economic problems, housing problems, other psychosocial or environmental problems, problems related to legal system/crime, problems related to social environment and problems with primary support group Axis V: 51-60 moderate symptoms  ADL's:  Intact  Sleep: Good  Appetite:  Good  Suicidal Ideation:  Denies  Homicidal Ideation:  -Denies  Psychiatric Specialty Exam: Physical Exam  Review of Systems  Constitutional: Negative.   HENT: Negative.   Eyes: Negative.   Respiratory: Negative.   Cardiovascular: Negative.   Gastrointestinal: Negative.   Genitourinary: Negative.   Musculoskeletal: Negative.    Skin: Negative.   Neurological: Negative.   Endo/Heme/Allergies: Negative.   Psychiatric/Behavioral: Positive for depression. Negative for suicidal ideas and hallucinations. The patient is nervous/anxious. The patient does not have insomnia.     Blood pressure 136/89, pulse 58, temperature 97.6 F (36.4 C), temperature source Oral, resp. rate 16, height 5\' 3"  (1.6 m), weight 68.04 kg (150 lb).Body mass index is 26.58 kg/(m^2).  General Appearance: Casual  Eye Contact::  Good  Speech:  Clear and Coherent  Volume:  Normal  Mood:  Euthymic  Affect:  Congruent  Thought Process:  Goal Directed  Orientation:  Full (Time, Place, and Person)  Thought Content:  NA  Suicidal Thoughts:  No  Homicidal Thoughts:  No  Memory:  Immediate;   Good Recent;   Good Remote;   Good  Judgement:  Fair  Insight:  Fair  Psychomotor Activity:  Normal  Concentration:  Good  Recall:  Good  Fund of Knowledge:Good  Language: Good  Akathisia:  No  Handed:  Right  AIMS (if indicated):     Assets:  Communication Skills Desire for Improvement Physical Health Resilience  Sleep:  Number of Hours: 6   Musculoskeletal: Strength & Muscle Tone: within normal limits Gait & Station: normal Patient leans: N/A  Current Medications: Current Facility-Administered Medications  Medication Dose Route Frequency Provider Last Rate Last Dose  . acetaminophen (TYLENOL) tablet 650 mg  650 mg Oral Q4H PRN Earney Navy, NP      . acetaminophen (TYLENOL) tablet 650 mg  650 mg Oral Q6H PRN Earney Navy, NP      .  alum & mag hydroxide-simeth (MAALOX/MYLANTA) 200-200-20 MG/5ML suspension 30 mL  30 mL Oral PRN Earney NavyJosephine C Onuoha, NP      . alum & mag hydroxide-simeth (MAALOX/MYLANTA) 200-200-20 MG/5ML suspension 30 mL  30 mL Oral Q4H PRN Earney NavyJosephine C Onuoha, NP      . feeding supplement (ENSURE COMPLETE) (ENSURE COMPLETE) liquid 237 mL  237 mL Oral BID BM Earna CoderHaley A Hawkins, RD   237 mL at 10/20/13 0954  . FLUoxetine  (PROZAC) capsule 40 mg  40 mg Oral QPM Earney NavyJosephine C Onuoha, NP   40 mg at 10/19/13 1818  . ibuprofen (ADVIL,MOTRIN) tablet 600 mg  600 mg Oral Q8H PRN Earney NavyJosephine C Onuoha, NP      . losartan (COZAAR) tablet 100 mg  100 mg Oral Daily Earney NavyJosephine C Onuoha, NP   100 mg at 10/20/13 0750  . magnesium hydroxide (MILK OF MAGNESIA) suspension 30 mL  30 mL Oral Daily PRN Earney NavyJosephine C Onuoha, NP      . methimazole (TAPAZOLE) tablet 15 mg  15 mg Oral Daily Earney NavyJosephine C Onuoha, NP   15 mg at 10/20/13 0750  . metoprolol succinate (TOPROL-XL) 24 hr tablet 100 mg  100 mg Oral QPM Earney NavyJosephine C Onuoha, NP   100 mg at 10/19/13 1818  . ondansetron (ZOFRAN) tablet 4 mg  4 mg Oral Q8H PRN Earney NavyJosephine C Onuoha, NP      . topiramate (TOPAMAX) tablet 100 mg  100 mg Oral QHS Earney NavyJosephine C Onuoha, NP   100 mg at 10/19/13 2152  . zolpidem (AMBIEN) tablet 10 mg  10 mg Oral QHS PRN Earney NavyJosephine C Onuoha, NP   10 mg at 10/19/13 2153    Lab Results:  No results found for this or any previous visit (from the past 48 hour(s)).  Physical Findings: AIMS: Facial and Oral Movements Muscles of Facial Expression: None, normal Lips and Perioral Area: None, normal Jaw: None, normal Tongue: None, normal,Extremity Movements Upper (arms, wrists, hands, fingers): None, normal Lower (legs, knees, ankles, toes): None, normal, Trunk Movements Neck, shoulders, hips: None, normal, Overall Severity Severity of abnormal movements (highest score from questions above): None, normal Incapacitation due to abnormal movements: None, normal Patient's awareness of abnormal movements (rate only patient's report): No Awareness, Dental Status Current problems with teeth and/or dentures?: No Does patient usually wear dentures?: No  CIWA:    COWS:     Treatment Plan Summary: Daily contact with patient to assess and evaluate symptoms and progress in treatment Medication management:   Continue with present mediations.  Side effects reviewed.  Patient reports good  sleep and appetite.   Plan: Review of chart, vital signs, medications, and notes.  1-Individual and group therapy  2-Medication management for depression, insomnia and anxiety: Medications reviewed with the patient.  I will discontinue Ambien and try trazodone for insomnia.   3-Coping skills for depression, anxiety  4-Continue crisis stabilization and management  5-Address health issues--monitoring vital signs, stable  6-Treatment plan in progress to prevent relapse of depression and anxiety  Medical Decision Making Problem Points:  Established problem, stable/improving (1) and Review of psycho-social stressors (1) Data Points:  Review of medication regiment & side effects (2)   I certify that inpatient services furnished can reasonably be expected to improve the patient's condition.   Pacen Watford T. 10/20/2013, 12:27 PM

## 2013-10-20 NOTE — [facility] Group Notes (Signed)
Adult Psychoeducational Group Note  Date:  10/20/2013 Time:  0900am  Group Topic/Focus:  Orientation:   The focus of this group is to educate the patient on the purpose and policies of crisis stabilization and provide a format to answer questions about their admission.  The group details unit policies and expectations of patients while admitted.  Participation Level:  Active  Participation Quality:  Appropriate and Attentive  Affect:  Appropriate  Cognitive:  Alert and Appropriate  Insight: Good  Engagement in Group:  Engaged  Modes of Intervention:  Discussion, Orientation and Support  Additional Comments:  Pt participated appropriately during group, was attentive and shared appropriately, pt was bright and positive.  Alfonse Sprucehorne, Tiauna Whisnant Brooke 10/20/2013, 1:28 PM

## 2013-10-20 NOTE — Progress Notes (Signed)
D - Patient smiling and joking around with staff and peers. Patient reports that she has had a great day. Patient denies SI/HI and any auditory or visual hallucinations. Patient cooperative with staff and care.  A - Offered patient therapeutic conversation. Encouraged patient to speak with staff about any questions or concerns. Medication given as ordered. Will continue to monitor patient.  R - Patient safety maintained with Q 15 minute checks. Patient remains safe on the unit.

## 2013-10-20 NOTE — Progress Notes (Signed)
Recreation Therapy Notes  Date: 02.23.2015 Time: 2:45pm Location: [address] Dayroom   Group Topic: Wellness  Goal Area(s) Addresses:  Patient will define components of whole wellness. Patient will verbalize benefit of whole wellness.  Behavioral Response: Engaged, Appropriate   Intervention: Mind Map  Activity: Patients were asked to identify and define dimensions of wellness - physical, mental, emotional, spiritual, leisure, financial, environmental, and intellectual. Patients were then asked to identify activities they can participate in to invest in each dimension of wellness.   Education: Wellness, Discharge Planning   Education Outcome: Acknowledges understanding   Clinical Observations/Feedback: Patient actively engaged in group activity, identifying and defining dimensions of wellness. Patient actively engaged in group discussion identifying positive emotions associated with investing in her wellness, patient additionally highlighted that each dimension of wellness is connected to one another.  Patient additionally identified possibility for positive change in her life if she invested in her wellness.   Heather Gray, LRT/CTRS  [name] L 10/20/2013 10:05 AM

## 2013-10-21 MED ORDER — FLUOXETINE HCL 40 MG PO CAPS: 40.0000 mg | ORAL_CAPSULE | Freq: Every evening | ORAL | Status: DC

## 2013-10-21 MED ORDER — TRAZODONE HCL 50 MG PO TABS: 50.0000 mg | ORAL_TABLET | Freq: Every evening | ORAL | Status: DC | PRN

## 2013-10-21 NOTE — [facility] Suicide Risk Assessment (Signed)
[facility] INPATIENT:  Family/Significant Other Suicide Prevention Education  Suicide Prevention Education:  Contact Attempts; Heather Gray, Husband, [phone] (cell) [phone] (Home)  has been identified by the patient as the family member/significant other with whom the patient will be residing, and identified as the person(s) who will aid the patient in the event of a mental health crisis.  With written consent from the patient, two attempts were made to provide suicide prevention education, prior to and/or following the patient's discharge.  We were unsuccessful in providing suicide prevention education.  A suicide education pamphlet was given to the patient to share with family/significant other.  Date and time of first attempt:  10/20/13 @ 3:30 PM Date and time of second attempt:  10/21/13 @ 8:45 AM  Heather Gray, Heather Gray 10/21/2013, 8:42 AM

## 2013-10-21 NOTE — [facility] Suicide Risk Assessment (Signed)
[facility] INPATIENT:  Family/Significant Other Suicide Prevention Education  Suicide Prevention Education:  Education Completed; Heather Gray, Husband, [phone];  has been identified by the patient as the family member/significant other with whom the patient will be residing, and identified as the person(s) who will aid the patient in the event of a mental health crisis (suicidal ideations/suicide attempt).  With written consent from the patient, the family member/significant other has been provided the following suicide prevention education, prior to the and/or following the discharge of the patient.  The suicide prevention education provided includes the following:  Suicide risk factors  Suicide prevention and interventions  National Suicide Hotline telephone number  [facility] assessment telephone number  [facility] Assistance 911  [facility] and/or [facility]idential [facility] telephone number  Request made of family/significant other to:  Remove weapons (e.g., guns, rifles, knives), all items previously/currently identified as safety concern.  Husband advised patient does not have access to weapons.    Remove drugs/medications (over-the-counter, prescriptions, illicit drugs), all items previously/currently identified as a safety concern.  The family member/significant other verbalizes understanding of the suicide prevention education information provided.  The family member/significant other agrees to remove the items of safety concern listed above.  Wynn BankerHodnett, Sarkis Rhines Hairston 10/21/2013, 8:46 AM

## 2013-10-21 NOTE — [facility] Suicide Risk Assessment (Signed)
Demographic Factors:  Adolescent or young adult and Caucasian  Total Time spent with patient: 30 minutes  Psychiatric Specialty Exam: Physical Exam  Nursing note and vitals reviewed. Constitutional: She is oriented to person, place, and time. She appears well-developed.  HENT:  Head: Normocephalic.  Eyes: Pupils are equal, round, and reactive to light.  Neck: Normal range of motion.  Cardiovascular: Normal rate.   Respiratory: Effort normal.  GI: Soft.  Musculoskeletal: Normal range of motion.  Neurological: She is alert and oriented to person, place, and time.  Skin: Skin is warm.    Review of Systems  Constitutional: Negative.   HENT: Negative.   Eyes: Negative.   Respiratory: Negative.   Cardiovascular: Negative.   Gastrointestinal: Negative.   Genitourinary: Negative.   Musculoskeletal: Negative.   Skin: Negative.   Neurological: Negative.   Endo/Heme/Allergies: Negative.   Psychiatric/Behavioral: Negative.   All other systems reviewed and are negative.    Blood pressure 136/81, pulse 53, temperature 98 F (36.7 C), temperature source Oral, resp. rate 18, height 5\' 3"  (1.6 m), weight 68.04 kg (150 lb).Body mass index is 26.58 kg/(m^2).  General Appearance: Fairly Groomed  Patent attorneyye Contact::  Good  Speech:  Clear and Coherent  Volume:  Normal  Mood:  Anxious  Affect:  Appropriate and Congruent  Thought Process:  Goal Directed and Intact  Orientation:  Full (Time, Place, and Person)  Thought Content:  WDL  Suicidal Thoughts:  No  Homicidal Thoughts:  No  Memory:  Immediate;   Fair  Judgement:  Good  Insight:  Fair  Psychomotor Activity:  Normal  Concentration:  Good  Recall:  Good  Fund of Knowledge:Good  Language: Good  Akathisia:  NA  Handed:  Right  AIMS (if indicated):     Assets:  Communication Skills Desire for Improvement Financial Resources/Insurance Intimacy Leisure Time Physical Health Resilience Social  Support Talents/Skills Transportation Vocational/Educational  Sleep:  Number of Hours: 6.5    Musculoskeletal: Strength & Muscle Tone: within normal limits Gait & Station: normal Patient leans: N/A   Mental Status Per Nursing Assessment::   On Admission:     Current Mental Status by Physician: NA  Loss Factors: NA  Historical Factors: Family history of mental illness or substance abuse  Risk Reduction Factors:   Sense of responsibility to family, Religious beliefs about death, Positive social support, Positive therapeutic relationship and Positive coping skills or problem solving skills  Continued Clinical Symptoms:  Depression:   Comorbid alcohol abuse/dependence Recent sense of peace/wellbeing Alcohol/Substance Abuse/Dependencies Unstable or Poor Therapeutic Relationship Previous Psychiatric Diagnoses and Treatments Medical Diagnoses and Treatments/Surgeries  Cognitive Features That Contribute To Risk:  Polarized thinking    Suicide Risk:  Minimal: No identifiable suicidal ideation.  Patients presenting with no risk factors but with morbid ruminations; may be classified as minimal risk based on the severity of the depressive symptoms  Discharge Diagnoses:   AXIS I:  Major Depression, Recurrent severe AXIS II:  Deferred AXIS III:   Past Medical History  Diagnosis Date  . Depression   . Hypertension   . Thyroid disease     hyperthyroid  . Migraine    AXIS IV:  housing problems, other psychosocial or environmental problems, problems related to social environment and problems with primary support group AXIS V:  61-70 mild symptoms  Plan Of Care/Follow-up recommendations:  Activity:  as tolerated Diet:  regular  Is patient on multiple antipsychotic therapies at discharge:  No   Has Patient had three  or more failed trials of antipsychotic monotherapy by history:  No  Recommended Plan for Multiple Antipsychotic Therapies: NA    Cliffard Hair,JANARDHAHA  R. 10/21/2013, 12:47 PM

## 2013-10-21 NOTE — Progress Notes (Signed)
Adult Psychoeducational Group Note  Date:  10/21/2013 Time: 10:00am Group Topic/Focus:  Personal Choices and Values:   The focus of this group is to help patients assess and explore the importance of values in their lives, how their values affect their decisions, how they express their values and what opposes their expression.  Participation Level:  Active  Participation Quality:  Appropriate and Attentive  Affect:  Appropriate  Cognitive:  Alert and Appropriate  Insight: Appropriate  Engagement in Group:  Engaged  Modes of Intervention:  Discussion and Education  Additional Comments:  Pt attended and participated in group. Discussion was on personal development. Pt was asked what does personal development mean to you? Pt stated personal development means set higher goals and reaching them.  Shelly BombardGarner, Kellis Mcadam D 10/21/2013, 2:08 PM

## 2013-10-21 NOTE — Tx Team (Addendum)
Interdisciplinary Treatment Plan Update   Date Reviewed:  10/21/2013  Time Reviewed:  9:45 AM  Progress in Treatment:   Attending groups: Yes Participating in groups: Yes Taking medication as prescribed: Yes  Tolerating medication: Yes Family/Significant other contact made:  Yes, collateral contact with husband Patient understands diagnosis: Yes  Discussing patient identified problems/goals with staff: Yes Medical problems stabilized or resolved: Yes Denies suicidal/homicidal ideation: Yes Patient has not harmed self or others: Yes  For review of initial/current patient goals, please see plan of care.  Estimated Length of Stay: Discharge today  Reasons for Continued Hospitalization:   New Problems/Goals identified:    Discharge Plan or Barriers:   Home [facility]  Additional Comments:    Attendees:  Patient:   Heather Gray 10/21/2013 9:45 AM   Signature: Mervyn GayJ. Jonnalagadda, MD 10/21/2013 9:45 AM  Signature:   10/21/2013 9:45 AM  Signature:  Claudette Headonrad Withrow, NP 10/21/2013 9:45 AM  Signature:    Marzetta Boardhrista Dopson, RN 10/21/2013 9:45 AM  Signature:  Harold Barbanonecia Byrd, RN 10/21/2013 9:45 AM  Signature:  Juline PatchQuylle Isam Unrein, LCSW 10/21/2013 9:45 AM  Signature:  Reyes Ivanhelsea Horton, LCSW 10/21/2013 9:45 AM  Signature:  Leisa LenzValerie Enoch, Care Coordinator [facility] 10/21/2013 9:45 AM  Signature:   10/21/2013 9:45 AM  Signature:  10/21/2013  9:45 AM  Signature:   Onnie BoerJennifer Clark, RN [facility] 10/21/2013  9:45 AM  Signature:  10/21/2013  9:45 AM    Scribe for Treatment Team:   Juline PatchQuylle Audley Hinojos,  10/21/2013 9:45 AM

## 2013-10-21 NOTE — Progress Notes (Signed)
[facility] Discharge Plan :  Will you be returning to the same living situation after discharge: Yes,  Patient to return to her home. At discharge, do you have transportation home?:Yes,  Patient to arrange for transporation home. Do you have the ability to pay for your medications:Yes,  Patient is able to afford medications.  Release of information consent forms completed and in the chart;  Patient's signature needed at discharge.  Patient to Follow up at: Follow-up Information   Follow up with Hilma FavorsMegan Blankmon, NP - [facility] On 11/24/2013. (Tuesday, November 24, 2013 at 2:00 PM.  Please arrive at 1:30 PM to complete registration)    Contact information:   [address] [place], [place]   [id number]  [phone]      Patient denies SI/HI:   Patient no longer endorsing SI/HI or other thoughts of self harm.   Safety Planning and Suicide Prevention discussed:  .Reviewed with all patients during discharge planning group   Malaiyah Achorn, Joesph JulyQuylle Hairston 10/21/2013, 10:48 AM

## 2013-10-21 NOTE — Progress Notes (Signed)
Patient ID: Heather Gray, female   DOB: 02-27-1951, 63 y.o.   MRN: [id number]  Pt. Denies SI/HI and A/V hallucinations. Patient rated his depression and hopelessness at 1/10 for the day. Scheduled medications given to patient per physician's orders. Belongings returned to patient at time of discharge. Patient denies any pain or discomfort. Discharge instructions and medications were reviewed with patient. Patient verbalized understanding of both medications and discharge instructions. Q15 minute safety checks were maintained until discharge. No distress noted upon discharge.

## 2013-10-21 NOTE — [facility] Group Notes (Signed)
[facility]   10/21/2013 10:45 AM    Participation Quality:  Appropraite  Mood/Affect:  Appropriate  Depression Rating:  1  Anxiety Rating:  0  Thoughts of Suicide:  No  Will you contract for safety?   NA  Current AVH:  No  Plan for Discharge/Comments:  Patient attended discharge planning group and actively participated in group.  [facility]  CSW provided all participants with daily workbook.   Transportation Means: Patient has transportation.   Supports:  Patient has a support system.   Heather Gray, Heather Gray

## 2013-10-21 NOTE — Progress Notes (Signed)
Recreation Therapy Notes  Date: 02.24.2015 Time: 2:45pm Location: [address] Dayroom   Group Topic: Communication, Team Building, Problem Solving  Goal Area(s) Addresses:  Patient will effectively work with peer towards shared goal.  Patient will identify skill used to make activity successful.  Patient will identify how skills used during activity can be used to reach post d/c goals.   Behavioral Response: Observation  Intervention: Problem Solving Activitiy  Activity: Life Boat. Patients were given a scenario about being on a sinking yacht. Patients were informed the yacht included 15 guest, 8 of which could be placed on the life boat, along with all group members. Individuals on guest list were of varying socioeconomic classes such as a Education officer, museumriest, Materials engineerresident Obama, MidwifeBus Driver, Tree surgeonTeacher and Chef.   Education: Pharmacist, communityocial Skills, Discharge Planning   Education Outcome: Acknowledges understanding  Clinical Observations/Feedback: Patient actively engaged in group activity, voicing her opinion and debating with peers appropriately. Patient contributed to group discussion, identifying benefit of using communication to build her support system post d/c. Patient additionally related group skills used to one another and highlighted importance of using skills in conjunction with one another.    Marykay Lexenise L Chandrea Zellman, LRT/CTRS  Edon Hoadley L 10/21/2013 1:22 PM

## 2013-10-21 NOTE — Progress Notes (Signed)
[facility] Group Notes:  (Nursing/MHT/Case Management/Adjunct)  Date:  10/21/2013  Time:  12:35 AM  Type of Therapy:  Group Therapy  Participation Level:  Active  Participation Quality:  Appropriate  Affect:  Appropriate  Cognitive:  Appropriate  Insight:  Appropriate  Engagement in Group:  Engaged  Modes of Intervention:  Socialization and Support  Summary of Progress/Problems: Pt. Had good insight on group topic of recovery.  Pt. Stated she was worried her journals would be discovered as her family members were cleaning out her home.   Pt. Stated she had plan in place to help with recovery, and was looking forward to discharge.  Sondra ComeWilson, Tinisha Etzkorn J 10/21/2013, 12:35 AM

## 2013-10-21 NOTE — Progress Notes (Signed)
Recreation Therapy Notes  Date: 02.25.2015  Time: 2:45pm Location: 500 [address] Dayroom   Group Topic: Understanding mental health   Goal Area(s) Addresses:  Patient will identify benefits to understanding mental health. Patient will work effectively with teammates.   Behavioral Response: Engaged, Attentive  Intervention: Game  Activity: Mental Health Jeopardy. Patients were divided into groups of 4 -5 patients, working as a team patients were asked to select a category of questions and a point value. Categories included: Symptoms, Medications, Causes, Coping Skills, and Substance Abuse.   Education: Wellness, Discharge Planning   Education Outcome: Acknowledges understanding  Clinical Observations/Feedback: Patient actively engaged in group activity, working well with peers. Patient assisted teammates with answering questions. Patient was asked to leave session at approximately 3:10pm early by RN to prepare for d/c.     Marykay Lexenise L Satya Buttram, LRT/CTRS  Jearl KlinefelterBlanchfield, Malayna Noori L 10/21/2013 8:41 PM

## 2013-10-23 NOTE — Progress Notes (Signed)
Patient Discharge Instructions:  After Visit Summary (AVS):   Faxed to:  10/23/13 Psychiatric Admission Assessment Note:   Faxed to:  10/23/13 Suicide Risk Assessment - Discharge Assessment:   Faxed to:  10/23/13 Faxed/Sent to the Next Level Care provider:  10/23/13 Next Level Care Provider Has Access to the EMR, 10/23/13 Faxed to [facility] @ [phone] [facility] via [facility]/Epic access.  Jerelene ReddenSheena E Shumway, 10/23/2013, 3:29 PM
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
## 2013-11-24 ENCOUNTER — Encounter (INDEPENDENT_AMBULATORY_CARE_PROVIDER_SITE_OTHER): Payer: Self-pay

## 2013-11-24 ENCOUNTER — Encounter (HOSPITAL_COMMUNITY): Payer: Self-pay | Admitting: Psychiatry

## 2013-11-24 ENCOUNTER — Ambulatory Visit (INDEPENDENT_AMBULATORY_CARE_PROVIDER_SITE_OTHER): Payer: BC Managed Care – PPO | Admitting: Psychiatry

## 2013-11-24 VITALS — BP 154/82 | HR 58 | Ht 63.0 in | Wt 163.8 lb

## 2013-11-24 DIAGNOSIS — F332 Major depressive disorder, recurrent severe without psychotic features: Secondary | ICD-10-CM

## 2013-11-24 MED ORDER — MIRTAZAPINE 15 MG PO TABS: 15.0000 mg | ORAL_TABLET | Freq: Every day | ORAL | Status: DC

## 2013-11-24 MED ORDER — FLUOXETINE HCL 40 MG PO CAPS: 40.0000 mg | ORAL_CAPSULE | Freq: Every evening | ORAL | Status: DC
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
## 2013-12-24 ENCOUNTER — Ambulatory Visit (INDEPENDENT_AMBULATORY_CARE_PROVIDER_SITE_OTHER): Payer: BC Managed Care – PPO | Admitting: Psychiatry

## 2013-12-24 ENCOUNTER — Encounter (HOSPITAL_COMMUNITY): Payer: Self-pay | Admitting: Psychiatry

## 2013-12-24 VITALS — BP 137/88 | HR 75 | Ht 63.0 in | Wt 167.6 lb

## 2013-12-24 DIAGNOSIS — F332 Major depressive disorder, recurrent severe without psychotic features: Secondary | ICD-10-CM

## 2013-12-24 DIAGNOSIS — F3130 Bipolar disorder, current episode depressed, mild or moderate severity, unspecified: Secondary | ICD-10-CM

## 2013-12-24 MED ORDER — QUETIAPINE FUMARATE ER 300 MG PO TB24: 300.0000 mg | ORAL_TABLET | Freq: Every day | ORAL | Status: DC

## 2013-12-24 MED ORDER — FLUOXETINE HCL 40 MG PO CAPS: 20.0000 mg | ORAL_CAPSULE | Freq: Every day | ORAL | Status: DC

## 2013-12-24 MED ORDER — FLUOXETINE HCL 20 MG PO CAPS: 20.0000 mg | ORAL_CAPSULE | Freq: Every day | ORAL | Status: DC

## 2013-12-24 NOTE — Progress Notes (Signed)
   [facility]  Heather Gray 1950-10-23  Date:  12/24/13 Subjective:  Patient is here for follow up Patient is on Seroquel XR 300 mg x 2 and fluoxetine 20 mg po QD. Sleeping is longer periods of time. Appetite is good. She denies SI/HI/AVH. Depression 7/10, Anxiety 4/10. Not sleeping well; she was scheduled at another providers office. The other provider changed remeron to quetiapine xr. Patient says she is doing better on this combination. Told patient about the possible Qtc interval changes with this med; patient didn't want to change the medication. Patient had enought meds for one month. Rtc in 4 weeks.   There were no vitals filed for this visit.  Mental Status Examination  Appearance: casual  Alert: Yes Attention: fair  Cooperative: Yes Eye Contact: Fair Speech: WDL  Psychomotor Activity: Psychomotor Retardation Memory/Concentration: fair  Oriented: time/date and month of year Mood: Dysphoric Affect: Restricted Thought Processes and Associations: Circumstantial Fund of Knowledge: Fair Thought Content: preoccupations Insight: Fair Judgement: Fair  Diagnosis:  Bipolar Depression Treatment Plan: Seroquel XR 300 mg, (2) Fluoxetine 20 mg po QD  Kendrick FriesBLANKMANN, Hisao Doo, NP

## 2014-01-27 ENCOUNTER — Encounter (HOSPITAL_COMMUNITY): Payer: Self-pay | Admitting: Psychiatry

## 2014-01-27 ENCOUNTER — Ambulatory Visit (INDEPENDENT_AMBULATORY_CARE_PROVIDER_SITE_OTHER): Payer: BC Managed Care – PPO | Admitting: Psychiatry

## 2014-01-27 VITALS — BP 120/80 | HR 84 | Ht 66.0 in | Wt 180.0 lb

## 2014-01-27 DIAGNOSIS — F411 Generalized anxiety disorder: Secondary | ICD-10-CM

## 2014-01-27 DIAGNOSIS — F3130 Bipolar disorder, current episode depressed, mild or moderate severity, unspecified: Secondary | ICD-10-CM

## 2014-01-27 DIAGNOSIS — F3132 Bipolar disorder, current episode depressed, moderate: Secondary | ICD-10-CM

## 2014-01-27 MED ORDER — HYDROXYZINE PAMOATE 25 MG PO CAPS: 25.0000 mg | ORAL_CAPSULE | Freq: Three times a day (TID) | ORAL | Status: DC | PRN

## 2014-01-27 MED ORDER — QUETIAPINE FUMARATE ER 300 MG PO TB24: 600.0000 mg | ORAL_TABLET | Freq: Every day | ORAL | Status: DC

## 2014-01-27 NOTE — Progress Notes (Signed)
   [facility] Follow-up Outpatient Visit  Heather Gray [birth year]  Date:  01/27/14 Subjective: Pt is for follow up  Pt has two different psychiatrist, the other provider stopped fluoxetine because of mood swings. She may have bipolar, depression, instead of unipolar depression. Pt is on Seroquel XR 300 mg po QHS. Anxiety 8/10, Depression. Tolerating the Seroquel. Pt has crying spells since coming off the fluoxetine. Pt reports she has a hard time turning off her mind, before bedtime. Discussed sleep hygiene and strategies, gave her printed materials. She wants to see a therapist here as well. She denies si/hi/avh. Rtc in 4 weeks.  There were no vitals filed for this visit.  Mental Status Examination  Appearance: casual  Alert: Yes Attention: fair  Cooperative: Yes Eye Contact: Fair Speech: wdl  Psychomotor Activity: Psychomotor Retardation Memory/Concentration: fair  Oriented: time/date, situation and day of week Mood: Anxious and Dysphoric Affect: Constricted and Depressed Thought Processes and Associations: Circumstantial Fund of Knowledge: Fair Thought Content: preoccupations Insight: Fair Judgement: Fair  Diagnosis:  Bipolar, depressed, moderate  GAD  Treatment Plan:  Rtc in 4 weeks Quetiapine XR 300 mg x 2 for mood Vistaril 25 mg tid prn anxiety/sleep   Heather Fries, NP

## 2014-03-04 ENCOUNTER — Ambulatory Visit (INDEPENDENT_AMBULATORY_CARE_PROVIDER_SITE_OTHER): Payer: BC Managed Care – PPO | Admitting: Psychiatry

## 2014-03-04 ENCOUNTER — Encounter (HOSPITAL_COMMUNITY): Payer: Self-pay | Admitting: Psychiatry

## 2014-03-04 VITALS — BP 115/89 | HR 89 | Ht 67.0 in | Wt 189.0 lb

## 2014-03-04 DIAGNOSIS — F3130 Bipolar disorder, current episode depressed, mild or moderate severity, unspecified: Secondary | ICD-10-CM | POA: Insufficient documentation

## 2014-03-04 DIAGNOSIS — F411 Generalized anxiety disorder: Secondary | ICD-10-CM

## 2014-03-04 DIAGNOSIS — F332 Major depressive disorder, recurrent severe without psychotic features: Secondary | ICD-10-CM

## 2014-03-04 MED ORDER — QUETIAPINE FUMARATE ER 300 MG PO TB24: 600.0000 mg | ORAL_TABLET | Freq: Every day | ORAL | Status: DC

## 2014-03-04 MED ORDER — HYDROXYZINE PAMOATE 25 MG PO CAPS: 50.0000 mg | ORAL_CAPSULE | Freq: Three times a day (TID) | ORAL | Status: DC | PRN

## 2014-03-04 NOTE — Progress Notes (Signed)
   [facility]  Vallery SaJo O Siddoway 04-18-1951  Date:  03/04/14  Subjective: Pt is here for follow up Sleeping is better, still fluctuates at times. Appetite is good. Mood is anxious and dysphoric. Depression 5/10, 8/10. She denies Si/hi/avh. "I'm anxious about court case." Tolerating her medications. She says she isolates herself, so she doesn't have to deal with the world. She is going to court; she has issues with her daughter. Her daughter threw her out of her house. Rtc in 4 weeks.   There were no vitals filed for this visit.  Mental Status Examination  Appearance: casual  Alert: Yes Attention: fair  Cooperative: Yes Eye Contact: Fair Speech: slow  Psychomotor Activity: Normal Memory/Concentration: fair  Oriented: time/date and situation Mood: Anxious and Dysphoric Affect: Constricted Thought Processes and Associations: Circumstantial Fund of Knowledge: Fair Thought Content: preoccupations Insight: Fair Judgement: Fair  Diagnosis:  MDD, recurrent, moderate, without psychosis Anxiety, unspecifed Treatment Plan:  Quetiapine XR 300 mg x 2 for mood Topiramate 100 mg at hs for mood/migraines-per family person Hydroxyzine 50 mg tid prn anxiety   Rtc in 4 weeks  Kendrick FriesBLANKMANN, Wannetta Langland, NP

## 2014-03-24 ENCOUNTER — Other Ambulatory Visit (HOSPITAL_COMMUNITY): Payer: Self-pay | Admitting: Psychiatry

## 2014-04-08 ENCOUNTER — Encounter (HOSPITAL_COMMUNITY): Payer: Self-pay | Admitting: Psychiatry

## 2014-04-08 ENCOUNTER — Ambulatory Visit (INDEPENDENT_AMBULATORY_CARE_PROVIDER_SITE_OTHER): Payer: BC Managed Care – PPO | Admitting: Psychiatry

## 2014-04-08 DIAGNOSIS — F411 Generalized anxiety disorder: Secondary | ICD-10-CM

## 2014-04-08 DIAGNOSIS — F3162 Bipolar disorder, current episode mixed, moderate: Secondary | ICD-10-CM

## 2014-04-08 MED ORDER — ARIPIPRAZOLE 10 MG PO TABS: 10.0000 mg | ORAL_TABLET | Freq: Every day | ORAL | Status: DC

## 2014-04-08 MED ORDER — HYDROXYZINE PAMOATE 50 MG PO CAPS: 50.0000 mg | ORAL_CAPSULE | Freq: Three times a day (TID) | ORAL | Status: DC | PRN

## 2014-04-08 NOTE — Progress Notes (Signed)
   [facility]  Heather Gray 02-13-1951  Date:   05/09/14 Subjective: Pt is here for follow up bipolar, mixed, moderate Sleeping is poor-trouble falling asleep, and staying asleep. Anxiety, around her job. Mood is irritable, dysphoric,anxious. Appetite is good. Mood is depressed, anxious. Depression 8/10, anxiety 9/10. She denies Si/hi/avh. Patient has tried prozac, and mirtazapine for depression. Dr. Mat Carnelay, in the past, thought she may be bipolar. Pt reports a week of irritability, and two weeks of depression. She is slightly pressured speech, circumstantial, flight of ideas. When pt is manic,she spends money and is expansive. Will try abilify 10 mg po daily for mood stabilization. Pt to call if any issues. Discussed RRBO of medication with patient. Rtc in 4 weeks. May add on antidepressant, if need to.   There were no vitals filed for this visit.  Mental Status Examination  Appearance: casual  Alert: Yes Attention: fair  Cooperative: Yes Eye Contact: Fair Speech: slow Psychomotor Activity: Psychomotor Retardation Memory/Concentration: fair  Oriented: time/date and day of week Mood: Anxious, Dysphoric and Irritable Affect: Depressed and Flat Thought Processes and Associations: Coherent Fund of Knowledge: Fair Thought Content: preoccupations Insight: Fair Judgement: Fair  Diagnosis:  Bipolar, mixed, moderate Anxiety, unspecified  Treatment Plan:  Abilify 10 mg daily for mood Hydroxyzine 50 mg tid prn anxiety/sleep  Kendrick FriesBLANKMANN, Margie Urbanowicz, NP

## 2014-05-13 ENCOUNTER — Encounter (HOSPITAL_COMMUNITY): Payer: Self-pay | Admitting: Psychiatry

## 2014-05-13 ENCOUNTER — Ambulatory Visit (INDEPENDENT_AMBULATORY_CARE_PROVIDER_SITE_OTHER): Payer: BC Managed Care – PPO | Admitting: Psychiatry

## 2014-05-13 VITALS — BP 141/77 | HR 80 | Ht 63.0 in | Wt 166.8 lb

## 2014-05-13 DIAGNOSIS — F3130 Bipolar disorder, current episode depressed, mild or moderate severity, unspecified: Secondary | ICD-10-CM

## 2014-05-13 DIAGNOSIS — F319 Bipolar disorder, unspecified: Secondary | ICD-10-CM

## 2014-05-13 MED ORDER — ARIPIPRAZOLE 10 MG PO TABS: 15.0000 mg | ORAL_TABLET | Freq: Every day | ORAL | Status: DC

## 2014-05-13 MED ORDER — TOPIRAMATE 100 MG PO TABS: 100.0000 mg | ORAL_TABLET | Freq: Every day | ORAL | Status: DC

## 2014-05-13 MED ORDER — HYDROXYZINE PAMOATE 50 MG PO CAPS: 50.0000 mg | ORAL_CAPSULE | Freq: Three times a day (TID) | ORAL | Status: DC | PRN

## 2014-05-13 NOTE — Progress Notes (Signed)
   [facility] Follow-up Outpatient Visit  Sativa Gelles Bisig 10-09-1950  Date: 05/13/14  Subjective:  Pt is sleeping only 4 hours; appetite is fair. Depression is very bad this month; she has a lot of situational stressors. She is living with ex-husband. She found something at [facility], off of [address]. It's a 3 bedroom. She denies si/hi/avh. She is also having stressors at work. She is consistently tired, also with weather changing. Constricted affect. Tolerating medications. Will start therapy, lost number. Will giver it to her again.Will increase the aripiprazole 15 mg po. Depression 8/10, Anxiety, 5/10. Discussed coping skills, she walks and rides her bike. Rtc in 4 weeks.   There were no vitals filed for this visit.  Mental Status Examination  Appearance: casual  Alert: Yes Attention: fair  Cooperative: Yes Eye Contact: Fair Speech: slow  Psychomotor Activity: Psychomotor Retardation Memory/Concentration: fair  Oriented: time/date and day of week Mood: Anxious and Dysphoric Affect: Constricted and Depressed Thought Processes and Associations: Linear Fund of Knowledge: Fair Thought Content: preoccupations Insight: Fair Judgement: Fair  Diagnosis:  Bipolar 1, mre depressed  Treatment Plan:  Topamax 100 mg at bedtime for mood and migraines  Ariprazole 15 mg po for daily for mood  Hydroxyzine 50 mg tid prn anxiety  Rtc in 4 weeks   Kendrick Fries, NP

## 2014-05-25 ENCOUNTER — Encounter (HOSPITAL_COMMUNITY): Payer: Self-pay | Admitting: Licensed Clinical Social Worker

## 2014-05-25 ENCOUNTER — Ambulatory Visit (INDEPENDENT_AMBULATORY_CARE_PROVIDER_SITE_OTHER): Payer: BC Managed Care – PPO | Admitting: Licensed Clinical Social Worker

## 2014-05-25 DIAGNOSIS — F3131 Bipolar disorder, current episode depressed, mild: Secondary | ICD-10-CM
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
## 2014-06-15 ENCOUNTER — Ambulatory Visit (HOSPITAL_COMMUNITY): Payer: Self-pay | Admitting: Psychiatry

## 2014-07-02 ENCOUNTER — Encounter (HOSPITAL_COMMUNITY): Payer: Self-pay | Admitting: Licensed Clinical Social Worker

## 2014-07-02 ENCOUNTER — Telehealth (HOSPITAL_COMMUNITY): Payer: Self-pay | Admitting: Licensed Clinical Social Worker

## 2014-08-19 ENCOUNTER — Other Ambulatory Visit (HOSPITAL_COMMUNITY): Payer: Self-pay | Admitting: *Deleted

## 2014-08-19 DIAGNOSIS — F319 Bipolar disorder, unspecified: Secondary | ICD-10-CM

## 2014-08-19 MED ORDER — TOPIRAMATE 100 MG PO TABS: 100.0000 mg | ORAL_TABLET | Freq: Every day | ORAL | Status: DC

## 2014-08-19 NOTE — Telephone Encounter (Signed)
Chart reviewed. Refill given for 1 time 30 day supply. Dr. Ladona Ridgelaylor approved. Note to pharmacy that patient needs appointment before future refills can be given.

## 2014-08-25 ENCOUNTER — Other Ambulatory Visit (HOSPITAL_COMMUNITY): Payer: Self-pay | Admitting: *Deleted

## 2014-08-25 DIAGNOSIS — F319 Bipolar disorder, unspecified: Secondary | ICD-10-CM

## 2014-08-25 MED ORDER — TOPIRAMATE 100 MG PO TABS: 100.0000 mg | ORAL_TABLET | Freq: Every day | ORAL | Status: DC

## 2014-08-25 NOTE — Telephone Encounter (Signed)
refill request for Topamax.Per [facility], last filled 07/14/14.Last appt 05/13/14 w/NP.No appt scheduled.Per Dr. Lubertha Basqueaylor's order:#30 with note that no further refills w/o appt. RX called to [facility]

## 2014-12-19 NOTE — Telephone Encounter (Signed)
error 

## 2018-04-03 ENCOUNTER — Other Ambulatory Visit: Payer: Self-pay | Admitting: Physician Assistant

## 2018-04-03 ENCOUNTER — Ambulatory Visit
Admission: RE | Admit: 2018-04-03 | Discharge: 2018-04-03 | Disposition: A | Discharge status: Home / Self Care | Payer: Medicare Other | Source: Ambulatory Visit | Attending: Physician Assistant | Admitting: Physician Assistant

## 2018-04-03 DIAGNOSIS — M79604 Pain in right leg: Secondary | ICD-10-CM

## 2018-04-03 DIAGNOSIS — R238 Other skin changes: Secondary | ICD-10-CM

## 2018-04-03 DIAGNOSIS — R233 Spontaneous ecchymoses: Secondary | ICD-10-CM

## 2020-02-10 ENCOUNTER — Other Ambulatory Visit: Payer: Self-pay

## 2020-02-10 ENCOUNTER — Ambulatory Visit: Payer: Medicare HMO | Admitting: Cardiology

## 2020-02-10 VITALS — BP 137/82 | HR 46 | Ht 63.0 in | Wt 159.0 lb

## 2020-02-10 DIAGNOSIS — R001 Bradycardia, unspecified: Secondary | ICD-10-CM

## 2020-02-10 DIAGNOSIS — R0609 Other forms of dyspnea: Secondary | ICD-10-CM

## 2020-02-10 DIAGNOSIS — R072 Precordial pain: Secondary | ICD-10-CM

## 2020-02-10 DIAGNOSIS — I10 Essential (primary) hypertension: Secondary | ICD-10-CM

## 2020-02-10 DIAGNOSIS — Z87891 Personal history of nicotine dependence: Secondary | ICD-10-CM

## 2020-02-10 NOTE — Patient Instructions (Signed)
Week 1: Toprol-XL 12.5 mg p.o. daily  Week 2: Toprol-XL 12.5 mg p.o. every other day  Week 3: Start Toprol-XL  Week 4:Schedule your stress test and monitor.

## 2020-02-10 NOTE — Progress Notes (Signed)
Date:  02/10/2020   ID:  Heather Gray, DOB [birth year], MRN [id number]  PCP:  Heywood Bene, PA-C  Cardiologist:  Rex Kras, DO, [facility] (established care 02/10/2020)  REASON FOR CONSULT: Sinus bradycardia and preoperative risk stratification  REQUESTING PHYSICIAN:  Heywood Bene, PA-C [address] [place],  [place] [address]  Chief Complaint  Patient presents with  . Bradycardia  . New Patient (Initial Visit)  . Abnormal ECG    HPI  Heather Gray is a 69 y.o. female who is being seen today for the evaluation of bradycardia and preoperative risk stratification at the request of Williams, Breejante J, *. Patient's past medical history and cardiac risk factors include: Benign essential hypertension, former smoker, thyroid disease, anxiety, depression, postmenopausal female, advanced age.  Bradycardia: Patient states that she has noted her pulse to be lower than 60bpm for the last 2 years.  However, for the last 1 year she has been feeling more tired and fatigued.  She has not had any associated symptoms of lightheadedness or dizziness or syncope.  She was originally on Toprol-XL 1 mg p.o. daily at which time her heart rate was in 30-40 bpm this has been reduced to 25 mg p.o. daily.  Chest pain: Patient also mentions she has atypical chest pain over the left anterior chest wall above the breast tissue.  She is able to localize discomfort, described as an ache-like sensation, intensity is 5 out of 10, last for about 1 minute, nonradiating, no improving or worsening factors, usually self-limited.  The pain is not brought on by effort related activities nor does it improve with rest.  Preoperative risk stratification: Patient is planning to have a right hip replacement date is to be determined and she will be under the care of Dr. Delfino Lovett at [facility]  Patient is nondiabetic and denies history of coronary artery disease, myocardial infarction, congestive  heart failure, deep venous thrombosis, pulmonary embolism, stroke, transient ischemic attack.  FUNCTIONAL STATUS: She walks 0.25 mile everyday and keeps a cane close by it needed. She also enjoys doing yardwork.  ALLERGIES: Allergies  Allergen Reactions  . Sulfa Antibiotics   . Tetracyclines & Related Other (See Comments)    Racing heart, fast breathing    MEDICATION LIST PRIOR TO VISIT: Current Meds  Medication Sig  . ALPRAZolam (XANAX) 0.5 MG tablet Take 0.5 mg by mouth at bedtime as needed for anxiety.  Marland Kitchen amLODipine (NORVASC) 5 MG tablet Take 5 mg by mouth daily.  . folic acid (FOLVITE) 1 MG tablet Take 1 mg by mouth daily.  . hydrochlorothiazide (HYDRODIURIL) 25 MG tablet Take 25 mg by mouth daily.  Marland Kitchen lamoTRIgine (LAMICTAL) 100 MG tablet Take 100 mg by mouth daily.  Marland Kitchen losartan (COZAAR) 100 MG tablet Take 100 mg by mouth every morning.  . Metoprolol Succinate 25 MG CS24 Take 1 tablet by mouth daily.  . QUEtiapine (SEROQUEL) 25 MG tablet Take 25-75 mg by mouth at bedtime.  . rizatriptan (MAXALT) 10 MG tablet Take 10 mg by mouth as needed for migraine. May repeat in 2 hours if needed  . sertraline (ZOLOFT) 50 MG tablet Take 50 mg by mouth daily.  . vitamin B-12 (CYANOCOBALAMIN) 500 MCG tablet Take 500 mcg by mouth daily.  . Vitamin D, Ergocalciferol, (DRISDOL) 50000 UNITS CAPS capsule Take 50,000 Units by mouth every 7 (seven) days.     PAST MEDICAL HISTORY: Past Medical History:  Diagnosis Date  . Anxiety   .  Bradycardia   . Depression   . Hypertension   . Migraine   . Thyroid disease    hyperthyroid    PAST SURGICAL HISTORY: Past Surgical History:  Procedure Laterality Date  . ABLATION SAPHENOUS VEIN W/ RFA Right   . JOINT REPLACEMENT Left 2008    FAMILY HISTORY: The patient family history includes Alcohol abuse in her father, mother, and sister; Schizophrenia in her sister.  SOCIAL HISTORY:  The patient  reports that she quit smoking about 31 years ago. Her  smoking use included cigarettes. She has a 60.00 pack-year smoking history. She has never used smokeless tobacco. She reports current alcohol use. She reports current drug use. Drug: Marijuana.  REVIEW OF SYSTEMS: Review of Systems  Constitutional: Positive for malaise/fatigue. Negative for chills and fever.  HENT: Negative for hoarse voice and nosebleeds.   Eyes: Negative for discharge, double vision and pain.  Cardiovascular: Positive for chest pain and dyspnea on exertion. Negative for claudication, leg swelling, near-syncope, orthopnea, palpitations, paroxysmal nocturnal dyspnea and syncope.  Respiratory: Negative for hemoptysis and shortness of breath.   Musculoskeletal: Negative for muscle cramps and myalgias.  Gastrointestinal: Negative for abdominal pain, constipation, diarrhea, hematemesis, hematochezia, melena, nausea and vomiting.  Neurological: Negative for dizziness and light-headedness.    PHYSICAL EXAM: Vitals with BMI 02/10/2020 10/16/2013 10/16/2013  Height _0  - -  Weight 159 lbs - -  BMI 71.06 - -  Systolic 269 485 462  Diastolic 82 76 93  Pulse 46 76 72  Some encounter information is confidential and restricted. Go to Review Flowsheets activity to see all data.   CONSTITUTIONAL: Well-developed and well-nourished. No acute distress.  SKIN: Skin is warm and dry. No rash noted. No cyanosis. No pallor. No jaundice HEAD: Normocephalic and atraumatic.  EYES: No scleral icterus MOUTH/THROAT: Moist oral membranes.  NECK: No JVD present. No thyromegaly noted. No carotid bruits  LYMPHATIC: No visible cervical adenopathy.  CHEST Normal respiratory effort. No intercostal retractions  LUNGS: Clear to auscultation bilaterally.  No stridor. No wheezes. No rales.  CARDIOVASCULAR: Bradycardia, positive S1-S2, no murmurs rubs or gallops appreciated. ABDOMINAL: soft, nontender, nondistended, positive bowel sounds all 4 quadrants. No apparent ascites.  EXTREMITIES: No peripheral  edema  HEMATOLOGIC: No significant bruising NEUROLOGIC: Oriented to person, place, and time. Nonfocal. Normal muscle tone.  PSYCHIATRIC: Normal mood and affect. Normal behavior. Cooperative  CARDIAC DATABASE: EKG: 02/10/2020: Sinus  Bradycardia, 44 bpm, nonspecific QRS widening, left axis deviation, LAFB.  Echocardiogram: None  Stress Testing: None  Heart Catheterization: None   LABORATORY DATA: External Labs: Collected: Jan 14, 2020 at [facility] Creatinine 1.0 mg/dL. eGFR: 58 mL/min per 1.73 m at bedtime Hemoglobin A1c: 5.0  10/27/2019: [facility] TSH: 2.57   IMPRESSION:    ICD-10-CM   1. Bradycardia  R00.1 EKG 12-Lead    HOLTER MONITOR - 24 HOUR    PCV ECHOCARDIOGRAM COMPLETE    PCV MYOCARDIAL PERFUSION WO LEXISCAN  2. Dyspnea on exertion  R06.00   3. Precordial chest pain  R07.2   4. Benign hypertension  I10   5. Former smoker  Z87.891      RECOMMENDATIONS: JEREMIAH TARPLEY is a 69 y.o. female whose past medical history and cardiac risk factors include: Benign essential hypertension, former smoker, thyroid disease, anxiety, depression, postmenopausal female, advanced age.  Bradycardia:  Patient appears to be asymptomatic her symptoms of fatigue and tiredness are quite nonspecific.   Patient's underlying thyroid disease she  is asked to have a repeat TSH done's office.   Recommend discontinuing Toprol-XL; however, since she is been on it for long time would recommend weaning her off.   Once that she has been weaned off of beta-blocker therapy would recommend 24-hour Holter monitor to evaluate for underlying arrhythmia and average heart rate.  Recommend a walking nuclear stress test to evaluate for reversible ischemia and chronotropic competence.   Echocardiogram will be ordered to evaluate for structural heart disease and left ventricular systolic function.  Preoperative risk stratification: Forthcoming.  Dyspnea on  exertion: Patient states that when she overexerts herself she does have effort related dyspnea.  Plan echo and nuclear stress test as noted above.  Precordial chest pain: Patient symptoms of chest pain appears to be atypical in nature.  However given her multiple cardiovascular risk factors and bradycardia recommend ischemic evaluation as outlined above.  Former smoker: Educated on the importance of continued smoking cessation.  FINAL MEDICATION LIST END OF ENCOUNTER: No orders of the defined types were placed in this encounter.   Medications Discontinued During This Encounter  Medication Reason  . methimazole (TAPAZOLE) 10 MG tablet Completed Course  . metoprolol succinate (TOPROL-XL) 100 MG 24 hr tablet Completed Course  . rizatriptan (MAXALT) 10 MG tablet Completed Course  . hydrOXYzine (VISTARIL) 50 MG capsule Completed Course     Current Outpatient Medications:  .  ALPRAZolam (XANAX) 0.5 MG tablet, Take 0.5 mg by mouth at bedtime as needed for anxiety., Disp: , Rfl:  .  amLODipine (NORVASC) 5 MG tablet, Take 5 mg by mouth daily., Disp: , Rfl:  .  folic acid (FOLVITE) 1 MG tablet, Take 1 mg by mouth daily., Disp: , Rfl:  .  hydrochlorothiazide (HYDRODIURIL) 25 MG tablet, Take 25 mg by mouth daily., Disp: , Rfl:  .  lamoTRIgine (LAMICTAL) 100 MG tablet, Take 100 mg by mouth daily., Disp: , Rfl:  .  losartan (COZAAR) 100 MG tablet, Take 100 mg by mouth every morning., Disp: , Rfl:  .  Metoprolol Succinate 25 MG CS24, Take 1 tablet by mouth daily., Disp: , Rfl:  .  QUEtiapine (SEROQUEL) 25 MG tablet, Take 25-75 mg by mouth at bedtime., Disp: , Rfl:  .  rizatriptan (MAXALT) 10 MG tablet, Take 10 mg by mouth as needed for migraine. May repeat in 2 hours if needed, Disp: , Rfl:  .  sertraline (ZOLOFT) 50 MG tablet, Take 50 mg by mouth daily., Disp: , Rfl:  .  vitamin B-12 (CYANOCOBALAMIN) 500 MCG tablet, Take 500 mcg by mouth daily., Disp: , Rfl:  .  Vitamin D, Ergocalciferol, (DRISDOL)  50000 UNITS CAPS capsule, Take 50,000 Units by mouth every 7 (seven) days., Disp: , Rfl:  .  ARIPiprazole (ABILIFY) 10 MG tablet, Take 1.5 tablets (15 mg total) by mouth daily., Disp: 30 tablet, Rfl: 2 .  topiramate (TOPAMAX) 100 MG tablet, Take 1 tablet (100 mg total) by mouth at bedtime. (Patient taking differently: Take 200 mg by mouth at bedtime. ), Disp: 30 tablet, Rfl: 0  Orders Placed This Encounter  Procedures  . HOLTER MONITOR - 24 HOUR  . PCV MYOCARDIAL PERFUSION WO LEXISCAN  . EKG 12-Lead  . PCV ECHOCARDIOGRAM COMPLETE    Patient Instructions  Week 1: Toprol-XL 12.5 mg p.o. daily  Week 2: Toprol-XL 12.5 mg p.o. every other day  Week 3: Start Toprol-XL  Week 4:Schedule your stress test and monitor.   --Continue cardiac medications as reconciled in final medication list. --Return in  about 6 weeks (around 03/23/2020) for review test results., re-evaluation of symptoms.. Or sooner if needed. --Continue follow-up with your primary care physician regarding the management of your other chronic comorbid conditions.  Patient's questions and concerns were addressed to her satisfaction. She voices understanding of the instructions provided during this encounter.   This note was created using a voice recognition software as a result there may be grammatical errors inadvertently enclosed that do not reflect the nature of this encounter. Every attempt is made to correct such errors.  Rex Kras, [place], [facility]  Pager: [phone] Office: [phone]

## 2020-02-12 ENCOUNTER — Other Ambulatory Visit: Payer: Medicare HMO

## 2020-02-18 ENCOUNTER — Other Ambulatory Visit: Payer: Self-pay

## 2020-02-18 ENCOUNTER — Ambulatory Visit: Payer: Medicare HMO

## 2020-02-18 DIAGNOSIS — R001 Bradycardia, unspecified: Secondary | ICD-10-CM

## 2020-03-07 ENCOUNTER — Other Ambulatory Visit: Payer: Self-pay

## 2020-03-07 ENCOUNTER — Ambulatory Visit: Payer: Medicare HMO

## 2020-03-07 DIAGNOSIS — R001 Bradycardia, unspecified: Secondary | ICD-10-CM

## 2020-03-15 ENCOUNTER — Telehealth: Payer: Self-pay

## 2020-03-15 NOTE — Telephone Encounter (Signed)
-----   Message from [place], [place] sent at 03/12/2020 11:37 PM EDT ----- The nuclear stress test that was recently performed was essentially reported as being normal perfusion and low risk study.    The other details of the report will be discussed at the next office visit.    If you continue to have symptoms that have increased in intensity, frequency, duration or new symptoms suggestive of typical chest pain as discussed during last office visit please still seek medical attention at the closest ER via EMS.

## 2020-03-15 NOTE — Telephone Encounter (Signed)
Relayed information to patient. Patient voiced understanding.  

## 2020-03-23 ENCOUNTER — Ambulatory Visit: Payer: Medicare HMO | Admitting: Cardiology

## 2020-03-23 ENCOUNTER — Ambulatory Visit: Payer: Medicare HMO

## 2020-03-23 ENCOUNTER — Other Ambulatory Visit: Payer: Self-pay

## 2020-03-23 ENCOUNTER — Encounter: Payer: Self-pay | Admitting: Cardiology

## 2020-03-23 VITALS — BP 110/69 | HR 58 | Ht 64.5 in | Wt 156.4 lb

## 2020-03-23 DIAGNOSIS — Z01810 Encounter for preprocedural cardiovascular examination: Secondary | ICD-10-CM

## 2020-03-23 DIAGNOSIS — R001 Bradycardia, unspecified: Secondary | ICD-10-CM

## 2020-03-23 DIAGNOSIS — I10 Essential (primary) hypertension: Secondary | ICD-10-CM

## 2020-03-23 DIAGNOSIS — Z712 Person consulting for explanation of examination or test findings: Secondary | ICD-10-CM

## 2020-03-23 DIAGNOSIS — Z87891 Personal history of nicotine dependence: Secondary | ICD-10-CM

## 2020-03-23 NOTE — Progress Notes (Signed)
Date:  03/23/2020   ID:  Heather Gray, DOB 1950/11/09, MRN [id number]  PCP:  Heywood Bene, PA-C  Cardiologist:  Rex Kras, DO, [facility] (established care 02/10/2020)  Date: 03/23/20 Last Office Visit: 02/10/2020  Chief Complaint  Patient presents with  . Bradycardia  . Follow-up    test results  . Pre-op Exam    HPI  Heather Gray is a 69 y.o. female who presents to the office with a chief complaint of "review test results and preoperative risk-stratification." Patient's past medical history and cardiac risk factors include: Benign essential hypertension, former smoker, thyroid disease, anxiety, depression, postmenopausal female, advanced age.  Patient was referred to the office back in June 2021 for evaluation of bradycardia and preoperative risk stratification.  Bradycardia: Patient states that since last office visit she feels better.  Prior to that she was having symptoms of chronic tiredness fatigue as she was on Toprol-XL 100 mg p.o. daily.  Toprol-XL was titrated down but still continued to feel the same.  Patient was asked to wean off of Toprol-XL at last office visit and since then has been doing well.  She feels that she has more energy as well.  She was supposed to have a Holter monitor to evaluate for underlying arrhythmic burden but this unfortunately has not occurred as of yet.  She had atypical chest pain at the last office visit and given her cardiovascular risk factor was recommended to undergo an echocardiogram and nuclear stress test.  The echocardiogram noted preserved left ventricular systolic function with mild valvular heart disease.  These findings were discussed with her in great detail.  She also had a nuclear stress test which noted normal myocardial perfusion and overall low risk study.  Since last visit she denies any chest pain or shortness of breath at rest or with effort related activities.  Preoperative risk stratification: Patient is planning to have a right  hip replacement date is to be determined and she will be under the care of Dr. Delfino Lovett at [facility]. Patient is nondiabetic and denies history of coronary artery disease, myocardial infarction, congestive heart failure, deep venous thrombosis, pulmonary embolism, stroke, transient ischemic attack.  FUNCTIONAL STATUS: She walks 0.25 mile everyday and keeps a cane close by it needed. She also enjoys doing yardwork.  ALLERGIES: Allergies  Allergen Reactions  . Sulfa Antibiotics   . Tetracyclines & Related Other (See Comments)    Racing heart, fast breathing    MEDICATION LIST PRIOR TO VISIT: Current Meds  Medication Sig  . ALPRAZolam (XANAX) 0.5 MG tablet Take 0.5 mg by mouth at bedtime as needed for anxiety.  Marland Kitchen amLODipine (NORVASC) 5 MG tablet Take 5 mg by mouth daily.  . hydrochlorothiazide (HYDRODIURIL) 25 MG tablet Take 25 mg by mouth daily.  Marland Kitchen lamoTRIgine (LAMICTAL) 100 MG tablet Take 100 mg by mouth daily.  Marland Kitchen losartan (COZAAR) 100 MG tablet Take 100 mg by mouth every morning.  Marland Kitchen QUEtiapine (SEROQUEL) 25 MG tablet Take 25-75 mg by mouth at bedtime.  . rizatriptan (MAXALT) 10 MG tablet Take 10 mg by mouth as needed for migraine. May repeat in 2 hours if needed  . sertraline (ZOLOFT) 50 MG tablet Take 50 mg by mouth daily.  Marland Kitchen topiramate (TOPAMAX) 200 MG tablet Take 1 tablet by mouth daily.  . vitamin B-12 (CYANOCOBALAMIN) 500 MCG tablet Take 500 mcg by mouth daily.  Marland Kitchen VITAMIN D PO Take by mouth.     PAST MEDICAL  HISTORY: Past Medical History:  Diagnosis Date  . Anxiety   . Bradycardia   . Depression   . Hypertension   . Migraine   . Thyroid disease    hyperthyroid    PAST SURGICAL HISTORY: Past Surgical History:  Procedure Laterality Date  . ABLATION SAPHENOUS VEIN W/ RFA Right   . JOINT REPLACEMENT Left 2008    FAMILY HISTORY: The patient family history includes Alcohol abuse in her father, mother, and sister; Schizophrenia in her  sister.  SOCIAL HISTORY:  The patient  reports that she quit smoking about 31 years ago. Her smoking use included cigarettes. She has a 60.00 pack-year smoking history. She has never used smokeless tobacco. She reports current alcohol use. She reports current drug use. Drug: Marijuana.  REVIEW OF SYSTEMS: Review of Systems  Constitutional: Negative for chills, fever and malaise/fatigue.  HENT: Negative for hoarse voice and nosebleeds.   Eyes: Negative for discharge, double vision and pain.  Cardiovascular: Negative for chest pain, claudication, dyspnea on exertion, leg swelling, near-syncope, orthopnea, palpitations, paroxysmal nocturnal dyspnea and syncope.  Respiratory: Negative for hemoptysis and shortness of breath.   Musculoskeletal: Negative for muscle cramps and myalgias.  Gastrointestinal: Negative for abdominal pain, constipation, diarrhea, hematemesis, hematochezia, melena, nausea and vomiting.  Neurological: Negative for dizziness and light-headedness.    PHYSICAL EXAM: Vitals with BMI 03/23/2020 02/10/2020 10/16/2013  Height 5' 4.5" $Remov'5\' 3"'ErHmJA$  -  Weight 156 lbs 6 oz 159 lbs -  BMI 54.09 81.19 -  Systolic 147 829 562  Diastolic 69 82 76  Pulse 58 46 76  Some encounter information is confidential and restricted. Go to Review Flowsheets activity to see all data.   CONSTITUTIONAL: Well-developed and well-nourished. No acute distress.  SKIN: Skin is warm and dry. No rash noted. No cyanosis. No pallor. No jaundice HEAD: Normocephalic and atraumatic.  EYES: No scleral icterus MOUTH/THROAT: Moist oral membranes.  NECK: No JVD present. No thyromegaly noted. No carotid bruits  LYMPHATIC: No visible cervical adenopathy.  CHEST Normal respiratory effort. No intercostal retractions  LUNGS: Clear to auscultation bilaterally.  No stridor. No wheezes. No rales.  CARDIOVASCULAR: Bradycardia, positive S1-S2, no murmurs rubs or gallops appreciated. ABDOMINAL: soft, nontender, nondistended,  positive bowel sounds all 4 quadrants. No apparent ascites.  EXTREMITIES: No peripheral edema  HEMATOLOGIC: No significant bruising NEUROLOGIC: Oriented to person, place, and time. Nonfocal. Normal muscle tone.  PSYCHIATRIC: Normal mood and affect. Normal behavior. Cooperative  CARDIAC DATABASE: EKG: 03/23/2020: Sinus bradycardia, 52 bpm, left axis deviation, poor R wave progression, without underlying ischemia or injury pattern.  Echocardiogram: 02/18/2020:  Normal LV systolic function with visual EF 55-60%. Left ventricle cavity is normal in size. Normal global wall motion. Doppler evidence of grade II (pseudonormal) diastolic dysfunction, normal LAP. Calculated EF 55%.  Left atrial cavity is severely dilated.  Mild (Grade I) aortic regurgitation.  Mild (Grade I) mitral regurgitation.  Mild tricuspid regurgitation. No evidence of pulmonary hypertension.  No prior study for comparison.   Stress Testing: Lexiscan Tetrofosmin stress test 03/07/2020:  Lexiscan nuclear stress test performed using 1-day protocol.  Normal myocardial perfusion. Stress LVEF 63%.  Low risk study,  Heart Catheterization: None   LABORATORY DATA: External Labs: Collected: Jan 14, 2020 at [facility] Creatinine 1.0 mg/dL. eGFR: 58 mL/min per 1.73 m at bedtime Hemoglobin A1c: 5.0  10/27/2019: [facility] TSH: 2.57   IMPRESSION:    ICD-10-CM   1. Preop cardiovascular exam  Z01.810 EKG 12-Lead  2. Bradycardia  R00.1 LONG TERM MONITOR (3-14 DAYS)  3. Benign hypertension  I10   4. Former smoker  Z87.891   5. Encounter to discuss test results  Z71.2      RECOMMENDATIONS: Heather Gray is a 69 y.o. female whose past medical history and cardiac risk factors include: Benign essential hypertension, former smoker, thyroid disease, anxiety, depression, postmenopausal female, advanced age.  Bradycardia: Improving.  Patient is now asymptomatic in regards to no longer  feeling tired and fatigue.  She has more energy and is compared to the last office visit.    EKG was repeated which shows a improved ventricular rate.    Repeat TSH within normal limits.   Echo and stress test results reviewed and noted above.   LVEF is preserved and Stress test was low risk.   Would still recommend 3 day holter to evaluate for arrthymic burden.   Preoperative risk stratification: Forthcoming, s/p monitor.   Precordial chest pain: Resolved. Echo and stress test results reviewed and noted above. LVEF is preserved and Stress test was low risk.   Former smoker: Educated on the importance of continued smoking cessation.  FINAL MEDICATION LIST END OF ENCOUNTER: No orders of the defined types were placed in this encounter.   Current Outpatient Medications:  .  ALPRAZolam (XANAX) 0.5 MG tablet, Take 0.5 mg by mouth at bedtime as needed for anxiety., Disp: , Rfl:  .  amLODipine (NORVASC) 5 MG tablet, Take 5 mg by mouth daily., Disp: , Rfl:  .  hydrochlorothiazide (HYDRODIURIL) 25 MG tablet, Take 25 mg by mouth daily., Disp: , Rfl:  .  lamoTRIgine (LAMICTAL) 100 MG tablet, Take 100 mg by mouth daily., Disp: , Rfl:  .  losartan (COZAAR) 100 MG tablet, Take 100 mg by mouth every morning., Disp: , Rfl:  .  QUEtiapine (SEROQUEL) 25 MG tablet, Take 25-75 mg by mouth at bedtime., Disp: , Rfl:  .  rizatriptan (MAXALT) 10 MG tablet, Take 10 mg by mouth as needed for migraine. May repeat in 2 hours if needed, Disp: , Rfl:  .  sertraline (ZOLOFT) 50 MG tablet, Take 50 mg by mouth daily., Disp: , Rfl:  .  topiramate (TOPAMAX) 200 MG tablet, Take 1 tablet by mouth daily., Disp: , Rfl:  .  vitamin B-12 (CYANOCOBALAMIN) 500 MCG tablet, Take 500 mcg by mouth daily., Disp: , Rfl:  .  VITAMIN D PO, Take by mouth., Disp: , Rfl:   Orders Placed This Encounter  Procedures  . LONG TERM MONITOR (3-14 DAYS)  . EKG 12-Lead    There are no Patient Instructions on file for this visit.   --Continue cardiac medications as reconciled in final medication list. --Return in about 3 months (around 06/23/2020) for post-op follow up. , review test results.. Or sooner if needed. --Continue follow-up with your primary care physician regarding the management of your other chronic comorbid conditions.  Patient's questions and concerns were addressed to her satisfaction. She voices understanding of the instructions provided during this encounter.   This note was created using a voice recognition software as a result there may be grammatical errors inadvertently enclosed that do not reflect the nature of this encounter. Every attempt is made to correct such errors.  Rex Kras, [place], [facility]  Pager: [phone] Office: [phone]

## 2020-05-03 NOTE — Progress Notes (Signed)
Patient did not answer left a voicemail  Sch/rma

## 2020-05-03 NOTE — Progress Notes (Signed)
Patient called back, I have let her know you will be going over results of monitor at her next OV, she is ok with that. She also stated that because of COVID, they are post-poning her hip surgery because it is considered "an elected surgery", so no, she will not be needing preoperative letter anytime soon. She can get it at next OV .

## 2020-06-13 ENCOUNTER — Ambulatory Visit: Payer: Self-pay | Admitting: Student

## 2020-06-15 NOTE — Progress Notes (Addendum)
COVID Vaccine Completed: x2 Date COVID Vaccine completed: 10-26-19 and 11-24-19  COVID vaccine manufacturer: Pfizer    Moderna   [employer]   PCP - Rueben Bash, PA-C Cardiologist - Tessa Lerner, DO.  Last OV 03-23-20  Chest x-ray -  EKG - 03-27-20 in Epic Stress Test - 03-07-20 in Epic ECHO - 01-29-20 in Epic Cardiac Cath -  Pacemaker/ICD device last checked: Long Term Monitor - 03-23-20 in Epic   Sleep Study - Yes, pos for sleep apnea CPAP - No  Fasting Blood Sugar -  Checks Blood Sugar _____ times a day  Blood Thinner Instructions: Aspirin Instructions: Last Dose:  Anesthesia review:  Bradycardia.  Improved after stopping Metoprolol  Patient denies shortness of breath, fever, cough and chest pain at PAT appointment   Patient verbalized understanding of instructions that were given to them at the PAT appointment. Patient was also instructed that they will need to review over the PAT instructions again at home before surgery.

## 2020-06-15 NOTE — Patient Instructions (Addendum)
DUE TO COVID-19 ONLY ONE VISITOR IS ALLOWED TO COME WITH YOU AND STAY IN THE WAITING ROOM ONLY DURING PRE OP AND PROCEDURE.   IF YOU WILL BE ADMITTED INTO THE HOSPITAL YOU ARE ALLOWED ONE SUPPORT PERSON DURING VISITATION HOURS ONLY (10AM -8PM)   . The support person may change daily. . The support person must pass our screening, gel in and out, and wear a mask at all times, including in the patient's room. . Patients must also wear a mask when staff or their support person are in the room.   COVID SWAB TESTING MUST BE COMPLETED ON:   Saturday, 06-25-20 @ 10:05 AM   [address] [place], [place] [address]  (Must self quarantine after testing. Follow instructions on handout.)    Your procedure is scheduled on:  Wednesday, 06-29-20   Report to [facility] Main  Entrance    Report to admitting at 6:00 AM   Call this number if you have problems the morning of surgery [phone]   Do not eat food :After Midnight.   May have liquids until 5:30 AM  day of surgery  CLEAR LIQUID DIET  Foods Allowed                                                                     Foods Excluded  Water, Black Coffee and tea, regular and decaf            liquids that you cannot  Plain Jell-O in any flavor  (No red)                                   see through such as: Fruit ices (not with fruit pulp)                                      milk, soups, orange juice              Iced Popsicles (No red)                                      All solid food                                   Apple juices Sports drinks like Gatorade (No red) Lightly seasoned clear broth or consume(fat free)  Sugar, honey syrup    Complete one Ensure drink the morning of surgery at 5:30 AM  the day of surgery.   Oral Hygiene is also important to reduce your risk of infection.                                    Remember - BRUSH YOUR TEETH THE MORNING OF SURGERY WITH YOUR REGULAR TOOTHPASTE   Do NOT smoke after  Midnight   Take these medicines the morning of surgery with A SIP  OF WATER:   Amlodipine, Lamictal, Sertraline, Topamax                                You may not have any metal on your body including hair pins, jewelry, and body piercings             Do not wear make-up, lotions, powders, perfumes/cologne, or deodorant             Do not wear nail polish.  Do not shave  48 hours prior to surgery.    Do not bring valuables to the hospital. [facility] IS NOT  RESPONSIBLE   FOR VALUABLES.   Contacts, dentures or bridgework may not be worn into surgery.   Bring small overnight bag day of surgery.                Please read over the following fact sheets you were given: IF YOU HAVE QUESTIONS ABOUT YOUR PRE OP INSTRUCTIONS PLEASE CALL [phone]   [facility] - Preparing for Surgery Before surgery, you can play an important role.  Because skin is not sterile, your skin needs to be as free of germs as possible.  You can reduce the number of germs on your skin by washing with CHG (chlorahexidine gluconate) soap before surgery.  CHG is an antiseptic cleaner which kills germs and bonds with the skin to continue killing germs even after washing. Please DO NOT use if you have an allergy to CHG or antibacterial soaps.  If your skin becomes reddened/irritated stop using the CHG and inform your nurse when you arrive at Short Stay. Do not shave (including legs and underarms) for at least 48 hours prior to the first CHG shower.  You may shave your face/neck.  Please follow these instructions carefully:  1.  Shower with CHG Soap the night before surgery and the  morning of surgery.  2.  If you choose to wash your hair, wash your hair first as usual with your normal  shampoo.  3.  After you shampoo, rinse your hair and body thoroughly to remove the shampoo.                             4.  Use CHG as you would any other liquid soap.  You can apply chg directly to the skin and wash.  Gently with a  scrungie or clean washcloth.  5.  Apply the CHG Soap to your body ONLY FROM THE NECK DOWN.   Do   not use on face/ open                           Wound or open sores. Avoid contact with eyes, ears mouth and   genitals (private parts).                       Wash face,  Genitals (private parts) with your normal soap.             6.  Wash thoroughly, paying special attention to the area where your    surgery  will be performed.  7.  Thoroughly rinse your body with warm water from the neck down.  8.  DO NOT shower/wash with your normal soap after using and rinsing off the CHG Soap.  9.  Pat yourself dry with a clean towel.            10.  Wear clean pajamas.            11.  Place clean sheets on your bed the night of your first shower and do not  sleep with pets. Day of Surgery : Do not apply any lotions/deodorants the morning of surgery.  Please wear clean clothes to the hospital/surgery center.  FAILURE TO FOLLOW THESE INSTRUCTIONS MAY RESULT IN THE CANCELLATION OF YOUR SURGERY  PATIENT SIGNATURE_________________________________  NURSE SIGNATURE__________________________________  ________________________________________________________________________   Heather Gray  An incentive spirometer is a tool that can help keep your lungs clear and active. This tool measures how well you are filling your lungs with each breath. Taking long deep breaths may help reverse or decrease the chance of developing breathing (pulmonary) problems (especially infection) following:  A long period of time when you are unable to move or be active. BEFORE THE PROCEDURE   If the spirometer includes an indicator to show your best effort, your nurse or respiratory therapist will set it to a desired goal.  If possible, sit up straight or lean slightly forward. Try not to slouch.  Hold the incentive spirometer in an upright position. INSTRUCTIONS FOR USE  1. Sit on the edge of your bed if  possible, or sit up as far as you can in bed or on a chair. 2. Hold the incentive spirometer in an upright position. 3. Breathe out normally. 4. Place the mouthpiece in your mouth and seal your lips tightly around it. 5. Breathe in slowly and as deeply as possible, raising the piston or the ball toward the top of the column. 6. Hold your breath for 3-5 seconds or for as long as possible. Allow the piston or ball to fall to the bottom of the column. 7. Remove the mouthpiece from your mouth and breathe out normally. 8. Rest for a few seconds and repeat Steps 1 through 7 at least 10 times every 1-2 hours when you are awake. Take your time and take a few normal breaths between deep breaths. 9. The spirometer may include an indicator to show your best effort. Use the indicator as a goal to work toward during each repetition. 10. After each set of 10 deep breaths, practice coughing to be sure your lungs are clear. If you have an incision (the cut made at the time of surgery), support your incision when coughing by placing a pillow or rolled up towels firmly against it. Once you are able to get out of bed, walk around indoors and cough well. You may stop using the incentive spirometer when instructed by your caregiver.  RISKS AND COMPLICATIONS  Take your time so you do not get dizzy or light-headed.  If you are in pain, you may need to take or ask for pain medication before doing incentive spirometry. It is harder to take a deep breath if you are having pain. AFTER USE  Rest and breathe slowly and easily.  It can be helpful to keep track of a log of your progress. Your caregiver can provide you with a simple table to help with this. If you are using the spirometer at home, follow these instructions: [place] IF:   You are having difficultly using the spirometer.  You have trouble using the spirometer as often as instructed.  Your pain medication is not giving enough relief while using  the spirometer.  You develop fever of 100.5 F (38.1 C) or higher. SEEK IMMEDIATE MEDICAL CARE IF:   You cough up bloody sputum that had not been present before.  You develop fever of 102 F (38.9 C) or greater.  You develop worsening pain at or near the incision site. MAKE SURE YOU:   Understand these instructions.  Will watch your condition.  Will get help right away if you are not doing well or get worse. Document Released: 12/24/2006 Document Revised: 11/05/2011 Document Reviewed: 02/24/2007 [employer] Patient Information 2014 [employer], [place].   ________________________________________________________________________  WHAT IS A BLOOD TRANSFUSION? Blood Transfusion Information  A transfusion is the replacement of blood or some of its parts. Blood is made up of multiple cells which provide different functions.  Red blood cells carry oxygen and are used for blood loss replacement.  White blood cells fight against infection.  Platelets control bleeding.  Plasma helps clot blood.  Other blood products are available for specialized needs, such as hemophilia or other clotting disorders. BEFORE THE TRANSFUSION  Who gives blood for transfusions?   Healthy volunteers who are fully evaluated to make sure their blood is safe. This is blood bank blood. Transfusion therapy is the safest it has ever been in the practice of medicine. Before blood is taken from a donor, a complete history is taken to make sure that person has no history of diseases nor engages in risky social behavior (examples are intravenous drug use or sexual activity with multiple partners). The donor's travel history is screened to minimize risk of transmitting infections, such as malaria. The donated blood is tested for signs of infectious diseases, such as HIV and hepatitis. The blood is then tested to be sure it is compatible with you in order to minimize the chance of a transfusion reaction. If you or a relative  donates blood, this is often done in anticipation of surgery and is not appropriate for emergency situations. It takes many days to process the donated blood. RISKS AND COMPLICATIONS Although transfusion therapy is very safe and saves many lives, the main dangers of transfusion include:   Getting an infectious disease.  Developing a transfusion reaction. This is an allergic reaction to something in the blood you were given. Every precaution is taken to prevent this. The decision to have a blood transfusion has been considered carefully by your caregiver before blood is given. Blood is not given unless the benefits outweigh the risks. AFTER THE TRANSFUSION  Right after receiving a blood transfusion, you will usually feel much better and more energetic. This is especially true if your red blood cells have gotten low (anemic). The transfusion raises the level of the red blood cells which carry oxygen, and this usually causes an energy increase.  The nurse administering the transfusion will monitor you carefully for complications. HOME CARE INSTRUCTIONS  No special instructions are needed after a transfusion. You may find your energy is better. Speak with your caregiver about any limitations on activity for underlying diseases you may have. SEEK MEDICAL CARE IF:   Your condition is not improving after your transfusion.  You develop redness or irritation at the intravenous (IV) site. SEEK IMMEDIATE MEDICAL CARE IF:  Any of the following symptoms occur over the next 12 hours:  Shaking chills.  You have a temperature by mouth above 102 F (38.9 C), not controlled by medicine.  Chest, back, or muscle pain.  People around you feel you are not acting correctly or are confused.  Shortness of  breath or difficulty breathing.  Dizziness and fainting.  You get a rash or develop hives.  You have a decrease in urine output.  Your urine turns a dark color or changes to pink, red, or brown. Any  of the following symptoms occur over the next 10 days:  You have a temperature by mouth above 102 F (38.9 C), not controlled by medicine.  Shortness of breath.  Weakness after normal activity.  The white part of the eye turns yellow (jaundice).  You have a decrease in the amount of urine or are urinating less often.  Your urine turns a dark color or changes to pink, red, or brown. Document Released: 08/10/2000 Document Revised: 11/05/2011 Document Reviewed: 03/29/2008 [facility] Patient Information 2014 [place], [place].  _______________________________________________________________________

## 2020-06-17 ENCOUNTER — Encounter (HOSPITAL_COMMUNITY): Payer: Self-pay

## 2020-06-17 ENCOUNTER — Encounter (HOSPITAL_COMMUNITY)
Admission: RE | Admit: 2020-06-17 | Discharge: 2020-06-17 | Disposition: A | Discharge status: Home / Self Care | Payer: Medicare HMO | Source: Ambulatory Visit | Attending: Orthopedic Surgery | Admitting: Orthopedic Surgery

## 2020-06-17 ENCOUNTER — Other Ambulatory Visit: Payer: Self-pay

## 2020-06-17 DIAGNOSIS — R001 Bradycardia, unspecified: Secondary | ICD-10-CM | POA: Insufficient documentation

## 2020-06-17 DIAGNOSIS — Z01812 Encounter for preprocedural laboratory examination: Secondary | ICD-10-CM | POA: Insufficient documentation

## 2020-06-17 DIAGNOSIS — E0590 Thyrotoxicosis, unspecified without thyrotoxic crisis or storm: Secondary | ICD-10-CM | POA: Diagnosis not present

## 2020-06-17 DIAGNOSIS — M1611 Unilateral primary osteoarthritis, right hip: Secondary | ICD-10-CM | POA: Insufficient documentation

## 2020-06-17 DIAGNOSIS — Z79899 Other long term (current) drug therapy: Secondary | ICD-10-CM | POA: Diagnosis not present

## 2020-06-17 DIAGNOSIS — G4733 Obstructive sleep apnea (adult) (pediatric): Secondary | ICD-10-CM | POA: Diagnosis not present

## 2020-06-17 DIAGNOSIS — Z96642 Presence of left artificial hip joint: Secondary | ICD-10-CM | POA: Diagnosis not present

## 2020-06-17 DIAGNOSIS — I10 Essential (primary) hypertension: Secondary | ICD-10-CM | POA: Insufficient documentation

## 2020-06-17 HISTORY — DX: Sleep apnea, unspecified: G47.30

## 2020-06-17 HISTORY — DX: Thyrotoxicosis, unspecified without thyrotoxic crisis or storm: E05.90

## 2020-06-17 HISTORY — DX: Unspecified osteoarthritis, unspecified site: M19.90

## 2020-06-17 LAB — URINALYSIS, ROUTINE W REFLEX MICROSCOPIC
Bilirubin Urine: NEGATIVE
Glucose, UA: NEGATIVE mg/dL
Hgb urine dipstick: NEGATIVE
Ketones, ur: NEGATIVE mg/dL
Nitrite: POSITIVE — AB
Protein, ur: NEGATIVE mg/dL
Specific Gravity, Urine: 1.01 (ref 1.005–1.030)
pH: 5 (ref 5.0–8.0)

## 2020-06-17 LAB — COMPREHENSIVE METABOLIC PANEL
ALT: 10 U/L (ref 0–44)
AST: 14 U/L — ABNORMAL LOW (ref 15–41)
Albumin: 4.2 g/dL (ref 3.5–5.0)
Alkaline Phosphatase: 73 U/L (ref 38–126)
Anion gap: 11 (ref 5–15)
BUN: 13 mg/dL (ref 8–23)
CO2: 25 mmol/L (ref 22–32)
Calcium: 9.4 mg/dL (ref 8.9–10.3)
Chloride: 104 mmol/L (ref 98–111)
Creatinine, Ser: 1 mg/dL (ref 0.44–1.00)
GFR, Estimated: >60 mL/min (ref >60)
Glucose, Bld: 85 mg/dL (ref 70–99)
Potassium: 3.6 mmol/L (ref 3.5–5.1)
Sodium: 140 mmol/L (ref 135–145)
Total Bilirubin: 0.8 mg/dL (ref 0.3–1.2)
Total Protein: 6.9 g/dL (ref 6.5–8.1)

## 2020-06-17 LAB — CBC
HCT: 33.3 % — ABNORMAL LOW (ref 36.0–46.0)
Hemoglobin: 11.3 g/dL — ABNORMAL LOW (ref 12.0–15.0)
MCH: 32.7 pg (ref 26.0–34.0)
MCHC: 33.9 g/dL (ref 30.0–36.0)
MCV: 96.2 fL (ref 80.0–100.0)
Platelets: 215 10*3/uL (ref 150–400)
RBC: 3.46 MIL/uL — ABNORMAL LOW (ref 3.87–5.11)
RDW: 12.1 % (ref 11.5–15.5)
WBC: 6.8 10*3/uL (ref 4.0–10.5)
nRBC: 0 % (ref 0.0–0.2)

## 2020-06-17 LAB — TYPE AND SCREEN
ABO/RH(D): A POS
Antibody Screen: NEGATIVE

## 2020-06-17 LAB — PROTIME-INR
INR: 1 (ref 0.8–1.2)
Prothrombin Time: 13 seconds (ref 11.4–15.2)

## 2020-06-17 LAB — SURGICAL PCR SCREEN
MRSA, PCR: NEGATIVE
Staphylococcus aureus: POSITIVE — AB

## 2020-06-17 NOTE — Progress Notes (Signed)
UA results sent to Dr. Linna Caprice to review.

## 2020-06-17 NOTE — Progress Notes (Signed)
PCR screen sent to Dr. Swinteck to review. 

## 2020-06-20 NOTE — Anesthesia Preprocedure Evaluation (Addendum)
Anesthesia Evaluation  Patient identified by MRN, date of birth, ID band Patient awake    Reviewed: Allergy & Precautions, NPO status , Patient's Chart, lab work & pertinent test results  Airway Mallampati: II  TM Distance: >3 FB Neck ROM: Full    Dental no notable dental hx.    Pulmonary sleep apnea , former smoker,    Pulmonary exam normal breath sounds clear to auscultation       Cardiovascular hypertension, Normal cardiovascular exam Rhythm:Regular Rate:Normal     Neuro/Psych Bipolar Disorder negative neurological ROS     GI/Hepatic negative GI ROS, Neg liver ROS,   Endo/Other  Hyperthyroidism   Renal/GU negative Renal ROS  negative genitourinary   Musculoskeletal negative musculoskeletal ROS (+)   Abdominal   Peds negative pediatric ROS (+)  Hematology negative hematology ROS (+)   Anesthesia Other Findings   Reproductive/Obstetrics negative OB ROS                           Anesthesia Physical Anesthesia Plan  ASA: II  Anesthesia Plan: Spinal   Post-op Pain Management:    Induction: Intravenous  PONV Risk Score and Plan: 2 and Ondansetron, Dexamethasone and Treatment may vary due to age or medical condition  Airway Management Planned: Simple Face Mask  Additional Equipment:   Intra-op Plan:   Post-operative Plan:   Informed Consent: I have reviewed the patients History and Physical, chart, labs and discussed the procedure including the risks, benefits and alternatives for the proposed anesthesia with the patient or authorized representative who has indicated his/her understanding and acceptance.     Dental advisory given  Plan Discussed with: CRNA and Surgeon  Anesthesia Plan Comments: (See APP note by Joslyn Hy, FNP )       Anesthesia Quick Evaluation

## 2020-06-20 NOTE — Progress Notes (Signed)
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 RN routed results to surgeon.    VS: BP 129/Heather   Pulse 82   Temp 36.8 C (Oral)   Resp 14   Ht 5\' 4"  (1.626 m)   Wt 72 kg   BMI 27.26 kg/m     PROVIDERS: - PCP is , PA-C - Cardiologist is Roger Kill, DO. Last office visit 03/23/20.    LABS: Labs reviewed: Acceptable for surgery.  - RN routed UA results to surgeon  (all labs ordered are listed, but only abnormal results are displayed)  Labs Reviewed  SURGICAL PCR SCREEN - Abnormal; Notable for the following components:      Result Value   Staphylococcus aureus POSITIVE (*)    All other components within normal limits  CBC - Abnormal; Notable for the following components:   RBC 3.46 (*)    Hemoglobin 11.3 (*)    HCT 33.3 (*)    All other components within normal limits  COMPREHENSIVE METABOLIC PANEL - Abnormal; Notable for the following components:   AST 14 (*)    All other components within normal limits  URINALYSIS, ROUTINE W REFLEX MICROSCOPIC - Abnormal; Notable for the following components:   APPearance HAZY (*)    Nitrite POSITIVE (*)    Leukocytes,Ua LARGE (*)    Bacteria, UA MANY (*)    All other components within normal limits  PROTIME-INR  TYPE AND SCREEN     EKG 03/27/20: Sinus bradycardia, 52 bpm, left axis deviation, poor R wave progression, without underlying ischemia or injury pattern.   CV:  Long term event monitor 03/23/20:   3 day extended Holter monitor:  Dominant rhythm normal sinus  rhythm.  Heart rate 34-113 bpm.  Average heart rate 57 bpm.  No atrial fibrillation/atrial flutter/supraventricular tachycardia/ventricular tachycardia/high grade AV block, sinus pause greater than or equal to 3 seconds in duration.  Total ventricular ectopic burden <1.0%.  Total supraventricular ectopic burden <1.0%.  1 episode reported as supraventricular tachycardia at 4:57 AM; however, reviewed rhythm strip notes sinus mechanism with blocked premature atrial contraction.  Number of patient triggered events: 0.    Nuclear stress test 03/07/20:   Lexiscan nuclear stress test performed using 1-day protocol.  Normal myocardial perfusion. Stress LVEF 63%.  Low risk study   Echo 02/18/20:   Normal LV systolic function with visual EF 55-60%. Left ventricle cavity  is normal in size. Normal global wall motion. Doppler evidence of grade II  (pseudonormal) diastolic dysfunction, normal LAP. Calculated EF 55%.   Left atrial cavity is severely dilated.   Mild (Grade I) aortic regurgitation.   Mild (Grade I) mitral regurgitation.   Mild tricuspid regurgitation. No evidence of pulmonary hypertension.   No prior study for comparison.   Past Medical History:  Diagnosis Date  . Anxiety   . Arthritis   . Bradycardia   . Depression   . Hypertension   . Hyperthyroidism   . Migraine   . Sleep apnea    No  CPAP  . Thyroid disease    hyperthyroid    Past Surgical History:  Procedure Laterality Date  . ABDOMINAL HYSTERECTOMY    . ABLATION SAPHENOUS VEIN W/ RFA Right   . BLADDER SURGERY     Bladder Tack  . BUNIONECTOMY Bilateral   . HAMMER TOE SURGERY Bilateral   . JOINT REPLACEMENT Left 2008  . ROTATOR CUFF REPAIR Right     MEDICATIONS: . alendronate (FOSAMAX) 70 MG tablet  . ALPRAZolam (XANAX) 0.5 MG tablet  . amLODipine (NORVASC) 5 MG tablet  . Calcium-Magnesium-Vitamin D (CALCIUM 1200+D3 PO)  . hydrochlorothiazide (HYDRODIURIL) 25 MG tablet  . lamoTRIgine  (LAMICTAL) 100 MG tablet  . losartan (COZAAR) 100 MG tablet  . magnesium gluconate (MAGONATE) 500 MG tablet  . QUEtiapine (SEROQUEL) 100 MG tablet  . rizatriptan (MAXALT) 10 MG tablet  . sertraline (ZOLOFT) 50 MG tablet  . topiramate (TOPAMAX) 200 MG tablet  . vitamin B-12 (CYANOCOBALAMIN) 500 MCG tablet  . VITAMIN D PO   No current facility-administered medications for this encounter.    If no changes, I anticipate pt can proceed with surgery as scheduled.   Rica Mast, PhD, FNP-BC [facility]/Anesthesiology Phone: [phone] 06/20/2020 3:01 PM

## 2020-06-21 ENCOUNTER — Ambulatory Visit: Payer: Self-pay | Admitting: Student

## 2020-06-21 NOTE — H&P (Signed)
TOTAL HIP ADMISSION H&P  Patient is admitted for right total hip arthroplasty.  Subjective:  Chief Complaint: right hip pain  HPI: Makynzee Tigges Cutter, 69 y.o. female, has a history of pain and functional disability in the right hip(s) due to arthritis and patient has failed non-surgical conservative treatments for greater than 12 weeks to include NSAID's and/or analgesics, corticosteriod injections and activity modification.  Onset of symptoms was gradual starting 5 years ago with gradually worsening course since that time.The patient noted no past surgery on the right hip(s).  Patient currently rates pain in the right hip at 8 out of 10 with activity. Patient has worsening of pain with activity and weight bearing, pain that interfers with activities of daily living and pain with passive range of motion. Patient has evidence of subchondral cysts, subchondral sclerosis and joint space narrowing by imaging studies. This condition presents safety issues increasing the risk of falls. There is no current active infection.  Patient Active Problem List   Diagnosis Date Noted  . Bradycardia   . Bipolar I disorder, most recent episode depressed (HCC) 03/04/2014  . Anxiety state, unspecified 03/04/2014   Past Medical History:  Diagnosis Date  . Anxiety   . Arthritis   . Bradycardia   . Depression   . Hypertension   . Hyperthyroidism   . Migraine   . Sleep apnea    No CPAP  . Thyroid disease    hyperthyroid    Past Surgical History:  Procedure Laterality Date  . ABDOMINAL HYSTERECTOMY    . ABLATION SAPHENOUS VEIN W/ RFA Right   . BLADDER SURGERY     Bladder Tack  . BUNIONECTOMY Bilateral   . HAMMER TOE SURGERY Bilateral   . JOINT REPLACEMENT Left 2008  . ROTATOR CUFF REPAIR Right     Current Outpatient Medications  Medication Sig Dispense Refill Last Dose  . alendronate (FOSAMAX) 70 MG tablet Take 70 mg by mouth once a week. Take with a full glass of water on an empty stomach.     . ALPRAZolam  (XANAX) 0.5 MG tablet Take 0.5 mg by mouth 2 (two) times daily as needed for anxiety.      Marland Kitchen amLODipine (NORVASC) 5 MG tablet Take 5 mg by mouth daily.     . Calcium-Magnesium-Vitamin D (CALCIUM 1200+D3 PO) Take 1 tablet by mouth daily.     . hydrochlorothiazide (HYDRODIURIL) 25 MG tablet Take 25 mg by mouth daily.     Marland Kitchen lamoTRIgine (LAMICTAL) 100 MG tablet Take 100 mg by mouth daily.     Marland Kitchen losartan (COZAAR) 100 MG tablet Take 100 mg by mouth every morning.     . magnesium gluconate (MAGONATE) 500 MG tablet Take 500 mg by mouth daily.     . QUEtiapine (SEROQUEL) 100 MG tablet Take 100-200 mg by mouth at bedtime.      . rizatriptan (MAXALT) 10 MG tablet Take 10 mg by mouth as needed for migraine. May repeat in 2 hours if needed     . sertraline (ZOLOFT) 50 MG tablet Take 50 mg by mouth daily.     Marland Kitchen topiramate (TOPAMAX) 200 MG tablet Take 200 mg by mouth daily.      . vitamin B-12 (CYANOCOBALAMIN) 500 MCG tablet Take 500 mcg by mouth daily. (Patient not taking: Reported on 06/14/2020)     . VITAMIN D PO Take by mouth. (Patient not taking: Reported on 06/14/2020)      No current facility-administered medications for this visit.  Allergies  Allergen Reactions  . Tetracyclines & Related Shortness Of Breath and Palpitations  . Sulfa Antibiotics     Leg cramping    Social History   Tobacco Use  . Smoking status: Former Smoker    Packs/day: 2.00    Years: 30.00    Pack years: 60.00    Types: Cigarettes    Quit date: 10/16/1988    Years since quitting: 31.7  . Smokeless tobacco: Never Used  Substance Use Topics  . Alcohol use: Yes    Alcohol/week: 0.0 standard drinks    Comment: Ocassionally "every six months or so"    Family History  Problem Relation Age of Onset  . Alcohol abuse Mother   . Alcohol abuse Father   . Schizophrenia Sister   . Alcohol abuse Sister      Review of Systems  Constitutional: Negative.   HENT:       Dentures  Eyes: Negative.   Respiratory: Negative.    Cardiovascular: Negative.   Gastrointestinal: Negative.   Endocrine: Negative.   Genitourinary: Positive for frequency.  Musculoskeletal: Positive for arthralgias.  Skin: Negative.   Allergic/Immunologic: Negative.   Neurological: Negative.   Hematological: Negative.   Psychiatric/Behavioral: Negative.     Objective:  Physical Exam Constitutional:      Appearance: Normal appearance.  HENT:     Head: Normocephalic.     Nose: Nose normal.  Eyes:     Pupils: Pupils are equal, round, and reactive to light.  Cardiovascular:     Rate and Rhythm: Normal rate. Rhythm irregular.  Pulmonary:     Breath sounds: Normal breath sounds.  Abdominal:     Palpations: Abdomen is soft.     Tenderness: There is no abdominal tenderness.  Genitourinary:    Comments: Deferred Musculoskeletal:     Cervical back: Normal range of motion.     Comments: Examination of the right hip reveals no skin wounds or lesions. No significant trochanteric tenderness to palpation. Her right hip is very stiff. She has a 20 flexion contracture, and I can flex her up to 90. She internally rotates 5, actually rotates 30. Pain with terminal flexion rotation. Pain in the position of impingement  Skin:    General: Skin is warm and dry.  Neurological:     Mental Status: She is alert and oriented to person, place, and time.  Psychiatric:        Mood and Affect: Mood normal.     Vital signs in last 24 hours: @VSRANGES @  Labs:   Estimated body mass index is 27.26 kg/m as calculated from the following:   Height as of 06/17/20: 5\' 4"  (1.626 m).   Weight as of 06/17/20: 72 kg.   Imaging Review Plain radiographs demonstrate severe degenerative joint disease of the right hip(s). The bone quality appears to be adequate for age and reported activity level.      Assessment/Plan:  End stage arthritis, right hip(s)  The patient history, physical examination, clinical judgement of the provider and imaging  studies are consistent with end stage degenerative joint disease of the right hip(s) and total hip arthroplasty is deemed medically necessary. The treatment options including medical management, injection therapy, arthroscopy and arthroplasty were discussed at length. The risks and benefits of total hip arthroplasty were presented and reviewed. The risks due to aseptic loosening, infection, stiffness, dislocation/subluxation,  thromboembolic complications and other imponderables were discussed.  The patient acknowledged the explanation, agreed to proceed with the plan and consent was  signed. Patient is being admitted for inpatient treatment for surgery, pain control, PT, OT, prophylactic antibiotics, VTE prophylaxis, progressive ambulation and ADL's and discharge planning.The patient is planning to be discharged home where she will complete a home exercise program

## 2020-06-21 NOTE — H&P (View-Only) (Signed)
TOTAL HIP ADMISSION H&P  Patient is admitted for right total hip arthroplasty.  Subjective:  Chief Complaint: right hip pain  HPI: Heather Gray, 69 y.o. female, has a history of pain and functional disability in the right hip(s) due to arthritis and patient has failed non-surgical conservative treatments for greater than 12 weeks to include NSAID's and/or analgesics, corticosteriod injections and activity modification.  Onset of symptoms was gradual starting 5 years ago with gradually worsening course since that time.The patient noted no past surgery on the right hip(s).  Patient currently rates pain in the right hip at 8 out of 10 with activity. Patient has worsening of pain with activity and weight bearing, pain that interfers with activities of daily living and pain with passive range of motion. Patient has evidence of subchondral cysts, subchondral sclerosis and joint space narrowing by imaging studies. This condition presents safety issues increasing the risk of falls. There is no current active infection.  Patient Active Problem List   Diagnosis Date Noted  . Bradycardia   . Bipolar I disorder, most recent episode depressed (HCC) 03/04/2014  . Anxiety state, unspecified 03/04/2014   Past Medical History:  Diagnosis Date  . Anxiety   . Arthritis   . Bradycardia   . Depression   . Hypertension   . Hyperthyroidism   . Migraine   . Sleep apnea    No CPAP  . Thyroid disease    hyperthyroid    Past Surgical History:  Procedure Laterality Date  . ABDOMINAL HYSTERECTOMY    . ABLATION SAPHENOUS VEIN W/ RFA Right   . BLADDER SURGERY     Bladder Tack  . BUNIONECTOMY Bilateral   . HAMMER TOE SURGERY Bilateral   . JOINT REPLACEMENT Left 2008  . ROTATOR CUFF REPAIR Right     Current Outpatient Medications  Medication Sig Dispense Refill Last Dose  . alendronate (FOSAMAX) 70 MG tablet Take 70 mg by mouth once a week. Take with a full glass of water on an empty stomach.     . ALPRAZolam  (XANAX) 0.5 MG tablet Take 0.5 mg by mouth 2 (two) times daily as needed for anxiety.      Marland Kitchen amLODipine (NORVASC) 5 MG tablet Take 5 mg by mouth daily.     . Calcium-Magnesium-Vitamin D (CALCIUM 1200+D3 PO) Take 1 tablet by mouth daily.     . hydrochlorothiazide (HYDRODIURIL) 25 MG tablet Take 25 mg by mouth daily.     Marland Kitchen lamoTRIgine (LAMICTAL) 100 MG tablet Take 100 mg by mouth daily.     Marland Kitchen losartan (COZAAR) 100 MG tablet Take 100 mg by mouth every morning.     . magnesium gluconate (MAGONATE) 500 MG tablet Take 500 mg by mouth daily.     . QUEtiapine (SEROQUEL) 100 MG tablet Take 100-200 mg by mouth at bedtime.      . rizatriptan (MAXALT) 10 MG tablet Take 10 mg by mouth as needed for migraine. May repeat in 2 hours if needed     . sertraline (ZOLOFT) 50 MG tablet Take 50 mg by mouth daily.     Marland Kitchen topiramate (TOPAMAX) 200 MG tablet Take 200 mg by mouth daily.      . vitamin B-12 (CYANOCOBALAMIN) 500 MCG tablet Take 500 mcg by mouth daily. (Patient not taking: Reported on 06/14/2020)     . VITAMIN D PO Take by mouth. (Patient not taking: Reported on 06/14/2020)      No current facility-administered medications for this visit.  Allergies  Allergen Reactions  . Tetracyclines & Related Shortness Of Breath and Palpitations  . Sulfa Antibiotics     Leg cramping    Social History   Tobacco Use  . Smoking status: Former Smoker    Packs/day: 2.00    Years: 30.00    Pack years: 60.00    Types: Cigarettes    Quit date: 10/16/1988    Years since quitting: 31.7  . Smokeless tobacco: Never Used  Substance Use Topics  . Alcohol use: Yes    Alcohol/week: 0.0 standard drinks    Comment: Ocassionally "every six months or so"    Family History  Problem Relation Age of Onset  . Alcohol abuse Mother   . Alcohol abuse Father   . Schizophrenia Sister   . Alcohol abuse Sister      Review of Systems  Constitutional: Negative.   HENT:       Dentures  Eyes: Negative.   Respiratory: Negative.    Cardiovascular: Negative.   Gastrointestinal: Negative.   Endocrine: Negative.   Genitourinary: Positive for frequency.  Musculoskeletal: Positive for arthralgias.  Skin: Negative.   Allergic/Immunologic: Negative.   Neurological: Negative.   Hematological: Negative.   Psychiatric/Behavioral: Negative.     Objective:  Physical Exam Constitutional:      Appearance: Normal appearance.  HENT:     Head: Normocephalic.     Nose: Nose normal.  Eyes:     Pupils: Pupils are equal, round, and reactive to light.  Cardiovascular:     Rate and Rhythm: Normal rate. Rhythm irregular.  Pulmonary:     Breath sounds: Normal breath sounds.  Abdominal:     Palpations: Abdomen is soft.     Tenderness: There is no abdominal tenderness.  Genitourinary:    Comments: Deferred Musculoskeletal:     Cervical back: Normal range of motion.     Comments: Examination of the right hip reveals no skin wounds or lesions. No significant trochanteric tenderness to palpation. Her right hip is very stiff. She has a 20 flexion contracture, and I can flex her up to 90. She internally rotates 5, actually rotates 30. Pain with terminal flexion rotation. Pain in the position of impingement  Skin:    General: Skin is warm and dry.  Neurological:     Mental Status: She is alert and oriented to person, place, and time.  Psychiatric:        Mood and Affect: Mood normal.     Vital signs in last 24 hours: @VSRANGES @  Labs:   Estimated body mass index is 27.26 kg/m as calculated from the following:   Height as of 06/17/20: 5\' 4"  (1.626 m).   Weight as of 06/17/20: 72 kg.   Imaging Review Plain radiographs demonstrate severe degenerative joint disease of the right hip(s). The bone quality appears to be adequate for age and reported activity level.      Assessment/Plan:  End stage arthritis, right hip(s)  The patient history, physical examination, clinical judgement of the provider and imaging  studies are consistent with end stage degenerative joint disease of the right hip(s) and total hip arthroplasty is deemed medically necessary. The treatment options including medical management, injection therapy, arthroscopy and arthroplasty were discussed at length. The risks and benefits of total hip arthroplasty were presented and reviewed. The risks due to aseptic loosening, infection, stiffness, dislocation/subluxation,  thromboembolic complications and other imponderables were discussed.  The patient acknowledged the explanation, agreed to proceed with the plan and consent was  signed. Patient is being admitted for inpatient treatment for surgery, pain control, PT, OT, prophylactic antibiotics, VTE prophylaxis, progressive ambulation and ADL's and discharge planning.The patient is planning to be discharged home where she will complete a home exercise program

## 2020-06-23 ENCOUNTER — Ambulatory Visit: Payer: Medicare HMO | Admitting: Cardiology

## 2020-06-23 ENCOUNTER — Other Ambulatory Visit: Payer: Self-pay

## 2020-06-23 ENCOUNTER — Encounter: Payer: Self-pay | Admitting: Cardiology

## 2020-06-23 VITALS — BP 132/81 | HR 54 | Ht 64.0 in | Wt 158.0 lb

## 2020-06-23 DIAGNOSIS — R001 Bradycardia, unspecified: Secondary | ICD-10-CM

## 2020-06-23 DIAGNOSIS — I10 Essential (primary) hypertension: Secondary | ICD-10-CM

## 2020-06-23 DIAGNOSIS — Z712 Person consulting for explanation of examination or test findings: Secondary | ICD-10-CM

## 2020-06-23 DIAGNOSIS — Z01810 Encounter for preprocedural cardiovascular examination: Secondary | ICD-10-CM

## 2020-06-23 DIAGNOSIS — Z87891 Personal history of nicotine dependence: Secondary | ICD-10-CM

## 2020-06-23 NOTE — Progress Notes (Signed)
Date:  06/23/2020   ID:  Heather Gray, DOB Dec 21, 1950, MRN [id number]  PCP:  Heywood Bene, PA-C  Cardiologist:  Rex Kras, DO, [facility] (established care 02/10/2020)  Date: 06/23/20 Last Office Visit: 03/23/2020  Chief Complaint  Patient presents with  . Preop Cariovascular Exam  . Follow-up    HPI  Heather Gray is a 69 y.o. female who presents to the office with a chief complaint of "preoperative risk-stratification." Patient's past medical history and cardiac risk factors include: Benign essential hypertension, former smoker, thyroid disease, anxiety, depression, postmenopausal female, advanced age.  Patient was referred to the office back in June 2021 for evaluation of bradycardia and preoperative risk stratification.  She was originally referred to the office for management of bradycardia.  Given her symptoms of feeling tired and fatigue she was also on Toprol-XL 100 mg p.o. daily.  As result beta-blocker therapy was needed and since then she is doing well.  Patient states that she continues to have more energy than before and feels great being off beta-blocker therapy.  Patient is scheduled for right hip replacement on June 29, 2020 at [facility].  She denies any chest pain or shortness of breath at rest or with effort related activities.  No recent angina pectoris or ACS.  No significant valvular heart disease.  No known history of CAD/congestive heart failure/stroke/TIA.  She recently had an echocardiogram which noted preserved LVEF without any significant valvular heart disease.  And her recent nuclear stress test from July 2021 also notes normal myocardial perfusion and overall low risk study.  I have already risk stratified her back in September to be low risk surgical candidate from a cardiovascular standpoint.  Patient finds her preoperative risk assessment acceptable to proceed with surgery next week.  FUNCTIONAL STATUS: She also enjoys doing yardwork. Stationary  bike 10-19mn three times day.  ALLERGIES: Allergies  Allergen Reactions  . Tetracyclines & Related Shortness Of Breath and Palpitations  . Sulfa Antibiotics     Leg cramping    MEDICATION LIST PRIOR TO VISIT: Current Meds  Medication Sig  . alendronate (FOSAMAX) 70 MG tablet Take 70 mg by mouth once a week. Take with a full glass of water on an empty stomach.  . ALPRAZolam (XANAX) 0.5 MG tablet Take 1.5 mg by mouth at bedtime.   .Marland KitchenamLODipine (NORVASC) 5 MG tablet Take 5 mg by mouth daily.  . Calcium-Magnesium-Vitamin D (CALCIUM 1200+D3 PO) Take 1 tablet by mouth daily.  . hydrochlorothiazide (HYDRODIURIL) 25 MG tablet Take 25 mg by mouth daily.  .Marland KitchenlamoTRIgine (LAMICTAL) 100 MG tablet Take 100 mg by mouth daily.  .Marland Kitchenlosartan (COZAAR) 100 MG tablet Take 100 mg by mouth every morning.  . magnesium gluconate (MAGONATE) 500 MG tablet Take 500 mg by mouth daily.  . QUEtiapine (SEROQUEL) 100 MG tablet Take 100 mg by mouth at bedtime.   . rizatriptan (MAXALT) 10 MG tablet Take 10 mg by mouth as needed for migraine. May repeat in 2 hours if needed  . sertraline (ZOLOFT) 50 MG tablet Take 50 mg by mouth daily.  .Marland Kitchentopiramate (TOPAMAX) 200 MG tablet Take 200 mg by mouth daily.   . vitamin B-12 (CYANOCOBALAMIN) 500 MCG tablet Take 500 mcg by mouth daily.      PAST MEDICAL HISTORY: Past Medical History:  Diagnosis Date  . Anxiety   . Arthritis   . Bradycardia   . Depression   . Hypertension   . Hyperthyroidism   .  Migraine   . Sleep apnea    No CPAP  . Thyroid disease    hyperthyroid    PAST SURGICAL HISTORY: Past Surgical History:  Procedure Laterality Date  . ABDOMINAL HYSTERECTOMY    . ABLATION SAPHENOUS VEIN W/ RFA Right   . BLADDER SURGERY     Bladder Tack  . BUNIONECTOMY Bilateral   . HAMMER TOE SURGERY Bilateral   . JOINT REPLACEMENT Left 2008  . ROTATOR CUFF REPAIR Right     FAMILY HISTORY: The patient family history includes Alcohol abuse in her father, mother,  and sister; Schizophrenia in her sister.  SOCIAL HISTORY:  The patient  reports that she quit smoking about 31 years ago. Her smoking use included cigarettes. She has a 60.00 pack-year smoking history. She has never used smokeless tobacco. She reports current alcohol use. She reports current drug use. Drug: Marijuana.  REVIEW OF SYSTEMS: Review of Systems  Constitutional: Negative for chills, fever and malaise/fatigue.  HENT: Negative for hoarse voice and nosebleeds.   Eyes: Negative for discharge, double vision and pain.  Cardiovascular: Negative for chest pain, claudication, dyspnea on exertion, leg swelling, near-syncope, orthopnea, palpitations, paroxysmal nocturnal dyspnea and syncope.  Respiratory: Negative for hemoptysis and shortness of breath.   Musculoskeletal: Positive for joint pain. Negative for muscle cramps and myalgias.  Gastrointestinal: Negative for abdominal pain, constipation, diarrhea, hematemesis, hematochezia, melena, nausea and vomiting.  Neurological: Negative for dizziness and light-headedness.    PHYSICAL EXAM: Vitals with BMI 06/23/2020 06/17/2020 03/23/2020  Height _0  _1  5' 4.5"  Weight 158 lbs 158 lbs 13 oz 156 lbs 6 oz  BMI 27.11 55.01 58.68  Systolic 257 493 552  Diastolic 81 66 69  Pulse 54 82 58  Some encounter information is confidential and restricted. Go to Review Flowsheets activity to see all data.   CONSTITUTIONAL: Well-developed and well-nourished. No acute distress.  SKIN: Skin is warm and dry. No rash noted. No cyanosis. No pallor. No jaundice HEAD: Normocephalic and atraumatic.  EYES: No scleral icterus MOUTH/THROAT: Moist oral membranes.  NECK: No JVD present. No thyromegaly noted. No carotid bruits  LYMPHATIC: No visible cervical adenopathy.  CHEST Normal respiratory effort. No intercostal retractions  LUNGS: Clear to auscultation bilaterally.  No stridor. No wheezes. No rales.  CARDIOVASCULAR: Bradycardia, positive S1-S2, no  murmurs rubs or gallops appreciated. ABDOMINAL: soft, nontender, nondistended, positive bowel sounds all 4 quadrants. No apparent ascites.  EXTREMITIES: No peripheral edema  HEMATOLOGIC: No significant bruising NEUROLOGIC: Oriented to person, place, and time. Nonfocal. Normal muscle tone.  PSYCHIATRIC: Normal mood and affect. Normal behavior. Cooperative  CARDIAC DATABASE: EKG: 06/23/2020: Sinus bradycardia, 55 bpm, left axis deviation,, left anterior fascicular block, poor R wave progression, consider old anterior infarct, without underlying injury pattern.  Lead V4 is not interpretable.  Echocardiogram: 02/18/2020:  Normal LV systolic function with visual EF 55-60%. Left ventricle cavity is normal in size. Normal global wall motion. Doppler evidence of grade II (pseudonormal) diastolic dysfunction, normal LAP. Calculated EF 55%.  Left atrial cavity is severely dilated.  Mild (Grade I) aortic regurgitation.  Mild (Grade I) mitral regurgitation.  Mild tricuspid regurgitation. No evidence of pulmonary hypertension.  No prior study for comparison.   Stress Testing: Lexiscan Tetrofosmin stress test 03/07/2020:  Lexiscan nuclear stress test performed using 1-day protocol.  Normal myocardial perfusion. Stress LVEF 63%.  Low risk study,  Heart Catheterization: None   3 day extended Holter monitor: Dominant rhythm normal sinus rhythm. Heart rate 34-113 bpm.  Average heart rate 57 bpm. No atrial fibrillation/atrial flutter/supraventricular tachycardia/ventricular tachycardia/high grade AV block, sinus pause greater than or equal to 3 seconds in duration. Total ventricular ectopic burden <1.0%. Total supraventricular ectopic burden <1.0%. 1 episode reported as supraventricular tachycardia at 4:57 AM; however, reviewed rhythm strip notes sinus mechanism with blocked premature atrial contraction. Number of patient triggered events: 0.    LABORATORY DATA: External Labs: Collected: Jan 14, 2020 at [facility] Creatinine 1.0 mg/dL. eGFR: 58 mL/min per 1.73 m at bedtime Hemoglobin A1c: 5.0  10/27/2019: [facility] TSH: 2.57   IMPRESSION:    ICD-10-CM   1. Preop cardiovascular exam  Z01.810 EKG 12-Lead  2. Bradycardia  R00.1   3. Benign hypertension  I10   4. Former smoker  Z87.891   5. Encounter to discuss test results  Z71.2      RECOMMENDATIONS: SHAKA ZECH is a 69 y.o. female whose past medical history and cardiac risk factors include: Benign essential hypertension, former smoker, thyroid disease, anxiety, depression, postmenopausal female, advanced age.   Preoperative risk stratification:   She denies any chest pain or shortness of breath at rest or with effort related activities.  No recent angina pectoris or ACS.  No significant valvular heart disease.  No known history of CAD/congestive heart failure/stroke/TIA.  She recently had an echocardiogram which noted preserved LVEF without any significant valvular heart disease.  And her recent nuclear stress test from July 2021 also notes normal myocardial perfusion and overall low risk study.  I have already risk stratified her back in September to be low risk surgical candidate from a cardiovascular standpoint.  Patient finds her preoperative risk assessment acceptable to proceed with surgery next week.  EKG performed today notes sinus mechanism without underlying injury pattern.  Bradycardia: Asymptomatic and stable.  Patient is now asymptomatic in regards to no longer feeling tired and fatigue.  She has more energy and is compared to the last office visit.    Patient has had a extended Holter monitor since last visit.  Average heart rate was 57 bpm.  No significant ventricular ectopic burden or supraventricular ectopic burden.  Results reviewed and noted above for further reference.  Repeat TSH within normal limits.   Echo and stress test results reviewed and noted above.    LVEF is preserved and Stress test was low risk.   Former smoker: Educated on the importance of continued smoking cessation.  FINAL MEDICATION LIST END OF ENCOUNTER: No orders of the defined types were placed in this encounter.   Current Outpatient Medications:  .  alendronate (FOSAMAX) 70 MG tablet, Take 70 mg by mouth once a week. Take with a full glass of water on an empty stomach., Disp: , Rfl:  .  ALPRAZolam (XANAX) 0.5 MG tablet, Take 1.5 mg by mouth at bedtime. , Disp: , Rfl:  .  amLODipine (NORVASC) 5 MG tablet, Take 5 mg by mouth daily., Disp: , Rfl:  .  Calcium-Magnesium-Vitamin D (CALCIUM 1200+D3 PO), Take 1 tablet by mouth daily., Disp: , Rfl:  .  hydrochlorothiazide (HYDRODIURIL) 25 MG tablet, Take 25 mg by mouth daily., Disp: , Rfl:  .  lamoTRIgine (LAMICTAL) 100 MG tablet, Take 100 mg by mouth daily., Disp: , Rfl:  .  losartan (COZAAR) 100 MG tablet, Take 100 mg by mouth every morning., Disp: , Rfl:  .  magnesium gluconate (MAGONATE) 500 MG tablet, Take 500 mg by mouth daily., Disp: , Rfl:  .  QUEtiapine (SEROQUEL) 100 MG tablet, Take 100 mg by mouth at bedtime. , Disp: , Rfl:  .  rizatriptan (MAXALT) 10 MG tablet, Take 10 mg by mouth as needed for migraine. May repeat in 2 hours if needed, Disp: , Rfl:  .  sertraline (ZOLOFT) 50 MG tablet, Take 50 mg by mouth daily., Disp: , Rfl:  .  topiramate (TOPAMAX) 200 MG tablet, Take 200 mg by mouth daily. , Disp: , Rfl:  .  vitamin B-12 (CYANOCOBALAMIN) 500 MCG tablet, Take 500 mcg by mouth daily. , Disp: , Rfl:  .  ibuprofen (ADVIL) 800 MG tablet, Take 800 mg by mouth as needed., Disp: , Rfl:   Orders Placed This Encounter  Procedures  . EKG 12-Lead    There are no Patient Instructions on file for this visit.  --Continue cardiac medications as reconciled in final medication list. --Return in about 6 months (around 12/22/2020) for Reevaluation of bradycardia and post-op. Or sooner if needed. --Continue follow-up with your  primary care physician regarding the management of your other chronic comorbid conditions.  Patient's questions and concerns were addressed to her satisfaction. She voices understanding of the instructions provided during this encounter.   This note was created using a voice recognition software as a result there may be grammatical errors inadvertently enclosed that do not reflect the nature of this encounter. Every attempt is made to correct such errors.  Rex Kras, [place], [facility]  Pager: [phone] Office: [phone]

## 2020-06-24 NOTE — Telephone Encounter (Signed)
Please speak with front desk, if there is an alternative.

## 2020-06-24 NOTE — Telephone Encounter (Signed)
From patient.

## 2020-06-25 ENCOUNTER — Other Ambulatory Visit (HOSPITAL_COMMUNITY)
Admission: RE | Admit: 2020-06-25 | Discharge: 2020-06-25 | Disposition: A | Discharge status: Home / Self Care | Payer: Medicare HMO | Source: Ambulatory Visit | Attending: Orthopedic Surgery | Admitting: Orthopedic Surgery

## 2020-06-25 DIAGNOSIS — Z01812 Encounter for preprocedural laboratory examination: Secondary | ICD-10-CM | POA: Insufficient documentation

## 2020-06-25 DIAGNOSIS — Z20822 Contact with and (suspected) exposure to covid-19: Secondary | ICD-10-CM | POA: Insufficient documentation

## 2020-06-26 LAB — SARS CORONAVIRUS 2 (TAT 6-24 HRS): SARS Coronavirus 2: NEGATIVE

## 2020-06-27 NOTE — Telephone Encounter (Signed)
Do you know what he is talking about, by any chance?

## 2020-06-29 ENCOUNTER — Encounter (HOSPITAL_COMMUNITY)
Admission: RE | Disposition: A | Discharge status: Home / Self Care | Payer: Self-pay | Source: Home / Self Care | Attending: Orthopedic Surgery

## 2020-06-29 ENCOUNTER — Encounter (HOSPITAL_COMMUNITY): Payer: Self-pay | Admitting: Orthopedic Surgery

## 2020-06-29 ENCOUNTER — Ambulatory Visit (HOSPITAL_COMMUNITY): Payer: Medicare HMO

## 2020-06-29 ENCOUNTER — Ambulatory Visit (HOSPITAL_COMMUNITY)
Admission: RE | Admit: 2020-06-29 | Discharge: 2020-06-29 | Disposition: A | Discharge status: Home / Self Care | Payer: Medicare HMO | Attending: Orthopedic Surgery | Admitting: Orthopedic Surgery

## 2020-06-29 ENCOUNTER — Ambulatory Visit (HOSPITAL_COMMUNITY): Payer: Medicare HMO | Admitting: Emergency Medicine

## 2020-06-29 DIAGNOSIS — Z87891 Personal history of nicotine dependence: Secondary | ICD-10-CM | POA: Diagnosis not present

## 2020-06-29 DIAGNOSIS — Z881 Allergy status to other antibiotic agents status: Secondary | ICD-10-CM | POA: Insufficient documentation

## 2020-06-29 DIAGNOSIS — Z09 Encounter for follow-up examination after completed treatment for conditions other than malignant neoplasm: Secondary | ICD-10-CM

## 2020-06-29 DIAGNOSIS — M1611 Unilateral primary osteoarthritis, right hip: Secondary | ICD-10-CM | POA: Diagnosis present

## 2020-06-29 DIAGNOSIS — Z419 Encounter for procedure for purposes other than remedying health state, unspecified: Secondary | ICD-10-CM

## 2020-06-29 DIAGNOSIS — Z882 Allergy status to sulfonamides status: Secondary | ICD-10-CM | POA: Insufficient documentation

## 2020-06-29 HISTORY — PX: TOTAL HIP ARTHROPLASTY: SHX124

## 2020-06-29 SURGERY — ARTHROPLASTY, HIP, TOTAL, ANTERIOR APPROACH
Anesthesia: Spinal | Site: Hip | Laterality: Right

## 2020-06-29 MED ORDER — LACTATED RINGERS IV BOLUS
250.0000 mL | Freq: Once | INTRAVENOUS | Status: AC
  Administered 2020-06-29: 250 mL via INTRAVENOUS

## 2020-06-29 MED ORDER — ORAL CARE MOUTH RINSE: 15.0000 mL | Freq: Once | OROMUCOSAL | Status: AC

## 2020-06-29 MED ORDER — HYDROMORPHONE HCL 1 MG/ML IJ SOLN: 0.2500 mg | INTRAMUSCULAR | Status: DC | PRN

## 2020-06-29 MED ORDER — BUPIVACAINE IN DEXTROSE 0.75-8.25 % IT SOLN
INTRATHECAL | Status: DC | PRN
  Administered 2020-06-29: 15 mg via INTRATHECAL

## 2020-06-29 MED ORDER — BUPIVACAINE HCL 0.25 % IJ SOLN
INTRAMUSCULAR | Status: AC
  Filled 2020-06-29: qty 1

## 2020-06-29 MED ORDER — ASPIRIN 81 MG PO CHEW: 81.0000 mg | CHEWABLE_TABLET | Freq: Two times a day (BID) | ORAL | 0 refills | Status: AC

## 2020-06-29 MED ORDER — ISOPROPYL ALCOHOL 70 % SOLN
Status: DC | PRN
  Administered 2020-06-29: 1 via TOPICAL

## 2020-06-29 MED ORDER — KETOROLAC TROMETHAMINE 30 MG/ML IJ SOLN
INTRAMUSCULAR | Status: AC
  Filled 2020-06-29: qty 1

## 2020-06-29 MED ORDER — FENTANYL CITRATE (PF) 100 MCG/2ML IJ SOLN
INTRAMUSCULAR | Status: DC | PRN
  Administered 2020-06-29 (×2): 50 ug via INTRAVENOUS

## 2020-06-29 MED ORDER — FENTANYL CITRATE (PF) 100 MCG/2ML IJ SOLN
INTRAMUSCULAR | Status: AC
  Filled 2020-06-29: qty 2

## 2020-06-29 MED ORDER — CHLORHEXIDINE GLUCONATE 0.12 % MT SOLN
15.0000 mL | Freq: Once | OROMUCOSAL | Status: AC
  Administered 2020-06-29: 15 mL via OROMUCOSAL

## 2020-06-29 MED ORDER — HYDROCODONE-ACETAMINOPHEN 5-325 MG PO TABS
1.0000 | ORAL_TABLET | ORAL | Status: DC | PRN
  Administered 2020-06-29: 2 via ORAL

## 2020-06-29 MED ORDER — IRRISEPT - 450ML BOTTLE WITH 0.05% CHG IN STERILE WATER, USP 99.95% OPTIME
TOPICAL | Status: DC | PRN
  Administered 2020-06-29: 450 mL

## 2020-06-29 MED ORDER — ALBUMIN HUMAN 5 % IV SOLN
INTRAVENOUS | Status: AC
  Filled 2020-06-29: qty 250

## 2020-06-29 MED ORDER — LACTATED RINGERS IV SOLN: INTRAVENOUS | Status: DC

## 2020-06-29 MED ORDER — LIDOCAINE 2% (20 MG/ML) 5 ML SYRINGE
INTRAMUSCULAR | Status: AC
  Filled 2020-06-29: qty 5

## 2020-06-29 MED ORDER — ATROPINE SULFATE 1 MG/10ML IJ SOSY
PREFILLED_SYRINGE | INTRAMUSCULAR | Status: AC
  Filled 2020-06-29: qty 10

## 2020-06-29 MED ORDER — CEFAZOLIN SODIUM-DEXTROSE 2-4 GM/100ML-% IV SOLN
INTRAVENOUS | Status: AC
  Filled 2020-06-29: qty 100

## 2020-06-29 MED ORDER — LACTATED RINGERS IV BOLUS
500.0000 mL | Freq: Once | INTRAVENOUS | Status: AC
  Administered 2020-06-29: 500 mL via INTRAVENOUS

## 2020-06-29 MED ORDER — ONDANSETRON HCL 4 MG PO TABS: 4.0000 mg | ORAL_TABLET | Freq: Three times a day (TID) | ORAL | 0 refills | Status: DC | PRN

## 2020-06-29 MED ORDER — CEFAZOLIN SODIUM-DEXTROSE 2-4 GM/100ML-% IV SOLN
2.0000 g | INTRAVENOUS | Status: AC
  Administered 2020-06-29: 2 g via INTRAVENOUS
  Filled 2020-06-29: qty 100

## 2020-06-29 MED ORDER — METHOCARBAMOL 500 MG PO TABS
500.0000 mg | ORAL_TABLET | Freq: Four times a day (QID) | ORAL | Status: DC | PRN
  Administered 2020-06-29: 500 mg via ORAL

## 2020-06-29 MED ORDER — ACETAMINOPHEN 10 MG/ML IV SOLN
1000.0000 mg | Freq: Once | INTRAVENOUS | Status: AC
  Administered 2020-06-29: 1000 mg via INTRAVENOUS
  Filled 2020-06-29: qty 100

## 2020-06-29 MED ORDER — SODIUM CHLORIDE 0.9 % IR SOLN
Status: DC | PRN
  Administered 2020-06-29: 1000 mL

## 2020-06-29 MED ORDER — HYDROCODONE-ACETAMINOPHEN 5-325 MG PO TABS
ORAL_TABLET | ORAL | Status: AC
  Filled 2020-06-29: qty 2

## 2020-06-29 MED ORDER — POVIDONE-IODINE 10 % EX SWAB: 2.0000 "application " | Freq: Once | CUTANEOUS | Status: DC

## 2020-06-29 MED ORDER — PHENYLEPHRINE HCL-NACL 10-0.9 MG/250ML-% IV SOLN
INTRAVENOUS | Status: DC | PRN
  Administered 2020-06-29: 30 ug/min via INTRAVENOUS

## 2020-06-29 MED ORDER — PROPOFOL 500 MG/50ML IV EMUL
INTRAVENOUS | Status: DC | PRN
  Administered 2020-06-29: 50 ug/kg/min via INTRAVENOUS

## 2020-06-29 MED ORDER — ATROPINE SULFATE 1 MG/ML IJ SOLN
INTRAMUSCULAR | Status: DC | PRN
  Administered 2020-06-29: .2 mg via INTRAVENOUS
  Administered 2020-06-29: .1 mg via INTRAVENOUS

## 2020-06-29 MED ORDER — ONDANSETRON HCL 4 MG/2ML IJ SOLN
INTRAMUSCULAR | Status: DC | PRN
  Administered 2020-06-29: 4 mg via INTRAVENOUS

## 2020-06-29 MED ORDER — CEFAZOLIN SODIUM-DEXTROSE 2-4 GM/100ML-% IV SOLN
2.0000 g | Freq: Four times a day (QID) | INTRAVENOUS | Status: DC
  Administered 2020-06-29: 2 g via INTRAVENOUS

## 2020-06-29 MED ORDER — DEXAMETHASONE SODIUM PHOSPHATE 10 MG/ML IJ SOLN
INTRAMUSCULAR | Status: DC | PRN
  Administered 2020-06-29: 8 mg via INTRAVENOUS

## 2020-06-29 MED ORDER — DOCUSATE SODIUM 100 MG PO CAPS: 100.0000 mg | ORAL_CAPSULE | Freq: Two times a day (BID) | ORAL | 1 refills | Status: AC

## 2020-06-29 MED ORDER — PHENYLEPHRINE HCL (PRESSORS) 10 MG/ML IV SOLN
INTRAVENOUS | Status: AC
  Filled 2020-06-29: qty 1

## 2020-06-29 MED ORDER — BUPIVACAINE HCL (PF) 0.25 % IJ SOLN
INTRAMUSCULAR | Status: DC | PRN
  Administered 2020-06-29: 30 mL

## 2020-06-29 MED ORDER — ALBUMIN HUMAN 5 % IV SOLN: INTRAVENOUS | Status: DC | PRN

## 2020-06-29 MED ORDER — GLYCOPYRROLATE 0.2 MG/ML IJ SOLN
INTRAMUSCULAR | Status: DC | PRN
  Administered 2020-06-29: .2 mg via INTRAVENOUS

## 2020-06-29 MED ORDER — DEXAMETHASONE SODIUM PHOSPHATE 10 MG/ML IJ SOLN
INTRAMUSCULAR | Status: AC
  Filled 2020-06-29: qty 1

## 2020-06-29 MED ORDER — METHOCARBAMOL 500 MG PO TABS
ORAL_TABLET | ORAL | Status: AC
  Filled 2020-06-29: qty 1

## 2020-06-29 MED ORDER — ONDANSETRON HCL 4 MG/2ML IJ SOLN: 4.0000 mg | Freq: Once | INTRAMUSCULAR | Status: DC | PRN

## 2020-06-29 MED ORDER — SENNA 8.6 MG PO TABS: 2.0000 | ORAL_TABLET | Freq: Every day | ORAL | 1 refills | Status: AC

## 2020-06-29 MED ORDER — POVIDONE-IODINE 10 % EX SWAB
2.0000 "application " | Freq: Once | CUTANEOUS | Status: AC
  Administered 2020-06-29: 2 via TOPICAL

## 2020-06-29 MED ORDER — TRANEXAMIC ACID-NACL 1000-0.7 MG/100ML-% IV SOLN
1000.0000 mg | INTRAVENOUS | Status: AC
  Administered 2020-06-29: 1000 mg via INTRAVENOUS
  Filled 2020-06-29: qty 100

## 2020-06-29 MED ORDER — WATER FOR IRRIGATION, STERILE IR SOLN
Status: DC | PRN
  Administered 2020-06-29: 2000 mL

## 2020-06-29 MED ORDER — MIDAZOLAM HCL 5 MG/5ML IJ SOLN
INTRAMUSCULAR | Status: DC | PRN
  Administered 2020-06-29 (×2): 1 mg via INTRAVENOUS

## 2020-06-29 MED ORDER — SODIUM CHLORIDE 0.9 % IV SOLN: INTRAVENOUS | Status: DC

## 2020-06-29 MED ORDER — SODIUM CHLORIDE (PF) 0.9 % IJ SOLN
INTRAMUSCULAR | Status: AC
  Filled 2020-06-29: qty 50

## 2020-06-29 MED ORDER — EPHEDRINE SULFATE 50 MG/ML IJ SOLN
INTRAMUSCULAR | Status: DC | PRN
  Administered 2020-06-29: 10 mg via INTRAVENOUS
  Administered 2020-06-29 (×3): 5 mg via INTRAVENOUS
  Administered 2020-06-29 (×2): 10 mg via INTRAVENOUS

## 2020-06-29 MED ORDER — METHOCARBAMOL 500 MG IVPB - SIMPLE MED: 500.0000 mg | Freq: Four times a day (QID) | INTRAVENOUS | Status: DC | PRN

## 2020-06-29 MED ORDER — PROPOFOL 500 MG/50ML IV EMUL
INTRAVENOUS | Status: AC
  Filled 2020-06-29: qty 50

## 2020-06-29 MED ORDER — HYDROCODONE-ACETAMINOPHEN 5-325 MG PO TABS
1.0000 | ORAL_TABLET | ORAL | 0 refills | Status: DC | PRN
Start: 2020-06-29 — End: 2020-12-23

## 2020-06-29 MED ORDER — EPHEDRINE 5 MG/ML INJ
INTRAVENOUS | Status: AC
  Filled 2020-06-29: qty 10

## 2020-06-29 MED ORDER — MIDAZOLAM HCL 2 MG/2ML IJ SOLN
INTRAMUSCULAR | Status: AC
  Filled 2020-06-29: qty 2

## 2020-06-29 MED ORDER — KETOROLAC TROMETHAMINE 30 MG/ML IJ SOLN
INTRAMUSCULAR | Status: DC | PRN
  Administered 2020-06-29: 30 mg

## 2020-06-29 MED ORDER — LIDOCAINE HCL (CARDIAC) PF 100 MG/5ML IV SOSY
PREFILLED_SYRINGE | INTRAVENOUS | Status: DC | PRN
  Administered 2020-06-29: 30 mg via INTRAVENOUS

## 2020-06-29 MED ORDER — SODIUM CHLORIDE (PF) 0.9 % IJ SOLN
INTRAMUSCULAR | Status: DC | PRN
  Administered 2020-06-29: 30 mL

## 2020-06-29 SURGICAL SUPPLY — 66 items
ADH SKN CLS APL DERMABOND .7 (GAUZE/BANDAGES/DRESSINGS) ×1
APL PRP STRL LF DISP 70% ISPRP (MISCELLANEOUS) ×1
BAG DECANTER FOR FLEXI CONT (MISCELLANEOUS) IMPLANT
BAG SPEC THK2 15X12 ZIP CLS (MISCELLANEOUS)
BAG ZIPLOCK 12X15 (MISCELLANEOUS) IMPLANT
BLADE SURG SZ10 CARB STEEL (BLADE) IMPLANT
CHLORAPREP W/TINT 26 (MISCELLANEOUS) ×3 IMPLANT
COVER PERINEAL POST (MISCELLANEOUS) ×3 IMPLANT
COVER SURGICAL LIGHT HANDLE (MISCELLANEOUS) ×3 IMPLANT
COVER WAND RF STERILE (DRAPES) IMPLANT
CUP ACET PNNCL SECTR W/GRIP 56 (Hips) IMPLANT
DECANTER SPIKE VIAL GLASS SM (MISCELLANEOUS) ×3 IMPLANT
DERMABOND ADVANCED (GAUZE/BANDAGES/DRESSINGS) ×2
DERMABOND ADVANCED .7 DNX12 (GAUZE/BANDAGES/DRESSINGS) ×2 IMPLANT
DRAPE IMP U-DRAPE 54X76 (DRAPES) ×3 IMPLANT
DRAPE SHEET LG 3/4 BI-LAMINATE (DRAPES) ×9 IMPLANT
DRAPE STERI IOBAN 125X83 (DRAPES) IMPLANT
DRAPE U-SHAPE 47X51 STRL (DRAPES) ×6 IMPLANT
DRSG AQUACEL AG ADV 3.5X10 (GAUZE/BANDAGES/DRESSINGS) ×3 IMPLANT
ELECT REM PT RETURN 15FT ADLT (MISCELLANEOUS) ×3 IMPLANT
GAUZE SPONGE 4X4 12PLY STRL (GAUZE/BANDAGES/DRESSINGS) ×3 IMPLANT
GLOVE BIO SURGEON STRL SZ8.5 (GLOVE) ×6 IMPLANT
GLOVE BIOGEL M STRL SZ7.5 (GLOVE) ×6 IMPLANT
GLOVE BIOGEL PI IND STRL 8 (GLOVE) ×1 IMPLANT
GLOVE BIOGEL PI IND STRL 8.5 (GLOVE) ×1 IMPLANT
GLOVE BIOGEL PI INDICATOR 8 (GLOVE) ×2
GLOVE BIOGEL PI INDICATOR 8.5 (GLOVE) ×2
GOWN SPEC L3 XXLG W/TWL (GOWN DISPOSABLE) ×3 IMPLANT
GOWN STRL REUS W/ TWL LRG LVL3 (GOWN DISPOSABLE) ×1 IMPLANT
GOWN STRL REUS W/TWL LRG LVL3 (GOWN DISPOSABLE) ×3
HANDPIECE INTERPULSE COAX TIP (DISPOSABLE) ×3
HEAD CERAMIC 36 PLUS5 (Hips) ×2 IMPLANT
HOLDER FOLEY CATH W/STRAP (MISCELLANEOUS) ×3 IMPLANT
HOOD PEEL AWAY FLYTE STAYCOOL (MISCELLANEOUS) ×12 IMPLANT
JET LAVAGE IRRISEPT WOUND (IRRIGATION / IRRIGATOR) ×3
KIT TURNOVER KIT A (KITS) IMPLANT
LAVAGE JET IRRISEPT WOUND (IRRIGATION / IRRIGATOR) ×1 IMPLANT
LINER NEUTRAL 52MMX36MMX56N (Liner) ×2 IMPLANT
MANIFOLD NEPTUNE II (INSTRUMENTS) ×3 IMPLANT
MARKER SKIN DUAL TIP RULER LAB (MISCELLANEOUS) ×3 IMPLANT
NDL SAFETY ECLIPSE 18X1.5 (NEEDLE) ×1 IMPLANT
NDL SPNL 18GX3.5 QUINCKE PK (NEEDLE) ×1 IMPLANT
NEEDLE HYPO 18GX1.5 SHARP (NEEDLE) ×3
NEEDLE SPNL 18GX3.5 QUINCKE PK (NEEDLE) ×3 IMPLANT
PACK ANTERIOR HIP CUSTOM (KITS) ×3 IMPLANT
PENCIL SMOKE EVACUATOR (MISCELLANEOUS) IMPLANT
PINN SECTOR W/GRIP ACE CUP 56 (Hips) ×3 IMPLANT
SAW OSC TIP CART 19.5X105X1.3 (SAW) ×3 IMPLANT
SCREW 6.5MMX30MM (Screw) ×2 IMPLANT
SEALER BIPOLAR AQUA 6.0 (INSTRUMENTS) ×3 IMPLANT
SET HNDPC FAN SPRY TIP SCT (DISPOSABLE) ×1 IMPLANT
STEM TRI LOC BPS GRIPTON SZ 5 (Hips) IMPLANT
SUT ETHIBOND NAB CT1 #1 30IN (SUTURE) ×6 IMPLANT
SUT MNCRL AB 3-0 PS2 18 (SUTURE) ×3 IMPLANT
SUT MNCRL AB 4-0 PS2 18 (SUTURE) ×3 IMPLANT
SUT MON AB 2-0 CT1 36 (SUTURE) ×6 IMPLANT
SUT STRATAFIX PDO 1 14 VIOLET (SUTURE) ×3
SUT STRATFX PDO 1 14 VIOLET (SUTURE) ×1
SUT VIC AB 2-0 CT1 27 (SUTURE) ×3
SUT VIC AB 2-0 CT1 TAPERPNT 27 (SUTURE) ×1 IMPLANT
SUTURE STRATFX PDO 1 14 VIOLET (SUTURE) ×1 IMPLANT
SYR 3ML LL SCALE MARK (SYRINGE) ×3 IMPLANT
TRAY FOLEY MTR SLVR 16FR STAT (SET/KITS/TRAYS/PACK) IMPLANT
TRI LOC BPS W GRIPTON SZ 5 (Hips) ×3 IMPLANT
TUBE SUCTION HIGH CAP CLEAR NV (SUCTIONS) ×3 IMPLANT
WATER STERILE IRR 1000ML POUR (IV SOLUTION) ×3 IMPLANT

## 2020-06-29 NOTE — Interval H&P Note (Signed)
History and Physical Interval Note:  06/29/2020 8:09 AM  Heather Gray  has presented today for surgery, with the diagnosis of degenerative joint disease right hip.  The various methods of treatment have been discussed with the patient and family. After consideration of risks, benefits and other options for treatment, the patient has consented to  Procedure(s): TOTAL HIP ARTHROPLASTY ANTERIOR APPROACH (Right) as a surgical intervention.  The patient's history has been reviewed, patient examined, no change in status, stable for surgery.  I have reviewed the patient's chart and labs.  Questions were answered to the patient's satisfaction.    The risks, benefits, and alternatives were discussed with the patient. There are risks associated with the surgery including, but not limited to, problems with anesthesia (death), infection, instability (giving out of the joint), dislocation, differences in leg length/angulation/rotation, fracture of bones, loosening or failure of implants, hematoma (blood accumulation) which may require surgical drainage, blood clots, pulmonary embolism, nerve injury (foot drop and lateral thigh numbness), and blood vessel injury. The patient understands these risks and elects to proceed.    Iline Oven Eva Griffo

## 2020-06-29 NOTE — Discharge Instructions (Signed)
Dr. Rod Can Joint Replacement Specialist [facility] [address] [place], [place] [address] [phone]   TOTAL HIP REPLACEMENT POSTOPERATIVE DIRECTIONS    Hip Rehabilitation, Guidelines Following Surgery   WEIGHT BEARING Weight bearing as tolerated with assist device (walker, cane, etc) as directed, use it as long as suggested by your surgeon or therapist, typically at least 4-6 weeks.  The results of a hip operation are greatly improved after range of motion and muscle strengthening exercises. Follow all safety measures which are given to protect your hip. If any of these exercises cause increased pain or swelling in your joint, decrease the amount until you are comfortable again. Then slowly increase the exercises. Call your caregiver if you have problems or questions.   HOME CARE INSTRUCTIONS  Most of the following instructions are designed to prevent the dislocation of your new hip.  Remove items at home which could result in a fall. This includes throw rugs or furniture in walking pathways.  Continue medications as instructed at time of discharge.  You may have some home medications which will be placed on hold until you complete the course of blood thinner medication.  You may start showering once you are discharged home. Do not remove your dressing. Do not put on socks or shoes without following the instructions of your caregivers.   Sit on chairs with arms. Use the chair arms to help push yourself up when arising.  Arrange for the use of a toilet seat elevator so you are not sitting low.   Walk with walker as instructed.  You may resume a sexual relationship in one month or when given the OK by your caregiver.  Use walker as long as suggested by your caregivers.  You may put full weight on your legs and walk as much as is comfortable. Avoid periods of inactivity such as sitting longer than an hour when not asleep. This helps prevent  blood clots.  You may return to work once you are cleared by Engineer, production.  Do not drive a car for 6 weeks or until released by your surgeon.  Do not drive while taking narcotics.  Wear elastic stockings for two weeks following surgery during the day but you may remove then at night.  Make sure you keep all of your appointments after your operation with all of your doctors and caregivers. You should call the office at the above phone number and make an appointment for approximately two weeks after the date of your surgery. Please pick up a stool softener and laxative for home use as long as you are requiring pain medications.  ICE to the affected hip every three hours for 30 minutes at a time and then as needed for pain and swelling. Continue to use ice on the hip for pain and swelling from surgery. You may notice swelling that will progress down to the foot and ankle.  This is normal after surgery.  Elevate the leg when you are not up walking on it.   It is important for you to complete the blood thinner medication as prescribed by your doctor.  Continue to use the breathing machine which will help keep your temperature down.  It is common for your temperature to cycle up and down following surgery, especially at night when you are not up moving around and exerting yourself.  The breathing machine keeps your lungs expanded and your temperature down.  RANGE OF MOTION AND STRENGTHENING EXERCISES  These exercises  are designed to help you keep full movement of your hip joint. Follow your caregiver's or physical therapist's instructions. Perform all exercises about fifteen times, three times per day or as directed. Exercise both hips, even if you have had only one joint replacement. These exercises can be done on a training (exercise) mat, on the floor, on a table or on a bed. Use whatever works the best and is most comfortable for you. Use music or television while you are exercising so that the exercises  are a pleasant break in your day. This will make your life better with the exercises acting as a break in routine you can look forward to.  Lying on your back, slowly slide your foot toward your buttocks, raising your knee up off the floor. Then slowly slide your foot back down until your leg is straight again.  Lying on your back spread your legs as far apart as you can without causing discomfort.  Lying on your side, raise your upper leg and foot straight up from the floor as far as is comfortable. Slowly lower the leg and repeat.  Lying on your back, tighten up the muscle in the front of your thigh (quadriceps muscles). You can do this by keeping your leg straight and trying to raise your heel off the floor. This helps strengthen the largest muscle supporting your knee.  Lying on your back, tighten up the muscles of your buttocks both with the legs straight and with the knee bent at a comfortable angle while keeping your heel on the floor.   SKILLED REHAB INSTRUCTIONS: If the patient is transferred to a skilled rehab facility following release from the hospital, a list of the current medications will be sent to the facility for the patient to continue.  When discharged from the skilled rehab facility, please have the facility set up the patient's Home Health Physical Therapy prior to being released. Also, the skilled facility will be responsible for providing the patient with their medications at time of release from the facility to include their pain medication and their blood thinner medication. If the patient is still at the rehab facility at time of the two week follow up appointment, the skilled rehab facility will also need to assist the patient in arranging follow up appointment in our office and any transportation needs.  Dental Antibiotics:  In most cases prophylactic antibiotics for Dental procdeures after total joint surgery are not necessary.  Exceptions are as follows:  1. History of  prior total joint infection  2. Severely immunocompromised (Organ Transplant, cancer chemotherapy, Rheumatoid biologic meds such as Humera)  3. Poorly controlled diabetes (A1C &gt; 8.0, blood glucose over 200)  If you have one of these conditions, contact your surgeon for an antibiotic prescription, prior to your dental procedure.   MAKE SURE YOU:  Understand these instructions.  Will watch your condition.  Will get help right away if you are not doing well or get worse.  Pick up stool softner and laxative for home use following surgery while on pain medications. Do not remove your dressing. The dressing is waterproof--it is OK to take showers. Continue to use ice for pain and swelling after surgery. Do not use any lotions or creams on the incision until instructed by your surgeon. Total Hip Protocol.

## 2020-06-29 NOTE — Anesthesia Postprocedure Evaluation (Signed)
Anesthesia Post Note  Patient: Heather Gray  Procedure(s) Performed: TOTAL HIP ARTHROPLASTY ANTERIOR APPROACH (Right Hip)     Patient location during evaluation: PACU Anesthesia Type: Spinal Level of consciousness: oriented and awake and alert Pain management: pain level controlled Vital Signs Assessment: post-procedure vital signs reviewed and stable Respiratory status: spontaneous breathing, respiratory function stable and patient connected to nasal cannula oxygen Cardiovascular status: blood pressure returned to baseline and stable Postop Assessment: no headache, no backache and no apparent nausea or vomiting Anesthetic complications: no   No complications documented.  Last Vitals:  Vitals:   06/29/20 1200 06/29/20 1215  BP: (!) 95/59 (!) 95/58  Pulse: 60 (!) 57  Resp: 17 10  Temp: (!) 35.6 C   SpO2: 100% 100%    Last Pain:  Vitals:   06/29/20 1215  TempSrc:   PainSc: 0-No pain                 Sonyia Muro S

## 2020-06-29 NOTE — Anesthesia Procedure Notes (Signed)
Spinal  Patient location during procedure: OR Start time: 06/29/2020 8:33 AM End time: 06/29/2020 8:38 AM Staffing Performed: resident/CRNA  Resident/CRNA: Garth Bigness, CRNA Preanesthetic Checklist Completed: patient identified, IV checked, site marked, risks and benefits discussed, surgical consent, monitors and equipment checked, pre-op evaluation and timeout performed Spinal Block Patient position: sitting Prep: DuraPrep Patient monitoring: heart rate, cardiac monitor, continuous pulse ox and blood pressure Approach: midline Location: L3-4 Injection technique: single-shot Needle Needle type: Sprotte and Pencan  Needle gauge: 24 G Needle length: 9 cm Needle insertion depth: 4 cm Assessment Sensory level: T4

## 2020-06-29 NOTE — Op Note (Signed)
OPERATIVE REPORT  SURGEON: Rod Can, MD   ASSISTANT: Cherlynn June, PA-C.  PREOPERATIVE DIAGNOSIS: Right hip arthritis.   POSTOPERATIVE DIAGNOSIS: Right hip arthritis.   PROCEDURE: Right total hip arthroplasty, anterior approach.   IMPLANTS: DePuy Tri Lock stem, size 5, hi offset. DePuy Pinnacle Cup, size 56 mm. DePuy Altrx liner, size 36 by 56 mm, neutral. DePuy Biolox ceramic head ball, size 36 + 5 mm. 6.5 mm cancellous bone screw x1.  ANESTHESIA:  MAC and Spinal  ESTIMATED BLOOD LOSS: 350 mL.  ANTIBIOTICS: 2g Ancef.  DRAINS: None.  COMPLICATIONS: None.   CONDITION: PACU - hemodynamically stable.   BRIEF CLINICAL NOTE: Heather Gray is a 69 y.o. female with a long-standing history of Right hip arthritis. After failing conservative management, the patient was indicated for total hip arthroplasty. The risks, benefits, and alternatives to the procedure were explained, and the patient elected to proceed.  PROCEDURE IN DETAIL: Surgical site was marked by myself in the pre-op holding area. Once inside the operating room, spinal anesthesia was obtained, and a foley catheter was inserted. The patient was then positioned on the Hana table.  All bony prominences were well padded.  The hip was prepped and draped in the normal sterile surgical fashion.  A time-out was called verifying side and site of surgery. The patient received IV antibiotics within 60 minutes of beginning the procedure.   The direct anterior approach to the hip was performed through the Hueter interval.  Lateral femoral circumflex vessels were treated with the Auqumantys. The anterior capsule was exposed and an inverted T capsulotomy was made. The femoral neck cut was made to the level of the templated cut.  A corkscrew was placed into the head and the head was removed.  The femoral head was found to have eburnated bone. The head was passed to the back table and was measured.   Acetabular exposure was achieved,  and the pulvinar and labrum were excised. Sequential reaming of the acetabulum was then performed up to a size 55 mm reamer. A 56 mm cup was then opened and impacted into place at approximately 40 degrees of abduction and 20 degrees of anteversion. I elected to augment the already acceptable press fit fixation with a single 6.5 mm cancellous bone screw. The final polyethylene liner was impacted into place and acetabular osteophytes were removed.    I then gained femoral exposure taking care to protect the abductors and greater trochanter.  This was performed using standard external rotation, extension, and adduction.  The capsule was peeled off the inner aspect of the greater trochanter, taking care to preserve the short external rotators. A cookie cutter was used to enter the femoral canal, and then the femoral canal finder was placed.  Sequential broaching was performed up to a size 5.  Calcar planer was used on the femoral neck remnant.  I placed a hi offset neck and a trial head ball.  The hip was reduced.  Leg lengths and offset were checked fluoroscopically.  The hip was dislocated and trial components were removed.  The final implants were placed, and the hip was reduced.  Fluoroscopy was used to confirm component position and leg lengths.  At 90 degrees of external rotation and full extension, the hip was stable to an anterior directed force.   The wound was copiously irrigated with Irrisept solution and normal saline using pule lavage.  Marcaine solution was injected into the periarticular soft tissue.  The wound was closed in layers using #  1 Stratafix for the fascia, 2-0 Vicryl for the subcutaneous fat, 2-0 Monocryl for the deep dermal layer, 3-0 running Monocryl subcuticular stitch, and Dermabond for the skin.  Once the glue was fully dried, an Aquacell Ag dressing was applied.  The patient was transported to the recovery room in stable condition.  Sponge, needle, and instrument counts were correct at  the end of the case x2.  The patient tolerated the procedure well and there were no known complications.  Please note that a surgical assistant was a medical necessity for this procedure to perform it in a safe and expeditious manner. Assistant was necessary to provide appropriate retraction of vital neurovascular structures, to prevent femoral fracture, and to allow for anatomic placement of the prosthesis.

## 2020-06-29 NOTE — Transfer of Care (Signed)
Immediate Anesthesia Transfer of Care Note  Patient: Heather Gray  Procedure(s) Performed: TOTAL HIP ARTHROPLASTY ANTERIOR APPROACH (Right Hip)  Patient Location: PACU  Anesthesia Type:Spinal  Level of Consciousness: awake, alert , oriented and patient cooperative  Airway & Oxygen Therapy: Patient Spontanous Breathing and Patient connected to face mask oxygen  Post-op Assessment: Report given to RN and Post -op Vital signs reviewed and stable  Post vital signs: Reviewed and stable  Last Vitals:  Vitals Value Taken Time  BP    Temp    Pulse    Resp    SpO2      Last Pain:  Vitals:   06/29/20 0630  TempSrc: Oral  PainSc: 5          Complications: No complications documented.

## 2020-06-29 NOTE — Evaluation (Signed)
Physical Therapy Evaluation Patient Details Name: Heather Gray MRN: [id number] DOB: Jun 25, 1951 Today's Date: 06/29/2020   History of Present Illness  Patient is 69 y.o. female s/p Rt THA anterior approach on 06/29/20 with PMH significant for thyroid disease, HTN, Depression, hyperthyroidism, OA, bradycardia, anxiety.     Clinical Impression  Heather Gray is a 69 y.o. feamel POD 0 s/p Rt THA. Patient reports independence with mobility at baseline. Patient is now limited by functional impairments (see PT problem list below) and requires min guard/supervision for transfers and gait with RW. Patient was able to ambulate ~180 feet with RW and supervision and cues for safe walker management. Patient instructed in exercises to facilitate ROM and circulation. Patient will benefit from continued skilled PT interventions to address impairments and progress towards PLOF. Patient has met mobility goals at adequate level for discharge home; will continue to follow if pt continues acute stay to progress towards Mod I goals.     Follow Up Recommendations Follow surgeon's recommendation for DC plan and follow-up therapies    Equipment Recommendations  None recommended by PT    Recommendations for Other Services       Precautions / Restrictions Precautions Precautions: Fall Restrictions Weight Bearing Restrictions: No Other Position/Activity Restrictions: WBAT      Mobility  Bed Mobility Overal bed mobility: Needs Assistance Bed Mobility: Supine to Sit     Supine to sit: Supervision     General bed mobility comments: no assist required for supine>sit EOB, pt able to sit up from flat bed.    Transfers Overall transfer level: Needs assistance Equipment used: Rolling walker (2 wheeled) Transfers: Sit to/from Stand Sit to Stand: Supervision;Min guard         General transfer comment: no assist required, min guard initially for rise from EOB. supervision to rise from toilet.    Ambulation/Gait Ambulation/Gait assistance: Min guard;Supervision Gait Distance (Feet): 180 Feet Assistive device: Rolling walker (2 wheeled) Gait Pattern/deviations: Step-to pattern Gait velocity: decr   General Gait Details: cues for safe step pattern and proximity to RW. no overt LOB noted and pt maintained safe postion of walker. no signs of antalgia.  Stairs            Wheelchair Mobility    Modified Rankin (Stroke Patients Only)       Balance Overall balance assessment: Mild deficits observed, not formally tested                                           Pertinent Vitals/Pain Pain Assessment: Faces Faces Pain Scale: Hurts a little bit Pain Location: Rt hip Pain Descriptors / Indicators: Aching;Discomfort Pain Intervention(s): Limited activity within patient's tolerance;Monitored during session;Repositioned    Home Living Family/patient expects to be discharged to:: Private residence Living Arrangements: Alone Available Help at Discharge: Friend(s) Stanton Kidney) Type of Home: House Home Access: Level entry     Home Layout: One level Home Equipment: Environmental consultant - 2 wheels;Cane - single point      Prior Function Level of Independence: Independent               Hand Dominance   Dominant Hand: Right    Extremity/Trunk Assessment   Upper Extremity Assessment Upper Extremity Assessment: Overall WFL for tasks assessed    Lower Extremity Assessment Lower Extremity Assessment: Overall WFL for tasks assessed    Cervical / Trunk Assessment  Cervical / Trunk Assessment: Normal  Communication   Communication: No difficulties  Cognition Arousal/Alertness: Awake/alert Behavior During Therapy: WFL for tasks assessed/performed Overall Cognitive Status: Within Functional Limits for tasks assessed                                        General Comments      Exercises Total Joint Exercises Ankle Circles/Pumps: AROM;Both;20  reps;Seated Quad Sets: AROM;Right;5 reps;Seated Short Arc Quad: AROM;Right;5 reps;Seated Heel Slides: AROM;Right;5 reps;Seated;AAROM Hip ABduction/ADduction: AROM;Right;5 reps;Seated Long Arc Quad: AROM;Right;5 reps;Seated   Assessment/Plan    PT Assessment Patient needs continued PT services  PT Problem List Decreased strength;Decreased range of motion;Decreased activity tolerance;Decreased balance;Decreased mobility;Decreased knowledge of precautions       PT Treatment Interventions DME instruction;Gait training;Stair training;Functional mobility training;Therapeutic activities;Therapeutic exercise;Balance training;Patient/family education    PT Goals (Current goals can be found in the Care Plan section)  Acute Rehab PT Goals Patient Stated Goal: get home and get back to her activities: walking, gardening PT Goal Formulation: With patient Time For Goal Achievement: 07/06/20 Potential to Achieve Goals: Good    Frequency 7X/week   Barriers to discharge        Co-evaluation               AM-PAC PT "6 Clicks" Mobility  Outcome Measure Help needed turning from your back to your side while in a flat bed without using bedrails?: None Help needed moving from lying on your back to sitting on the side of a flat bed without using bedrails?: None Help needed moving to and from a bed to a chair (including a wheelchair)?: A Little Help needed standing up from a chair using your arms (e.g., wheelchair or bedside chair)?: A Little Help needed to walk in hospital room?: A Little Help needed climbing 3-5 steps with a railing? : A Little 6 Click Score: 20    End of Session Equipment Utilized During Treatment: Gait belt Activity Tolerance: Patient tolerated treatment well Patient left: in chair;with call bell/phone within reach Nurse Communication: Mobility status PT Visit Diagnosis: Muscle weakness (generalized) (M62.81);Difficulty in walking, not elsewhere classified (R26.2)     Time: 4742-5956 PT Time Calculation (min) (ACUTE ONLY): 30 min   Charges:   PT Evaluation $PT Eval Low Complexity: 1 Low PT Treatments $Gait Training: 8-22 mins        Verner Mould, DPT Acute Rehabilitation Services  Office [phone] Pager [phone]  06/29/2020 4:49 PM

## 2020-06-30 ENCOUNTER — Encounter (HOSPITAL_COMMUNITY): Payer: Self-pay | Admitting: Orthopedic Surgery

## 2020-12-20 NOTE — Progress Notes (Signed)
Date:  12/23/2020   ID:  Heather Gray, DOB [birth year], MRN [id number]  PCP:  Heywood Bene, PA-C  Cardiologist:  Rex Kras, DO, [facility] (established care 02/10/2020)  Date: 12/23/20 Last Office Visit: 06/23/2020  Chief Complaint  Patient presents with  . Bradycardia  . Follow-up    Post op 6 month    HPI  Heather Gray is a 70 y.o. female who presents to the office with a chief complaint of " 23-monthfollow-up for bradycardia." Patient's past medical history and cardiac risk factors include: Benign essential hypertension, former smoker, thyroid disease, anxiety, depression, postmenopausal female, advanced age.  Patient was referred to the office back in June 2021 for evaluation of bradycardia and preoperative risk stratification.  Patient is status post right hip replacement.  She states that the surgery went very well-she is recuperating as expected.  Since last office visit patient states that she is doing well from a cardiovascular standpoint.  She does not have any symptoms suggestive of symptomatic bradycardia after the cessation of Toprol-XL.  Patient denies any chest pain or anginal equivalent.  No recent hospitalizations or urgent care visits for cardiovascular symptoms.  FUNCTIONAL STATUS: She also enjoys doing yardwork. Stationary bike 10-169m three times day.  ALLERGIES: Allergies  Allergen Reactions  . Tetracyclines & Related Shortness Of Breath and Palpitations  . Sulfa Antibiotics     Leg cramping    MEDICATION LIST PRIOR TO VISIT: Current Meds  Medication Sig  . alendronate (FOSAMAX) 70 MG tablet Take 70 mg by mouth once a week. Take with a full glass of water on an empty stomach.  . ALPRAZolam (XANAX) 0.5 MG tablet Take 1.5 mg by mouth at bedtime.  . Marland KitchenmLODipine (NORVASC) 5 MG tablet Take 5 mg by mouth daily.  . Calcium-Magnesium-Vitamin D (CALCIUM 1200+D3 PO) Take 1 tablet by mouth daily.  . hydrochlorothiazide (HYDRODIURIL) 25 MG tablet Take 25 mg by mouth  daily.  . Marland Kitchenbuprofen (ADVIL) 800 MG tablet Take 800 mg by mouth as needed.  . lamoTRIgine (LAMICTAL) 100 MG tablet Take 100 mg by mouth daily.  . Marland Kitchenosartan (COZAAR) 100 MG tablet Take 100 mg by mouth every morning.  . magnesium gluconate (MAGONATE) 500 MG tablet Take 500 mg by mouth daily.  . ondansetron (ZOFRAN) 4 MG tablet Take 1 tablet (4 mg total) by mouth every 8 (eight) hours as needed for nausea or vomiting.  . Marland KitchenUEtiapine (SEROQUEL) 100 MG tablet Take 100 mg by mouth at bedtime.   . rizatriptan (MAXALT) 10 MG tablet Take 10 mg by mouth as needed for migraine. May repeat in 2 hours if needed  . topiramate (TOPAMAX) 200 MG tablet Take 200 mg by mouth daily.   . vitamin B-12 (CYANOCOBALAMIN) 500 MCG tablet Take 500 mcg by mouth daily.      PAST MEDICAL HISTORY: Past Medical History:  Diagnosis Date  . Anxiety   . Arthritis   . Bradycardia   . Depression   . Hypertension   . Hyperthyroidism   . Migraine   . Sleep apnea    No CPAP  . Thyroid disease    hyperthyroid    PAST SURGICAL HISTORY: Past Surgical History:  Procedure Laterality Date  . ABDOMINAL HYSTERECTOMY    . ABLATION SAPHENOUS VEIN W/ RFA Right   . BLADDER SURGERY     Bladder Tack  . BUNIONECTOMY Bilateral   . HAMMER TOE SURGERY Bilateral   . JOINT REPLACEMENT Left 2008  . ROTATOR CUFF REPAIR  Right   . 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   Procedure: TOTAL HIP ARTHROPLASTY ANTERIOR APPROACH;  Surgeon: Rod Can, MD;  Location: [facility] ORS;  Service: Orthopedics;  Laterality: Right;    FAMILY HISTORY: The patient family history includes Alcohol abuse in her father, mother, and sister; Schizophrenia in her sister.  SOCIAL HISTORY:  The patient  reports that she quit smoking about 32 years ago. Her smoking use included cigarettes. She has a 60.00 pack-year smoking history. She has never used smokeless tobacco. She reports current alcohol use. She reports current drug use. Drug: Marijuana.  REVIEW OF  SYSTEMS: Review of Systems  Constitutional: Negative for chills, fever and malaise/fatigue.  HENT: Negative for hoarse voice and nosebleeds.   Eyes: Negative for discharge, double vision and pain.  Cardiovascular: Negative for chest pain, claudication, dyspnea on exertion, leg swelling, near-syncope, orthopnea, palpitations, paroxysmal nocturnal dyspnea and syncope.  Respiratory: Negative for hemoptysis and shortness of breath.   Musculoskeletal: Negative for joint pain, muscle cramps and myalgias.  Gastrointestinal: Negative for abdominal pain, constipation, diarrhea, hematemesis, hematochezia, melena, nausea and vomiting.  Neurological: Negative for dizziness and light-headedness.    PHYSICAL EXAM: Vitals with BMI 12/23/2020 12/23/2020 06/29/2020  Height - $Remove'5\' 4"'AXQBdFv$  -  Weight - 164 lbs -  BMI - 06.30 -  Systolic 160 109 323  Diastolic 80 91 74  Pulse 68 79 59  Some encounter information is confidential and restricted. Go to Review Flowsheets activity to see all data.   CONSTITUTIONAL: Well-developed and well-nourished. No acute distress.  SKIN: Skin is warm and dry. No rash noted. No cyanosis. No pallor. No jaundice HEAD: Normocephalic and atraumatic.  EYES: No scleral icterus MOUTH/THROAT: Moist oral membranes.  NECK: No JVD present. No thyromegaly noted. No carotid bruits  LYMPHATIC: No visible cervical adenopathy.  CHEST Normal respiratory effort. No intercostal retractions  LUNGS: Clear to auscultation bilaterally.  No stridor. No wheezes. No rales.  CARDIOVASCULAR: Regular, positive S1-S2, no murmurs rubs or gallops appreciated. ABDOMINAL: soft, nontender, nondistended, positive bowel sounds all 4 quadrants. No apparent ascites.  EXTREMITIES: No peripheral edema  HEMATOLOGIC: No significant bruising NEUROLOGIC: Oriented to person, place, and time. Nonfocal. Normal muscle tone.  PSYCHIATRIC: Normal mood and affect. Normal behavior. Cooperative  CARDIAC  DATABASE: EKG: 06/23/2020: Sinus bradycardia, 55 bpm, left axis deviation,, left anterior fascicular block, poor R wave progression, consider old anterior infarct, without underlying injury pattern.  Lead V4 is not interpretable.  Echocardiogram: 02/18/2020:  Normal LV systolic function with visual EF 55-60%. Left ventricle cavity is normal in size. Normal global wall motion. Doppler evidence of grade II (pseudonormal) diastolic dysfunction, normal LAP. Calculated EF 55%.  Left atrial cavity is severely dilated.  Mild (Grade I) aortic regurgitation.  Mild (Grade I) mitral regurgitation.  Mild tricuspid regurgitation. No evidence of pulmonary hypertension.  No prior study for comparison.   Stress Testing: Lexiscan Tetrofosmin stress test 03/07/2020:  Lexiscan nuclear stress test performed using 1-day protocol.  Normal myocardial perfusion. Stress LVEF 63%.  Low risk study,  Heart Catheterization: None   3 day extended Holter monitor: Dominant rhythm normal sinus rhythm. Heart rate 34-113 bpm.  Average heart rate 57 bpm. No atrial fibrillation/atrial flutter/supraventricular tachycardia/ventricular tachycardia/high grade AV block, sinus pause greater than or equal to 3 seconds in duration. Total ventricular ectopic burden <1.0%. Total supraventricular ectopic burden <1.0%. 1 episode reported as supraventricular tachycardia at 4:57 AM; however, reviewed rhythm strip notes sinus mechanism with blocked premature atrial contraction. Number of patient triggered  events: 0.    LABORATORY DATA: External Labs: Collected: Jan 14, 2020 at [facility] Creatinine 1.0 mg/dL. eGFR: 58 mL/min per 1.73 m at bedtime Hemoglobin A1c: 5.0  10/27/2019: [facility] TSH: 2.57   IMPRESSION:    ICD-10-CM   1. Bradycardia  R00.1 CANCELED: EKG 12-Lead  2. Benign hypertension  I10   3. Former smoker  Z87.891      RECOMMENDATIONS: DEYSHA CARTIER is a 70 y.o.  female whose past medical history and cardiac risk factors include: Benign essential hypertension, former smoker, thyroid disease, anxiety, depression, postmenopausal female, advanced age.  Since last office visit patient has undergone a right hip replacement without any complications and has recovered very well.  She no longer has symptoms of symptomatic bradycardia after the cessation of Toprol-XL.  She has undergone extensive cardiovascular work-up as outlined above and no additional testing is needed at this time.  Medications reconciled.  Recommend yearly follow-up or as needed.  No changes were made during this encounter.  FINAL MEDICATION LIST END OF ENCOUNTER: No orders of the defined types were placed in this encounter.   Current Outpatient Medications:  .  alendronate (FOSAMAX) 70 MG tablet, Take 70 mg by mouth once a week. Take with a full glass of water on an empty stomach., Disp: , Rfl:  .  ALPRAZolam (XANAX) 0.5 MG tablet, Take 1.5 mg by mouth at bedtime., Disp: , Rfl:  .  amLODipine (NORVASC) 5 MG tablet, Take 5 mg by mouth daily., Disp: , Rfl:  .  Calcium-Magnesium-Vitamin D (CALCIUM 1200+D3 PO), Take 1 tablet by mouth daily., Disp: , Rfl:  .  hydrochlorothiazide (HYDRODIURIL) 25 MG tablet, Take 25 mg by mouth daily., Disp: , Rfl:  .  ibuprofen (ADVIL) 800 MG tablet, Take 800 mg by mouth as needed., Disp: , Rfl:  .  lamoTRIgine (LAMICTAL) 100 MG tablet, Take 100 mg by mouth daily., Disp: , Rfl:  .  losartan (COZAAR) 100 MG tablet, Take 100 mg by mouth every morning., Disp: , Rfl:  .  magnesium gluconate (MAGONATE) 500 MG tablet, Take 500 mg by mouth daily., Disp: , Rfl:  .  ondansetron (ZOFRAN) 4 MG tablet, Take 1 tablet (4 mg total) by mouth every 8 (eight) hours as needed for nausea or vomiting., Disp: 20 tablet, Rfl: 0 .  QUEtiapine (SEROQUEL) 100 MG tablet, Take 100 mg by mouth at bedtime. , Disp: , Rfl:  .  rizatriptan (MAXALT) 10 MG tablet, Take 10 mg by mouth as  needed for migraine. May repeat in 2 hours if needed, Disp: , Rfl:  .  topiramate (TOPAMAX) 200 MG tablet, Take 200 mg by mouth daily. , Disp: , Rfl:  .  vitamin B-12 (CYANOCOBALAMIN) 500 MCG tablet, Take 500 mcg by mouth daily. , Disp: , Rfl:  .  meloxicam (MOBIC) 7.5 MG tablet, daily at 6 (six) AM., Disp: , Rfl:   No orders of the defined types were placed in this encounter.   There are no Patient Instructions on file for this visit.  --Continue cardiac medications as reconciled in final medication list. --Return in about 1 year (around 12/23/2021), or if symptoms worsen or fail to improve, for Follow up hx of bradycardia. Or sooner if needed. --Continue follow-up with your primary care physician regarding the management of your other chronic comorbid conditions.  Patient's questions and concerns were addressed to her satisfaction. She voices understanding of the instructions provided during this encounter.  This note was created using a voice recognition software as a result there may be grammatical errors inadvertently enclosed that do not reflect the nature of this encounter. Every attempt is made to correct such errors.  Total time spent: 21 minutes.  Rex Kras, [place], [facility]  Pager: [phone] Office: [phone]

## 2020-12-23 ENCOUNTER — Other Ambulatory Visit: Payer: Self-pay

## 2020-12-23 ENCOUNTER — Encounter: Payer: Self-pay | Admitting: Cardiology

## 2020-12-23 ENCOUNTER — Ambulatory Visit: Payer: Medicare HMO | Admitting: Cardiology

## 2020-12-23 VITALS — BP 117/80 | HR 68 | Temp 97.2°F | Resp 16 | Ht 64.0 in | Wt 164.0 lb

## 2020-12-23 DIAGNOSIS — R001 Bradycardia, unspecified: Secondary | ICD-10-CM

## 2020-12-23 DIAGNOSIS — I10 Essential (primary) hypertension: Secondary | ICD-10-CM

## 2020-12-23 DIAGNOSIS — Z87891 Personal history of nicotine dependence: Secondary | ICD-10-CM

## 2020-12-23 DIAGNOSIS — Z01810 Encounter for preprocedural cardiovascular examination: Secondary | ICD-10-CM

## 2021-08-08 ENCOUNTER — Ambulatory Visit (INDEPENDENT_AMBULATORY_CARE_PROVIDER_SITE_OTHER): Payer: Medicare HMO | Admitting: Pulmonary Disease

## 2021-08-08 ENCOUNTER — Other Ambulatory Visit: Payer: Self-pay

## 2021-08-08 ENCOUNTER — Encounter: Payer: Self-pay | Admitting: Pulmonary Disease

## 2021-08-08 VITALS — BP 118/78 | HR 59 | Temp 98.1°F | Ht 65.0 in | Wt 160.0 lb

## 2021-08-08 DIAGNOSIS — G4700 Insomnia, unspecified: Secondary | ICD-10-CM | POA: Diagnosis not present

## 2021-08-08 DIAGNOSIS — F3130 Bipolar disorder, current episode depressed, mild or moderate severity, unspecified: Secondary | ICD-10-CM | POA: Diagnosis not present

## 2021-08-08 MED ORDER — ESZOPICLONE 2 MG PO TABS: 2.0000 mg | ORAL_TABLET | Freq: Every evening | ORAL | 2 refills | Status: DC | PRN

## 2021-08-08 NOTE — Progress Notes (Signed)
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  HPI:  Longstanding history of insomnia over 10 years  Does not snore  Usually able to fall asleep easily but wakes up every couple of hours Usually goes to bed between 8 and 11 PM, takes about 30 minutes to fall asleep 3-4 awakenings Final wake up time about 6 AM  Reformed smoker quit over 30 years ago  Recently quit using caffeinated beverages  Has tried multiple medications in the past that have not helped including Ambien, Seroquel, trazodone, alprazolam  She does have a history of depression/anxiety  Outpatient Encounter Medications as of 08/08/2021  Medication Sig   alendronate (FOSAMAX) 70 MG tablet Take 70 mg by mouth once a week. Take with a full glass of water on an empty stomach.   ALPRAZolam (XANAX) 0.5 MG tablet Take 1.5 mg by mouth at bedtime.   amLODipine (NORVASC) 5 MG tablet Take 5 mg by mouth daily.   Calcium-Magnesium-Vitamin D (CALCIUM 1200+D3 PO) Take 1 tablet by mouth daily.   hydrochlorothiazide (HYDRODIURIL) 25 MG tablet Take 25 mg by mouth daily.   ibuprofen (ADVIL) 800 MG tablet Take 800 mg by mouth as needed.   LAMICTAL 200 MG tablet Take 200 mg by mouth every morning.   losartan (COZAAR) 100 MG tablet Take 100 mg by mouth every morning.   magnesium gluconate (MAGONATE) 500 MG tablet Take 500 mg by mouth daily.   meloxicam (MOBIC) 7.5 MG tablet daily at 6 (six) AM.   QUEtiapine (SEROQUEL) 100 MG tablet Take 100 mg by mouth at bedtime.    rizatriptan (MAXALT) 10 MG tablet Take 10 mg by mouth as needed for migraine. May repeat in 2 hours if needed   topiramate (TOPAMAX) 200 MG tablet Take 200 mg by mouth daily.    vitamin B-12 (CYANOCOBALAMIN) 500 MCG tablet Take 500 mcg by mouth  daily.    [DISCONTINUED] losartan (COZAAR) 100 MG tablet Take 1 tablet by mouth daily.   [DISCONTINUED] lamoTRIgine (LAMICTAL) 100 MG tablet Take 100 mg by mouth daily. (Patient not taking: Reported on 08/08/2021)   [DISCONTINUED] ondansetron (ZOFRAN) 4 MG tablet Take 1 tablet (4 mg total) by mouth every 8 (eight) hours as needed for nausea or vomiting.   No facility-administered encounter medications on file as of 08/08/2021.    Allergies as of 08/08/2021 - Review Complete 08/08/2021  Allergen Reaction Noted   Tetracyclines & related Shortness Of Breath and Palpitations 10/16/2013   Sulfa antibiotics Other (See Comments) 02/10/2020    Past Medical History:  Diagnosis Date   Anxiety    Arthritis    Bradycardia    Depression    Hypertension    Hyperthyroidism    Migraine    Sleep apnea    No CPAP   Thyroid disease    hyperthyroid    Past Surgical History:  Procedure Laterality Date   ABDOMINAL HYSTERECTOMY     ABLATION SAPHENOUS VEIN W/ RFA Right    BLADDER SURGERY     Bladder Tack   BUNIONECTOMY Bilateral    HAMMER TOE SURGERY Bilateral    JOINT REPLACEMENT Left 2008   ROTATOR CUFF REPAIR Right    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   Procedure: TOTAL  HIP ARTHROPLASTY ANTERIOR APPROACH;  Surgeon: Samson Frederic, MD;  Location: [facility];  Service: Orthopedics;  Laterality: Right;    Family History  Problem Relation Age of Onset   Alcohol abuse Mother    Alcohol abuse Father    Schizophrenia Sister    Alcohol abuse Sister     Social History   Socioeconomic History   Marital status: Divorced    Spouse name: Not on file   Number of children: 2   Years of education: Not on file   Highest education level: Not on file  Occupational History   Not on file  Tobacco Use   Smoking status: Former    Packs/day: 2.00    Years: 30.00    Pack years: 60.00    Types: Cigarettes    Quit date: 10/16/1988    Years since quitting: 32.8   Smokeless tobacco: Never   Vaping Use   Vaping Use: Never used  Substance and Sexual Activity   Alcohol use: Yes    Alcohol/week: 0.0 standard drinks    Comment: Ocassionally "every six months or so"   Drug use: Yes    Types: Marijuana    Comment: Last use 3 weeks ago   Sexual activity: Never    Birth control/protection: Surgical, Post-menopausal    Comment: Hysterectomy  Other Topics Concern   Not on file  Social History Narrative   Not on file   Social Determinants of Health   Financial Resource Strain: Not on file  Food Insecurity: Not on file  Transportation Needs: Not on file  Physical Activity: Not on file  Stress: Not on file  Social Connections: Not on file  Intimate Partner Violence: Not on file    Review of Systems  Constitutional:  Negative for fatigue.  Respiratory:  Negative for apnea.    There were no vitals filed for this visit.   Physical Exam Constitutional:      Appearance: Normal appearance.  HENT:     Head: Normocephalic.     Mouth/Throat:     Mouth: Mucous membranes are moist.  Cardiovascular:     Rate and Rhythm: Normal rate and regular rhythm.     Heart sounds: No murmur heard.   No friction rub.  Pulmonary:     Effort: No respiratory distress.     Breath sounds: No wheezing or rhonchi.  Musculoskeletal:     Cervical back: No rigidity or tenderness.  Neurological:     Mental Status: She is alert.  Psychiatric:        Mood and Affect: Mood normal.     Data Reviewed: No significant test results to report Recent CBC, BMP within normal limits  Referral records from [facility] reviewed Assessment:  Sleep maintenance insomnia  History of depression anxiety  -Did did not benefit from using Ambien in the past  History of hypertension  History of chronic migraines  History of hyperthyroidism  Plan/Recommendations: Behavioral modifications was discussed  Limited hours in bed about 6 to 8 hours for sleep  Trial with Lunesta 2 mg, may increase to  3 mg if needed  Another group of medications may need to be tried if she does not benefit from Lunesta  Follow-up in 4 to 6 weeks   Virl Diamond MD [facility] 08/08/2021, 3:34 PM  CC: Roger Kill, *

## 2021-08-08 NOTE — Patient Instructions (Signed)
Prescription for Heather Gray will be sent to pharmacy  Start at 2 mg, may increase to 3 mg if needed  I will see you back in about 4 to 6 weeks to see how you are doing  Discontinue medication if even the low-dose is making him feel groggy in the mornings

## 2021-08-10 ENCOUNTER — Telehealth: Payer: Self-pay | Admitting: Pulmonary Disease

## 2021-08-11 ENCOUNTER — Other Ambulatory Visit (HOSPITAL_COMMUNITY): Payer: Self-pay

## 2021-08-11 ENCOUNTER — Telehealth: Payer: Self-pay

## 2021-08-11 NOTE — Telephone Encounter (Signed)
Patient Advocate Encounter  Prior Authorization for Lunesta 2mg  tabs has been approved.    PA#:  Effective dates: 08/27/20 through 08/26/22.  Per Test Claim Patients co-pay is $7.12.   Spoke with Pharmacy to Process.  Patient Advocate Fax:  [phone]

## 2021-08-11 NOTE — Telephone Encounter (Signed)
PA team, can you guys assist with this? Thanks

## 2021-08-11 NOTE — Telephone Encounter (Signed)
Patient Advocate Encounter   Received notification from patient calls that prior authorization for Lunesta 2mg  tabs is required by his/her insurance [employer].  PA submitted on 08/11/21  [place]#: [id number]  Status is pending    [facility] will continue to follow:  Patient Advocate Fax:  [phone]

## 2021-08-15 NOTE — Telephone Encounter (Signed)
See separate encounter from prior auth team 12/16.

## 2021-09-05 ENCOUNTER — Encounter: Payer: Self-pay | Admitting: Pulmonary Disease

## 2021-09-05 ENCOUNTER — Ambulatory Visit (INDEPENDENT_AMBULATORY_CARE_PROVIDER_SITE_OTHER): Payer: Medicare HMO | Admitting: Pulmonary Disease

## 2021-09-05 ENCOUNTER — Other Ambulatory Visit: Payer: Self-pay | Admitting: Pulmonary Disease

## 2021-09-05 ENCOUNTER — Other Ambulatory Visit: Payer: Self-pay

## 2021-09-05 VITALS — BP 116/74 | HR 61 | Temp 98.3°F | Ht 65.0 in | Wt 156.0 lb

## 2021-09-05 DIAGNOSIS — G4700 Insomnia, unspecified: Secondary | ICD-10-CM | POA: Diagnosis not present

## 2021-09-05 MED ORDER — ESZOPICLONE 3 MG PO TABS: 3.0000 mg | ORAL_TABLET | Freq: Every day | ORAL | 3 refills | Status: DC

## 2021-09-05 NOTE — Progress Notes (Signed)
Heather Gray    [id number]    Nov 01, 1950  Primary Care Physician:Williams, Karis JubaBreejante J, PA-C  Referring Physician: Roger KillWilliams, Breejante J, PA-C [address] [place],  [place] [id number]  Chief complaint:   Patient is being seen for sleep maintenance insomnia  HPI:  Longstanding history of insomnia over 10 years  Does not snore  has been using Lunesta 2 mg  Sleep habits about the same Usually goes to bed between 8 and 11, about 30 minutes to fall asleep Final wake up time about 6 AM  History of depression/anxiety  Reformed smoker quit about 30 years ago  Reformed smoker quit over 30 years ago  Recently quit using caffeinated beverages  Has tried multiple medications in the past that have not helped including Ambien, Seroquel, trazodone, alprazolam  She does have a history of depression/anxiety  Outpatient Encounter Medications as of 09/05/2021  Medication Sig   alendronate (FOSAMAX) 70 MG tablet Take 70 mg by mouth once a week. Take with a full glass of water on an empty stomach.   ALPRAZolam (XANAX) 0.5 MG tablet Take 1.5 mg by mouth at bedtime.   amLODipine (NORVASC) 5 MG tablet Take 5 mg by mouth daily.   Calcium-Magnesium-Vitamin D (CALCIUM 1200+D3 PO) Take 1 tablet by mouth daily.   eszopiclone (LUNESTA) 2 MG TABS tablet Take 1 tablet (2 mg total) by mouth at bedtime as needed for sleep. Take immediately before bedtime   hydrochlorothiazide (HYDRODIURIL) 25 MG tablet Take 25 mg by mouth daily.   LAMICTAL 200 MG tablet Take 200 mg by mouth every morning.   losartan (COZAAR) 100 MG tablet Take 100 mg by mouth every morning.   QUEtiapine (SEROQUEL) 300 MG tablet Take 300 mg by mouth at bedtime.   rizatriptan (MAXALT) 10 MG tablet Take 10 mg by mouth as needed for migraine. May repeat in 2 hours if needed   topiramate (TOPAMAX) 200 MG tablet Take 200 mg by mouth daily.    [DISCONTINUED] QUEtiapine (SEROQUEL) 100 MG tablet Take 100 mg by mouth at bedtime.     [DISCONTINUED] ibuprofen (ADVIL) 800 MG tablet Take 800 mg by mouth as needed.   [DISCONTINUED] magnesium gluconate (MAGONATE) 500 MG tablet Take 500 mg by mouth daily. (Patient not taking: Reported on 09/05/2021)   [DISCONTINUED] meloxicam (MOBIC) 7.5 MG tablet daily at 6 (six) AM. (Patient not taking: Reported on 09/05/2021)   [DISCONTINUED] vitamin B-12 (CYANOCOBALAMIN) 500 MCG tablet Take 500 mcg by mouth daily.  (Patient not taking: Reported on 09/05/2021)   No facility-administered encounter medications on file as of 09/05/2021.    Allergies as of 09/05/2021 - Review Complete 09/05/2021  Allergen Reaction Noted   Tetracyclines & related Shortness Of Breath and Palpitations 10/16/2013   Sulfa antibiotics Other (See Comments) 02/10/2020    Past Medical History:  Diagnosis Date   Anxiety    Arthritis    Bradycardia    Depression    Hypertension    Hyperthyroidism    Migraine    Sleep apnea    No CPAP   Thyroid disease    hyperthyroid    Past Surgical History:  Procedure Laterality Date   ABDOMINAL HYSTERECTOMY     ABLATION SAPHENOUS VEIN W/ RFA Right    BLADDER SURGERY     Bladder Tack   BUNIONECTOMY Bilateral    HAMMER TOE SURGERY Bilateral    JOINT REPLACEMENT Left 2008   ROTATOR CUFF REPAIR Right  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   Procedure: TOTAL HIP ARTHROPLASTY ANTERIOR APPROACH;  Surgeon: Samson Frederic, MD;  Location: [facility];  Service: Orthopedics;  Laterality: Right;    Family History  Problem Relation Age of Onset   Alcohol abuse Mother    Alcohol abuse Father    Schizophrenia Sister    Alcohol abuse Sister     Social History   Socioeconomic History   Marital status: Divorced    Spouse name: Not on file   Number of children: 2   Years of education: Not on file   Highest education level: Not on file  Occupational History   Not on file  Tobacco Use   Smoking status: Former    Packs/day: 2.00    Years: 30.00    Pack years: 60.00     Types: Cigarettes    Quit date: 10/16/1988    Years since quitting: 32.9   Smokeless tobacco: Never  Vaping Use   Vaping Use: Never used  Substance and Sexual Activity   Alcohol use: Yes    Alcohol/week: 0.0 standard drinks    Comment: Ocassionally "every six months or so"   Drug use: Yes    Types: Marijuana    Comment: Last use 3 weeks ago   Sexual activity: Never    Birth control/protection: Surgical, Post-menopausal    Comment: Hysterectomy  Other Topics Concern   Not on file  Social History Narrative   Not on file   Social Determinants of Health   Financial Resource Strain: Not on file  Food Insecurity: Not on file  Transportation Needs: Not on file  Physical Activity: Not on file  Stress: Not on file  Social Connections: Not on file  Intimate Partner Violence: Not on file    Review of Systems  Constitutional:  Negative for fatigue.  Respiratory:  Negative for apnea.    Vitals:   09/05/21 1000  BP: 116/74  Pulse: 61  Temp: 98.3 F (36.8 C)  SpO2: 98%     Physical Exam Constitutional:      Appearance: Normal appearance.  HENT:     Head: Normocephalic.     Mouth/Throat:     Mouth: Mucous membranes are moist.  Cardiovascular:     Rate and Rhythm: Normal rate and regular rhythm.     Heart sounds: No murmur heard.   No friction rub.  Pulmonary:     Effort: No respiratory distress.     Breath sounds: No wheezing or rhonchi.  Musculoskeletal:     Cervical back: No rigidity or tenderness.  Neurological:     Mental Status: She is alert.  Psychiatric:        Mood and Affect: Mood normal.     Data Reviewed: No significant test results to report Recent CBC, BMP within normal limits  Referral records from [facility] reviewed Assessment:  Sleep maintenance insomnia -Lunesta does help but not perfect  History of depression anxiety -Controlled on medications  -Did did not benefit from using Ambien in the past  History of hypertension  History  of chronic migraines  History of hyperthyroidism  Plan/Recommendations: Behavioral modifications was discussed  Limited hours in bed about 6 to 8 hours for sleep  Trial with Lunesta 2 mg-we will increase to 3 mg  Another group of medications may need to be tried if she does not benefit from Lunesta  follow-up in 3 months  Virl Diamond MD [facility] 09/05/2021, 10:17 AM  CC: Mayford Knife,  Karis Juba, *

## 2021-09-05 NOTE — Patient Instructions (Signed)
Increase Lunesta to 3 mg  I will see you back in about 3 months

## 2021-11-06 ENCOUNTER — Other Ambulatory Visit: Payer: Self-pay | Admitting: Physician Assistant

## 2021-11-06 DIAGNOSIS — Z1231 Encounter for screening mammogram for malignant neoplasm of breast: Secondary | ICD-10-CM

## 2021-11-13 ENCOUNTER — Ambulatory Visit
Admission: RE | Admit: 2021-11-13 | Discharge: 2021-11-13 | Disposition: A | Discharge status: Home / Self Care | Payer: Medicare HMO | Source: Ambulatory Visit | Attending: Physician Assistant | Admitting: Physician Assistant

## 2021-11-13 DIAGNOSIS — Z1231 Encounter for screening mammogram for malignant neoplasm of breast: Secondary | ICD-10-CM

## 2021-12-04 ENCOUNTER — Ambulatory Visit: Payer: Medicare HMO | Admitting: Pulmonary Disease

## 2021-12-04 ENCOUNTER — Encounter: Payer: Self-pay | Admitting: Pulmonary Disease

## 2021-12-04 VITALS — BP 123/78 | HR 57 | Temp 97.7°F | Resp 98 | Ht 65.0 in | Wt 156.4 lb

## 2021-12-04 DIAGNOSIS — G4700 Insomnia, unspecified: Secondary | ICD-10-CM

## 2021-12-04 DIAGNOSIS — F3130 Bipolar disorder, current episode depressed, mild or moderate severity, unspecified: Secondary | ICD-10-CM

## 2021-12-04 MED ORDER — BELSOMRA 10 MG PO TABS
10.0000 mg | ORAL_TABLET | Freq: Every evening | ORAL | 3 refills | Status: DC
Start: 2021-12-04 — End: 2021-12-25

## 2021-12-04 NOTE — Patient Instructions (Signed)
Prescription for Belsomra sent to pharmacy for you at 10 mg nightly ? ?Call us if you feel you may need a higher dose as we talked about ? ?Stop taking the Lunesta ? ?Try and devote about 6 to 8 hours to sleep as able ? ?I will see you in 3 months ?

## 2021-12-04 NOTE — Progress Notes (Signed)
? ?      Heather Gray    [id number]    1951/01/24 ? ?Primary Care Physician:Williams, Gracelyn Nurse, PA-C ? ?Referring Physician: Heywood Bene, PA-C ?[address] [place],  [place] [address] ? ?Chief complaint:   ?Patient is being seen for sleep maintenance insomnia ? ?HPI: ? ?Has been using Lunesta at 3 mg, not very effective ?Still gets about 2 to 3 hours before waking up and not able to go back to sleep ? ?Longstanding history of insomnia over 10 years ? ?Does not snore  ? ?No significant changes in her health since the last visit ?Follows up with behavioral therapy ? ?Sleep habits about the same ?Usually goes to bed between 8 and 11, about 30 minutes to fall asleep ?Final wake up time about 6 AM ? ?History of depression/anxiety ?-No changes in medications ? ?Reformed smoker quit about 30 years ago ? ?Reformed smoker quit over 30 years ago ? ?Recently quit using caffeinated beverages ? ?Has tried multiple medications in the past that have not helped including Ambien, Seroquel, trazodone, alprazolam ?-She remembers using as much as 300 mg of trazodone with no good effect ? ?She does have a history of depression/anxiety ? ?Outpatient Encounter Medications as of 12/04/2021  ?Medication Sig  ? alendronate (FOSAMAX) 70 MG tablet Take 70 mg by mouth once a week. Take with a full glass of water on an empty stomach.  ? ALPRAZolam (XANAX) 0.5 MG tablet Take 1.5 mg by mouth at bedtime.  ? amLODipine (NORVASC) 5 MG tablet Take 5 mg by mouth daily.  ? Calcium-Magnesium-Vitamin D (CALCIUM 1200+D3 PO) Take 1 tablet by mouth daily.  ? Eszopiclone 3 MG TABS Take 1 tablet (3 mg total) by mouth at bedtime. Take immediately before bedtime  ? hydrochlorothiazide (HYDRODIURIL) 25 MG tablet Take 25 mg by mouth daily.  ? LAMICTAL 200 MG tablet Take 200 mg by mouth every morning.  ? losartan (COZAAR) 100 MG tablet Take 100 mg by mouth every morning.  ? QUEtiapine (SEROQUEL) 300 MG tablet Take 300 mg by mouth at bedtime.  ?  rizatriptan (MAXALT) 10 MG tablet Take 10 mg by mouth as needed for migraine. May repeat in 2 hours if needed  ? topiramate (TOPAMAX) 200 MG tablet Take 200 mg by mouth daily.   ? ?No facility-administered encounter medications on file as of 12/04/2021.  ? ? ?Allergies as of 12/04/2021 - Review Complete 12/04/2021  ?Allergen Reaction Noted  ? Tetracyclines & related Shortness Of Breath and Palpitations 10/16/2013  ? Sulfa antibiotics Other (See Comments) 02/10/2020  ? ? ?Past Medical History:  ?Diagnosis Date  ? Anxiety   ? Arthritis   ? Bradycardia   ? Depression   ? Hypertension   ? Hyperthyroidism   ? Migraine   ? Sleep apnea   ? No CPAP  ? Thyroid disease   ? hyperthyroid  ? ? ?Past Surgical History:  ?Procedure Laterality Date  ? ABDOMINAL HYSTERECTOMY    ? ABLATION SAPHENOUS VEIN W/ RFA Right   ? BLADDER SURGERY    ? Bladder Tack  ? BUNIONECTOMY Bilateral   ? HAMMER TOE SURGERY Bilateral   ? JOINT REPLACEMENT Left 2008  ? ROTATOR CUFF REPAIR Right   ? 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 Procedure: TOTAL HIP ARTHROPLASTY ANTERIOR APPROACH;  Surgeon: Rod Can, MD;  Location: [facility];  Service: Orthopedics;  Laterality: Right;  ? ? ?Family History  ?Problem Relation Age of Onset  ?  Alcohol abuse Mother   ? Alcohol abuse Father   ? Schizophrenia Sister   ? Alcohol abuse Sister   ? ? ?Social History  ? ?Socioeconomic History  ? Marital status: Divorced  ?  Spouse name: Not on file  ? Number of children: 2  ? Years of education: Not on file  ? Highest education level: Not on file  ?Occupational History  ? Not on file  ?Tobacco Use  ? Smoking status: Former  ?  Packs/day: 2.00  ?  Years: 30.00  ?  Pack years: 60.00  ?  Types: Cigarettes  ?  Quit date: 10/16/1988  ?  Years since quitting: 33.1  ? Smokeless tobacco: Never  ?Vaping Use  ? Vaping Use: Never used  ?Substance and Sexual Activity  ? Alcohol use: Yes  ?  Alcohol/week: 0.0 standard drinks  ?  Comment: Ocassionally "every six months or so"  ? Drug  use: Yes  ?  Types: Marijuana  ?  Comment: Last use 3 weeks ago  ? Sexual activity: Never  ?  Birth control/protection: Surgical, Post-menopausal  ?  Comment: Hysterectomy  ?Other Topics Concern  ? Not on file  ?Social History Narrative  ? Not on file  ? ?Social Determinants of Health  ? ?Financial Resource Strain: Not on file  ?Food Insecurity: Not on file  ?Transportation Needs: Not on file  ?Physical Activity: Not on file  ?Stress: Not on file  ?Social Connections: Not on file  ?Intimate Partner Violence: Not on file  ? ? ?Review of Systems  ?Constitutional:  Negative for fatigue.  ?Respiratory:  Negative for apnea.   ?Psychiatric/Behavioral:  Positive for sleep disturbance.   ? ?Vitals:  ? 12/04/21 0912  ?BP: 123/78  ?Pulse: (!) 57  ?Resp: (!) 98  ?Temp: 97.7 ?F (36.5 ?C)  ? ? ? ?Physical Exam ?Constitutional:   ?   Appearance: Normal appearance.  ?HENT:  ?   Head: Normocephalic.  ?   Mouth/Throat:  ?   Mouth: Mucous membranes are moist.  ?Cardiovascular:  ?   Rate and Rhythm: Normal rate and regular rhythm.  ?   Heart sounds: No murmur heard. ?  No friction rub.  ?Pulmonary:  ?   Effort: No respiratory distress.  ?   Breath sounds: No wheezing or rhonchi.  ?Musculoskeletal:  ?   Cervical back: No rigidity or tenderness.  ?Neurological:  ?   General: No focal deficit present.  ?   Mental Status: She is alert.  ?Psychiatric:     ?   Mood and Affect: Mood normal.  ? ? ? ?Data Reviewed: ?No significant test results to report ?Recent CBC, BMP within normal limits ? ?Referral records from [facility] reviewed ? ?Assessment:  ?Sleep maintenance insomnia ?-[name] has not been very helpful ?-She did use up to 3 mg of Lunesta but still only able to get 2 to 3 hours of sleep ? ?History of depression /anxiety ?-Controlled on medications ?-She feels symptoms of these ailments are well controlled ? ?-Did did not benefit from using Ambien in the past ?-Trazodone did not help ? ?History of hypertension ? ?History of chronic  migraines ? ?History of hyperthyroidism ? ?Plan/Recommendations: ?Behavioral modifications was discussed ?-Ensure she devotes about 6 to 8 hours of sleep ? ?We will change from Lunesta to Esparto ?-Start at 10 mg of Belsomra, may need a higher dose ?-Another dual Orexin antagonist may be tried ?-Silenor will be another option ? ?follow-up in 3 months ? ?  Encouraged to call with any significant concerns ? ?She will let us know if she feels she might need a higher dose of Belsomra ? ?Side effect profile of medication was reviewed ? ?Sherrilyn Rist MD ?[facility] ?12/04/2021, 9:31 AM ? ?CC: Heywood Bene, * ? ? ? ?

## 2021-12-07 ENCOUNTER — Telehealth: Payer: Self-pay | Admitting: Pulmonary Disease

## 2021-12-07 NOTE — Telephone Encounter (Signed)
Called patient and she states that she has been unable to sleep at all since Sunday. She states that she saw Dr Val Eagle on Monday and he changed her medication from [facility] to [facility] but it has not helped.  ? ?Dr Val Eagle please advise  ?

## 2021-12-08 NOTE — Telephone Encounter (Signed)
Called and spoke with pt who states due to not having any sleep since Monday 4/10, she ended up taking 1.5 tabs of her Belsomra. Pt said due to not having any sleep all week, she had not been feeling well. States that she also had a virus that was told by PCP to just let it run it's course. ? ? ?Pt said her BP had been elevated as BP yesterday 4/13 got up to 177/103 as pt had not been able to take her BP meds or anxiety meds due to the virus she had going on. ? ?Due to taking 1.5 tabs of her Belsomra 4/13, pt said that she was finally able to get some sleep. States that she got about 8-9 hours of sleep and states when she woke up this morning 4/14, she felt like a new person due to the amount of sleep she got. Pt said that she was able to wake up feeling fine. ? ? ?Due to this, pt wants to know what might be able to be advised with her sleep meds. Dr. Jenetta Downer, please advise. ?

## 2021-12-22 NOTE — Telephone Encounter (Signed)
So sorry to hear this.  Patient will need office visit to discuss sleep medicines.  Dr. Val Eagle has openings on Dec 26, 2021.  Please offer her an appointment then. ?It sounds like she has a lot going on with viral illness hypertension and according to last office note she has tried multiple sleep medicines without significant benefit. ? ?If needs sooner we will need to go to emergency room urgent care or discussed with primary care provider ? ?Please contact office for sooner follow up if symptoms do not improve or worsen or seek emergency care  ? ?

## 2021-12-22 NOTE — Telephone Encounter (Signed)
Called and spoke with patient who states that she has had to up her Belsomra from 10 mg to 15 mg due to not getting any sleep for over a week. She went and saw her "shrink" yesterday and they advised her to call the office to see if she can get an RX sent in for the 15 because the patient is actually getting a min of 6 hours of sleep now.  ? ?Tammy please advise as Dr. Wynona Neat is off till Monday ?

## 2021-12-22 NOTE — Telephone Encounter (Signed)
Called and spoke with patient. I was able to get her scheduled with AO on 12/25/21 at 1045am. She verbalized understanding.  ? ?Nothing further needed at time of call.  ?

## 2021-12-25 ENCOUNTER — Encounter: Payer: Self-pay | Admitting: Pulmonary Disease

## 2021-12-25 ENCOUNTER — Ambulatory Visit: Payer: Medicare HMO | Admitting: Pulmonary Disease

## 2021-12-25 VITALS — BP 110/72 | HR 65 | Temp 98.1°F | Ht 65.0 in | Wt 156.0 lb

## 2021-12-25 DIAGNOSIS — G4700 Insomnia, unspecified: Secondary | ICD-10-CM

## 2021-12-25 MED ORDER — BELSOMRA 15 MG PO TABS
15.0000 mg | ORAL_TABLET | Freq: Every evening | ORAL | 1 refills | Status: DC
Start: 2021-12-25 — End: 2022-03-07

## 2021-12-25 NOTE — Patient Instructions (Signed)
I will see you in about 3 months ? ?Prescription for Belsomra 15 mg to be used nightly sent into pharmacy ? ?Call with significant concerns ?

## 2021-12-25 NOTE — Progress Notes (Signed)
? ?      Heather Gray    [id number]    Aug 13, 1951 ? ?Primary Care Physician:Williams, Gracelyn Nurse, PA-C ? ?Referring Physician: Heywood Bene, PA-C ?[address] [place],  [place] [address] ? ?Chief complaint:   ?Patient is being seen for sleep maintenance insomnia ? ?HPI: ? ?Recent changes include increase in Seroquel to 350, dose of Belsomra increased to 15mg  ? ?Has been managing to get about 6 to 7 hours of sleep ? ?No grogginess during the day ? ?Feels she is resting much better ? ?Lunesta did not help ?A switch to 10 mg Belsomra was also ineffective ? ?Longstanding history of insomnia over 10 years ? ?Does not snore  ? ?No significant changes in her health since the last visit ?Follows up with behavioral therapy ? ?Sleep habits about the same ?Usually goes to bed between 8 and 11, about 30 minutes to fall asleep ?Final wake up time about 6 AM ? ?History of depression/anxiety ?-No changes in medications ? ?Reformed smoker quit about 30 years ago ? ?Reformed smoker quit over 30 years ago ? ?Recently quit using caffeinated beverages ? ?Has tried multiple medications in the past that have not helped including Ambien, Seroquel, trazodone, alprazolam ?-She remembers using as much as 300 mg of trazodone with no good effect ? ?She does have a history of depression/anxiety ? ?Outpatient Encounter Medications as of 12/25/2021  ?Medication Sig  ? alendronate (FOSAMAX) 70 MG tablet Take 70 mg by mouth once a week. Take with a full glass of water on an empty stomach.  ? ALPRAZolam (XANAX) 0.5 MG tablet Take 1.5 mg by mouth at bedtime.  ? amLODipine (NORVASC) 5 MG tablet Take 5 mg by mouth daily.  ? Calcium-Magnesium-Vitamin D (CALCIUM 1200+D3 PO) Take 1 tablet by mouth daily.  ? hydrochlorothiazide (HYDRODIURIL) 25 MG tablet Take 25 mg by mouth daily.  ? LAMICTAL 200 MG tablet Take 200 mg by mouth every morning.  ? losartan (COZAAR) 100 MG tablet Take 100 mg by mouth every morning.  ? QUEtiapine (SEROQUEL) 300  MG tablet Take 300 mg by mouth at bedtime.  ? rizatriptan (MAXALT) 10 MG tablet Take 10 mg by mouth as needed for migraine. May repeat in 2 hours if needed  ? Suvorexant (BELSOMRA) 10 MG TABS Take 10 mg by mouth at bedtime.  ? topiramate (TOPAMAX) 200 MG tablet Take 200 mg by mouth daily.   ? ?No facility-administered encounter medications on file as of 12/25/2021.  ? ? ?Allergies as of 12/25/2021 - Review Complete 12/04/2021  ?Allergen Reaction Noted  ? Tetracyclines & related Shortness Of Breath and Palpitations 10/16/2013  ? Sulfa antibiotics Other (See Comments) 02/10/2020  ? ? ?Past Medical History:  ?Diagnosis Date  ? Anxiety   ? Arthritis   ? Bradycardia   ? Depression   ? Hypertension   ? Hyperthyroidism   ? Migraine   ? Sleep apnea   ? No CPAP  ? Thyroid disease   ? hyperthyroid  ? ? ?Past Surgical History:  ?Procedure Laterality Date  ? ABDOMINAL HYSTERECTOMY    ? ABLATION SAPHENOUS VEIN W/ RFA Right   ? BLADDER SURGERY    ? Bladder Tack  ? BUNIONECTOMY Bilateral   ? HAMMER TOE SURGERY Bilateral   ? JOINT REPLACEMENT Left 2008  ? ROTATOR CUFF REPAIR Right   ? 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 Procedure: TOTAL HIP ARTHROPLASTY ANTERIOR APPROACH;  Surgeon: Rod Can, MD;  Location: [facility];  Service: Orthopedics;  Laterality: Right;  ? ? ?Family History  ?Problem Relation Age of Onset  ? Alcohol abuse Mother   ? Alcohol abuse Father   ? Schizophrenia Sister   ? Alcohol abuse Sister   ? ? ?Social History  ? ?Socioeconomic History  ? Marital status: Divorced  ?  Spouse name: Not on file  ? Number of children: 2  ? Years of education: Not on file  ? Highest education level: Not on file  ?Occupational History  ? Not on file  ?Tobacco Use  ? Smoking status: Former  ?  Packs/day: 2.00  ?  Years: 30.00  ?  Pack years: 60.00  ?  Types: Cigarettes  ?  Quit date: 10/16/1988  ?  Years since quitting: 33.2  ? Smokeless tobacco: Never  ?Vaping Use  ? Vaping Use: Never used  ?Substance and Sexual Activity  ?  Alcohol use: Yes  ?  Alcohol/week: 0.0 standard drinks  ?  Comment: Ocassionally "every six months or so"  ? Drug use: Yes  ?  Types: Marijuana  ?  Comment: Last use 3 weeks ago  ? Sexual activity: Never  ?  Birth control/protection: Surgical, Post-menopausal  ?  Comment: Hysterectomy  ?Other Topics Concern  ? Not on file  ?Social History Narrative  ? Not on file  ? ?Social Determinants of Health  ? ?Financial Resource Strain: Not on file  ?Food Insecurity: Not on file  ?Transportation Needs: Not on file  ?Physical Activity: Not on file  ?Stress: Not on file  ?Social Connections: Not on file  ?Intimate Partner Violence: Not on file  ? ? ?Review of Systems  ?Constitutional:  Negative for fatigue.  ?Respiratory:  Negative for apnea.   ?Psychiatric/Behavioral:  Positive for sleep disturbance.   ? ?There were no vitals filed for this visit. ? ? ?Physical Exam ?Constitutional:   ?   Appearance: Normal appearance.  ?HENT:  ?   Head: Normocephalic.  ?Cardiovascular:  ?   Rate and Rhythm: Normal rate and regular rhythm.  ?   Heart sounds: No murmur heard. ?  No friction rub.  ?Pulmonary:  ?   Effort: No respiratory distress.  ?   Breath sounds: No stridor. No wheezing or rhonchi.  ?Musculoskeletal:  ?   Cervical back: No rigidity or tenderness.  ?Neurological:  ?   General: No focal deficit present.  ?   Mental Status: She is alert.  ?Psychiatric:     ?   Mood and Affect: Mood normal.  ? ?Data Reviewed: ?No significant test results to report ?Recent CBC, BMP within normal limits ? ?Referral records from [facility] reviewed ? ?Assessment:  ?Sleep maintenance insomnia ?-Lunesta was not very helpful ?-10 mg of Belsomra did not help ?-Increase dose of Belsomra to 15 and trazodone at 350 appears to be helping optimally at present ? ?History of depression /anxiety ?-Controlled on medications ?-Depression is well controlled ? ?-Did not benefit from using Ambien in the past ?-Trazodone did not help ? ?History of  hypertension ? ?History of chronic migraines ? ?History of hyperthyroidism ? ?Plan/Recommendations: ?Ensure she is getting about 6 to 8 hours of sleep ? ?Prescription for Belsomra 50 mg sent to pharmacy ? ?follow-up in 3 months ? ?Encouraged to call with any significant concerns ? ?Side effect profile of medication was reviewed ? ?Sherrilyn Rist MD ?[facility] ?12/25/2021, 10:47 AM ? ?CC: Heywood Bene, * ? ? ? ?

## 2022-03-05 ENCOUNTER — Other Ambulatory Visit: Payer: Self-pay | Admitting: Pulmonary Disease

## 2022-03-05 NOTE — Telephone Encounter (Signed)
Please advise on refill request

## 2022-03-07 NOTE — Telephone Encounter (Signed)
Belsomra renewed

## 2022-03-12 ENCOUNTER — Telehealth: Payer: Self-pay | Admitting: Pulmonary Disease

## 2022-03-12 ENCOUNTER — Ambulatory Visit: Payer: Medicare HMO | Admitting: Pulmonary Disease

## 2022-03-12 NOTE — Telephone Encounter (Signed)
Dr Val Eagle ,  Patient is requesting refills for her    Belsomra 15mg    Please advise sir  Thank you

## 2022-03-13 ENCOUNTER — Other Ambulatory Visit: Payer: Self-pay | Admitting: Pulmonary Disease

## 2022-03-13 MED ORDER — BELSOMRA 15 MG PO TABS: 1.0000 | ORAL_TABLET | Freq: Every day | ORAL | 2 refills | Status: DC

## 2022-04-11 NOTE — Telephone Encounter (Signed)
Med refilled 7/18. Nothing further needed.

## 2022-04-17 ENCOUNTER — Ambulatory Visit: Payer: Medicare HMO | Admitting: Pulmonary Disease

## 2022-05-14 ENCOUNTER — Other Ambulatory Visit: Payer: Self-pay | Admitting: Physician Assistant

## 2022-05-14 DIAGNOSIS — E2839 Other primary ovarian failure: Secondary | ICD-10-CM

## 2022-05-14 DIAGNOSIS — Z1231 Encounter for screening mammogram for malignant neoplasm of breast: Secondary | ICD-10-CM

## 2022-05-16 ENCOUNTER — Encounter: Payer: Self-pay | Admitting: Pulmonary Disease

## 2022-05-16 ENCOUNTER — Ambulatory Visit: Payer: Medicare HMO | Admitting: Pulmonary Disease

## 2022-05-16 VITALS — BP 120/70 | HR 75 | Temp 98.4°F | Ht 65.0 in | Wt 157.8 lb

## 2022-05-16 DIAGNOSIS — G4700 Insomnia, unspecified: Secondary | ICD-10-CM

## 2022-05-16 NOTE — Patient Instructions (Signed)
Belsomra 15 mg does not seem to be working as well  We will try and finish out the prescription she has now and we may consider switching to [phone] if Belsomra is not helping  Follow-up in 3 months

## 2022-05-16 NOTE — Progress Notes (Signed)
Heather Gray    [id number]    May 25, 1951  Primary Care Physician:Williams, Karis Juba, PA-C  Referring Physician: Roger Kill, PA-C [address],  [place] [address]  Chief complaint:   Patient is being seen for sleep maintenance insomnia  HPI:  Recent changes include increase in Seroquel to 350, dose of Belsomra increased to 15mg   She feels the Belsomra did help initially but then has stopped working  She just got a recent prescription filled Lunesta did not help, 10 mg of Belsomra did not help, 15 mg helped for a while but no longer helping  Longstanding history of insomnia over 10 years  Does not snore   No significant changes in her health since the last visit Follows up with behavioral therapy  Sleep habits about the same Usually goes to bed between 8 and 11, about 30 minutes to fall asleep Final wake up time about 6 AM Multiple awakenings Following falling asleep will wake up about 2 hours later, difficulty falling back asleep  History of depression/anxiety -No changes in medications  Reformed smoker quit about 30 years ago  Reformed smoker quit over 30 years ago  Recently quit using caffeinated beverages  Has tried multiple medications in the past that have not helped including Ambien, Seroquel, trazodone, alprazolam -She remembers using as much as 300 mg of trazodone with no good effect  She does have a history of depression/anxiety  Outpatient Encounter Medications as of 05/16/2022  Medication Sig   alendronate (FOSAMAX) 70 MG tablet Take 70 mg by mouth once a week. Take with a full glass of water on an empty stomach.   ALPRAZolam (XANAX) 0.5 MG tablet Take 1.5 mg by mouth at bedtime.   amLODipine (NORVASC) 5 MG tablet Take 5 mg by mouth daily.   Calcium-Magnesium-Vitamin D (CALCIUM 1200+D3 PO) Take 1 tablet by mouth daily.   hydrochlorothiazide (HYDRODIURIL) 25 MG tablet Take 25 mg by mouth daily.   LAMICTAL 200 MG tablet  Take 200 mg by mouth every morning.   losartan (COZAAR) 100 MG tablet Take 100 mg by mouth every morning.   QUEtiapine (SEROQUEL) 300 MG tablet Take 350 mg by mouth at bedtime.   rizatriptan (MAXALT) 10 MG tablet Take 10 mg by mouth as needed for migraine. May repeat in 2 hours if needed   Suvorexant (BELSOMRA) 15 MG TABS Take 1 tablet by mouth at bedtime.   topiramate (TOPAMAX) 200 MG tablet Take 200 mg by mouth daily.    No facility-administered encounter medications on file as of 05/16/2022.    Allergies as of 05/16/2022 - Review Complete 05/16/2022  Allergen Reaction Noted   Tetracyclines & related Shortness Of Breath and Palpitations 10/16/2013   Sulfa antibiotics Other (See Comments) 02/10/2020    Past Medical History:  Diagnosis Date   Anxiety    Arthritis    Bradycardia    Depression    Hypertension    Hyperthyroidism    Migraine    Sleep apnea    No CPAP   Thyroid disease    hyperthyroid    Past Surgical History:  Procedure Laterality Date   ABDOMINAL HYSTERECTOMY     ABLATION SAPHENOUS VEIN W/ RFA Right    BLADDER SURGERY     Bladder Tack   BUNIONECTOMY Bilateral    HAMMER TOE SURGERY Bilateral    JOINT REPLACEMENT Left 2008   ROTATOR CUFF REPAIR Right    TOTAL HIP ARTHROPLASTY  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   Procedure: TOTAL HIP ARTHROPLASTY ANTERIOR APPROACH;  Surgeon: Rod Can, MD;  Location: [facility];  Service: Orthopedics;  Laterality: Right;    Family History  Problem Relation Age of Onset   Alcohol abuse Mother    Alcohol abuse Father    Schizophrenia Sister    Alcohol abuse Sister     Social History   Socioeconomic History   Marital status: Divorced    Spouse name: Not on file   Number of children: 2   Years of education: Not on file   Highest education level: Not on file  Occupational History   Not on file  Tobacco Use   Smoking status: Former    Packs/day: 2.00    Years: 30.00    Total pack years: 60.00    Types: Cigarettes    Quit date:  10/16/1988    Years since quitting: 33.6   Smokeless tobacco: Never  Vaping Use   Vaping Use: Never used  Substance and Sexual Activity   Alcohol use: Yes    Alcohol/week: 0.0 standard drinks of alcohol    Comment: Ocassionally "every six months or so"   Drug use: Yes    Types: Marijuana    Comment: Last use 3 weeks ago   Sexual activity: Never    Birth control/protection: Surgical, Post-menopausal    Comment: Hysterectomy  Other Topics Concern   Not on file  Social History Narrative   Not on file   Social Determinants of Health   Financial Resource Strain: Not on file  Food Insecurity: Not on file  Transportation Needs: Not on file  Physical Activity: Not on file  Stress: Not on file  Social Connections: Not on file  Intimate Partner Violence: Not on file    Review of Systems  Constitutional:  Negative for fatigue.  Respiratory:  Negative for apnea.   Psychiatric/Behavioral:  Positive for sleep disturbance.     Vitals:   05/16/22 1545  BP: 120/70  Pulse: 75  Temp: 98.4 F (36.9 C)  SpO2: 100%     Physical Exam Constitutional:      Appearance: Normal appearance.  HENT:     Head: Normocephalic.  Cardiovascular:     Rate and Rhythm: Normal rate and regular rhythm.     Heart sounds: No murmur heard.    No friction rub.  Pulmonary:     Effort: No respiratory distress.     Breath sounds: No stridor. No wheezing or rhonchi.  Musculoskeletal:     Cervical back: No rigidity or tenderness.  Neurological:     General: No focal deficit present.     Mental Status: She is alert.  Psychiatric:        Mood and Affect: Mood normal.    Data Reviewed: No significant test results to report Recent CBC, BMP within normal limits  Referral records from [facility] reviewed  Assessment:  Sleep maintenance insomnia -Heather Gray was not very helpful -10 mg of Belsomra did not help -Increased to 15 mg of Belsomra and this does not seem to be helping at present -We  will consider switching to daridorexant  History of depression /anxiety -Controlled on medications -Depression is well controlled  History of hypertension  History of chronic migraines  History of hyperthyroidism  Plan/Recommendations: Ensure she is getting about 6 to 8 hours of sleep  Continue Belsomra  Consider switching from Belsomra to daridorexant  Follow-up in 3 months  Medication side effect profile reviewed   Heather Sigmund  MD [facility] 05/16/2022, 4:00 PM  CC: Roger Kill, *

## 2022-06-04 ENCOUNTER — Telehealth: Payer: Self-pay | Admitting: Pulmonary Disease

## 2022-06-04 NOTE — Telephone Encounter (Signed)
Called patient and she states that she was down with her Belsomra. Pt states that she has 8 more doses left. She states she was advised by Dr Jenetta Downer to call and get [phone] called into her pharmacy.   Please advise sir

## 2022-06-05 ENCOUNTER — Other Ambulatory Visit: Payer: Self-pay | Admitting: Pulmonary Disease

## 2022-06-05 NOTE — Telephone Encounter (Signed)
Heather Gray is not covered by your insurance plan  Do you still want me to send it in?

## 2022-06-05 NOTE — Telephone Encounter (Signed)
Called and spoke with patient. She stated that she is ok with having the RX sent in and have the prior authorization started.

## 2022-06-06 ENCOUNTER — Other Ambulatory Visit: Payer: Self-pay | Admitting: Pulmonary Disease

## 2022-06-06 ENCOUNTER — Other Ambulatory Visit (HOSPITAL_COMMUNITY): Payer: Self-pay

## 2022-06-06 ENCOUNTER — Telehealth: Payer: Self-pay

## 2022-06-06 MED ORDER — DARIDOREXANT HCL 25 MG PO TABS: 25.0000 mg | ORAL_TABLET | Freq: Every evening | ORAL | 2 refills | Status: DC

## 2022-06-06 NOTE — Telephone Encounter (Signed)
PA notification received for Quviviq 25mg  tab. PA submitted in CMM.  Awaiting determination.  [place]: [id number] - PA Case ID: [id number]

## 2022-06-06 NOTE — Progress Notes (Signed)
Prescription for daridorexant placed 

## 2022-06-06 NOTE — Telephone Encounter (Signed)
Prescription for daridorexant placed

## 2022-06-06 NOTE — Telephone Encounter (Signed)
Called and spoke to patient and advised her that Heather Gray was sent into pharmacy. And I told her I would send a message about her prior auth that was needed. Patient verbalized understanding.   Can we get the prior auth started on medication for patient.   Thank you ladies

## 2022-06-07 ENCOUNTER — Other Ambulatory Visit (HOSPITAL_COMMUNITY): Payer: Self-pay

## 2022-06-07 NOTE — Telephone Encounter (Signed)
Received notification from [facility] regarding a prior authorization for [facility]. Authorization has been APPROVED from 06/06/2022 to 08/27/2023.   Per test claim, copay for 30 days supply is $95.00  [place]: [id number] - PA Case ID: [id number]  Approval letter attached in patient documents.

## 2022-06-27 NOTE — Telephone Encounter (Signed)
See encounter from 10/11.  

## 2022-07-30 ENCOUNTER — Ambulatory Visit: Payer: Medicare HMO | Admitting: Pulmonary Disease

## 2022-07-30 ENCOUNTER — Encounter: Payer: Self-pay | Admitting: Pulmonary Disease

## 2022-07-30 VITALS — BP 118/76 | HR 76 | Temp 97.6°F | Ht 64.0 in | Wt 171.4 lb

## 2022-07-30 DIAGNOSIS — G4700 Insomnia, unspecified: Secondary | ICD-10-CM

## 2022-07-30 MED ORDER — DARIDOREXANT HCL 25 MG PO TABS: 25.0000 mg | ORAL_TABLET | Freq: Every evening | ORAL | 5 refills | Status: DC

## 2022-07-30 NOTE — Progress Notes (Signed)
Heather Gray    [id number]    [birth year]  Primary Care Physician:Williams, Karis Juba, PA-C  Referring Physician: Roger Kill, PA-C [address] [place],  [place] [address]  Chief complaint:   Patient is being seen for sleep maintenance insomnia  HPI:  Was recently on Belsomra, this was changed to quiviviq. She also started taking over-the-counter melatonin skin patch On 350 mg of Seroquel for depression  Longstanding history of insomnia over 10 years  Does not snore   She feels she is sleeping better Now getting about 6 hours of sleep Able to go back to sleep if she wakes up in the middle of the night  No significant changes in her health since the last visit Follows up with behavioral therapy  Sleep habits about the same Usually goes to bed between 8 and 11, about 30 minutes to fall asleep Final wake up time about 6 AM Able to fall back asleep when she wakes up in the middle of his night  History of depression/anxiety -No changes in medications  Reformed smoker quit about 30 years ago  Reformed smoker quit over 30 years ago  Recently quit using caffeinated beverages  Has tried multiple medications in the past that have not helped including Ambien, Seroquel, trazodone, alprazolam -She remembers using as much as 300 mg of trazodone with no good effect  She does have a history of depression/anxiety  Outpatient Encounter Medications as of 07/30/2022  Medication Sig   alendronate (FOSAMAX) 70 MG tablet Take 70 mg by mouth once a week. Take with a full glass of water on an empty stomach.   ALPRAZolam (XANAX) 0.5 MG tablet Take 1.5 mg by mouth at bedtime.   amLODipine (NORVASC) 5 MG tablet Take 5 mg by mouth daily.   Calcium-Magnesium-Vitamin D (CALCIUM 1200+D3 PO) Take 1 tablet by mouth daily.   hydrochlorothiazide (HYDRODIURIL) 25 MG tablet Take 25 mg by mouth daily.   LAMICTAL 200 MG tablet Take 200 mg by mouth every morning.   losartan  (COZAAR) 100 MG tablet Take 100 mg by mouth every morning.   QUEtiapine (SEROQUEL) 300 MG tablet Take 350 mg by mouth at bedtime.   rizatriptan (MAXALT) 10 MG tablet Take 10 mg by mouth as needed for migraine. May repeat in 2 hours if needed   topiramate (TOPAMAX) 200 MG tablet Take 200 mg by mouth daily.    [DISCONTINUED] Daridorexant HCl 25 MG TABS Take 25 mg by mouth at bedtime.   Daridorexant HCl 25 MG TABS Take 25 mg by mouth at bedtime.   No facility-administered encounter medications on file as of 07/30/2022.    Allergies as of 07/30/2022 - Review Complete 07/30/2022  Allergen Reaction Noted   Tetracyclines & related Shortness Of Breath and Palpitations 10/16/2013   Sulfa antibiotics Other (See Comments) 02/10/2020    Past Medical History:  Diagnosis Date   Anxiety    Arthritis    Bradycardia    Depression    Hypertension    Hyperthyroidism    Migraine    Sleep apnea    No CPAP   Thyroid disease    hyperthyroid    Past Surgical History:  Procedure Laterality Date   ABDOMINAL HYSTERECTOMY     ABLATION SAPHENOUS VEIN W/ RFA Right    BLADDER SURGERY     Bladder Tack   BUNIONECTOMY Bilateral    HAMMER TOE SURGERY Bilateral    JOINT REPLACEMENT Left 2008  ROTATOR CUFF REPAIR Right    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   Procedure: TOTAL HIP ARTHROPLASTY ANTERIOR APPROACH;  Surgeon: Samson Frederic, MD;  Location: [facility];  Service: Orthopedics;  Laterality: Right;    Family History  Problem Relation Age of Onset   Alcohol abuse Mother    Alcohol abuse Father    Schizophrenia Sister    Alcohol abuse Sister     Social History   Socioeconomic History   Marital status: Divorced    Spouse name: Not on file   Number of children: 2   Years of education: Not on file   Highest education level: Not on file  Occupational History   Not on file  Tobacco Use   Smoking status: Former    Packs/day: 2.00    Years: 30.00    Total pack years: 60.00    Types:  Cigarettes    Quit date: 10/16/1988    Years since quitting: 33.8   Smokeless tobacco: Never  Vaping Use   Vaping Use: Never used  Substance and Sexual Activity   Alcohol use: Yes    Alcohol/week: 0.0 standard drinks of alcohol    Comment: Ocassionally "every six months or so"   Drug use: Yes    Types: Marijuana    Comment: Last use 3 weeks ago   Sexual activity: Never    Birth control/protection: Surgical, Post-menopausal    Comment: Hysterectomy  Other Topics Concern   Not on file  Social History Narrative   Not on file   Social Determinants of Health   Financial Resource Strain: Not on file  Food Insecurity: Not on file  Transportation Needs: Not on file  Physical Activity: Not on file  Stress: Not on file  Social Connections: Not on file  Intimate Partner Violence: Not on file    Review of Systems  Constitutional:  Negative for fatigue.  Respiratory:  Negative for apnea.   Psychiatric/Behavioral:  Positive for sleep disturbance.     Vitals:   07/30/22 0820  BP: 118/76  Pulse: 76  Temp: 97.6 F (36.4 C)  SpO2: 93%     Physical Exam Constitutional:      Appearance: Normal appearance.  HENT:     Head: Normocephalic.     Mouth/Throat:     Mouth: Mucous membranes are moist.  Eyes:     Pupils: Pupils are equal, round, and reactive to light.  Cardiovascular:     Rate and Rhythm: Normal rate and regular rhythm.     Heart sounds: No murmur heard.    No friction rub.  Pulmonary:     Effort: No respiratory distress.     Breath sounds: No stridor. No wheezing or rhonchi.  Musculoskeletal:     Cervical back: No rigidity or tenderness.  Neurological:     Mental Status: She is alert.  Psychiatric:        Mood and Affect: Mood normal.    Data Reviewed: No significant test results to report Recent CBC, BMP within normal limits  Referral records from [facility] reviewed  Assessment:  Sleep maintenance insomnia -On quivivic -Takes over-the-counter  [place]  History of depression /anxiety -Controlled on medications -Depression is well controlled  History of hypertension  History of chronic migraines  History of hyperthyroidism  Plan/Recommendations: Ensure adequate number of hours of sleep  Continue current medications  If she gets to a point where she wants to stop [phone] for a while, encouraged to hold it off for a few  days to see whether she still needs it  Follow-up in 6 months  Call with significant concerns   Virl Diamond MD [facility] 07/30/2022, 8:44 AM  CC: Roger Kill, *

## 2022-07-30 NOTE — Patient Instructions (Signed)
Continue current medications  You may try to hold off on Quiviviq for couple of days to see whether it is helping you If you figure out its help and then continue on it regularly  I will see you back in about 6 months  Continue to make sure you are active on a regular basis  Call with significant concerns

## 2022-11-19 ENCOUNTER — Ambulatory Visit
Admission: RE | Admit: 2022-11-19 | Discharge: 2022-11-19 | Disposition: A | Discharge status: Home / Self Care | Payer: Medicare HMO | Source: Ambulatory Visit | Attending: Physician Assistant | Admitting: Physician Assistant

## 2022-11-19 DIAGNOSIS — Z1231 Encounter for screening mammogram for malignant neoplasm of breast: Secondary | ICD-10-CM

## 2022-11-19 DIAGNOSIS — E2839 Other primary ovarian failure: Secondary | ICD-10-CM

## 2022-11-20 ENCOUNTER — Telehealth: Payer: Self-pay | Admitting: Pulmonary Disease

## 2022-11-20 NOTE — Telephone Encounter (Signed)
Called pt and there was no answer-LMTCB °

## 2022-11-20 NOTE — Telephone Encounter (Signed)
Patient would like to start back on Lunesta. Pharmacy is [facility] [place]. Patient phone number is [phone].

## 2022-11-20 NOTE — Telephone Encounter (Signed)
Patient is returning phone call. Patient phone number is [phone].

## 2022-11-21 NOTE — Telephone Encounter (Signed)
Called and spoke with patient. She stated that the Quviviq 25mg  is not helping with her insomnia. She is still not able to sleep. She has been on the medication since December 2023. She also mentioned that it costs her $95 a month.   She wants to go back to the [place]. She spoke with her therapist about this as well and they both agreed the [name] will probably be a better solution for her.   [facility] [place]   Dr. Ander Slade, can you please advise? Thanks!

## 2022-11-22 ENCOUNTER — Other Ambulatory Visit: Payer: Self-pay | Admitting: Pulmonary Disease

## 2022-11-22 MED ORDER — ESZOPICLONE 3 MG PO TABS: 3.0000 mg | ORAL_TABLET | Freq: Every day | ORAL | 2 refills | Status: DC

## 2022-11-22 NOTE — Telephone Encounter (Signed)
Spoke with patient. Advised Heather Gray has been sent to her pharmacy. NFN

## 2022-11-22 NOTE — Telephone Encounter (Signed)
Prescription for Lunesta sent into pharmacy °

## 2022-11-28 ENCOUNTER — Telehealth: Payer: Self-pay | Admitting: Pulmonary Disease

## 2022-11-28 NOTE — Telephone Encounter (Signed)
PT calling for RX Eszopiclone 3 MG TABS .  She states we sent it in but the pharm said there was an issue and it needed to be corrected and resent.   We have already called and verified this with her, we just need to figure out what issues the Pharmacy has with the script/   PT on jury duty so not avail to take a call.

## 2022-11-29 NOTE — Telephone Encounter (Signed)
ATC X1 LVM for patient to call the office back 

## 2022-12-04 NOTE — Telephone Encounter (Signed)
Rerouting for 2nd call attempt

## 2022-12-05 NOTE — Telephone Encounter (Signed)
ATC LVMTCB x 2 sending letter and closing encounter per office policy

## 2023-02-27 ENCOUNTER — Other Ambulatory Visit: Payer: Self-pay | Admitting: Pulmonary Disease

## 2023-05-20 ENCOUNTER — Other Ambulatory Visit: Payer: Self-pay | Admitting: Pulmonary Disease

## 2023-05-20 NOTE — Telephone Encounter (Signed)
PT had to resched her appt but needs meds before her next appt date. Pls call [phone] if you have questions.  [facility] [place]  Med is [place] generic

## 2023-05-22 ENCOUNTER — Ambulatory Visit: Payer: Medicare HMO | Admitting: Pulmonary Disease

## 2023-05-23 MED ORDER — ESZOPICLONE 3 MG PO TABS: 3.0000 mg | ORAL_TABLET | Freq: Every day | ORAL | 2 refills | Status: DC

## 2023-05-24 NOTE — Telephone Encounter (Signed)
Refill sent 9/26.

## 2023-07-11 IMAGING — MG MM DIGITAL SCREENING BILAT W/ TOMO AND CAD
8 series · 8 of 24 positions shown · non-contrast
Comparison: Previous exam(s).

CLINICAL DATA: Screening.

EXAM:
DIGITAL SCREENING BILATERAL MAMMOGRAM WITH TOMOSYNTHESIS AND CAD
TECHNIQUE: Bilateral screening digital craniocaudal and mediolateral oblique
mammograms were obtained. Bilateral screening digital breast
tomosynthesis was performed. The images were evaluated with
computer-aided detection.

[R CC synth-2D]
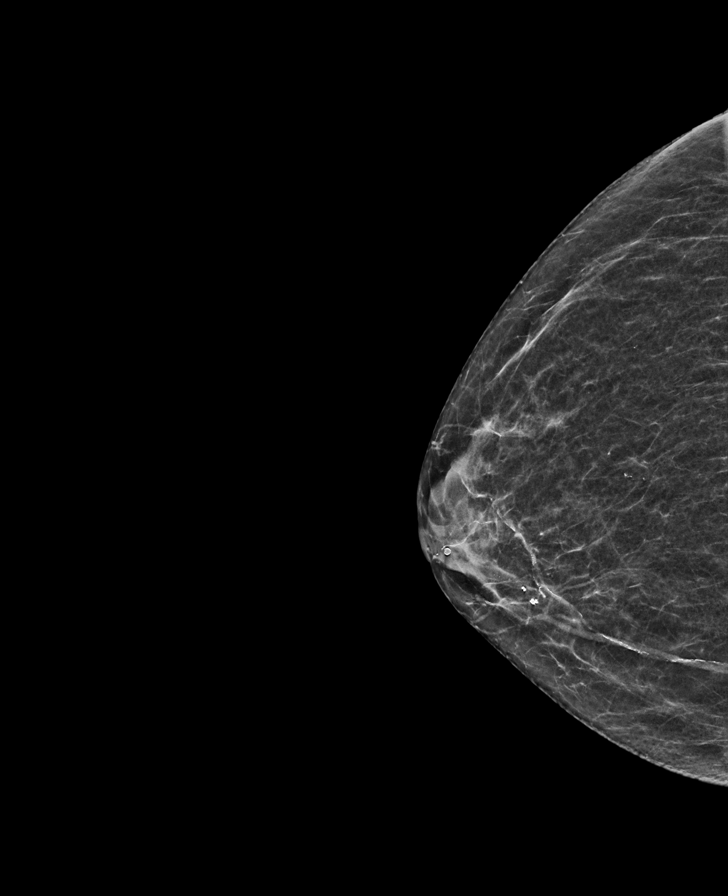

[R MLO synth-2D]
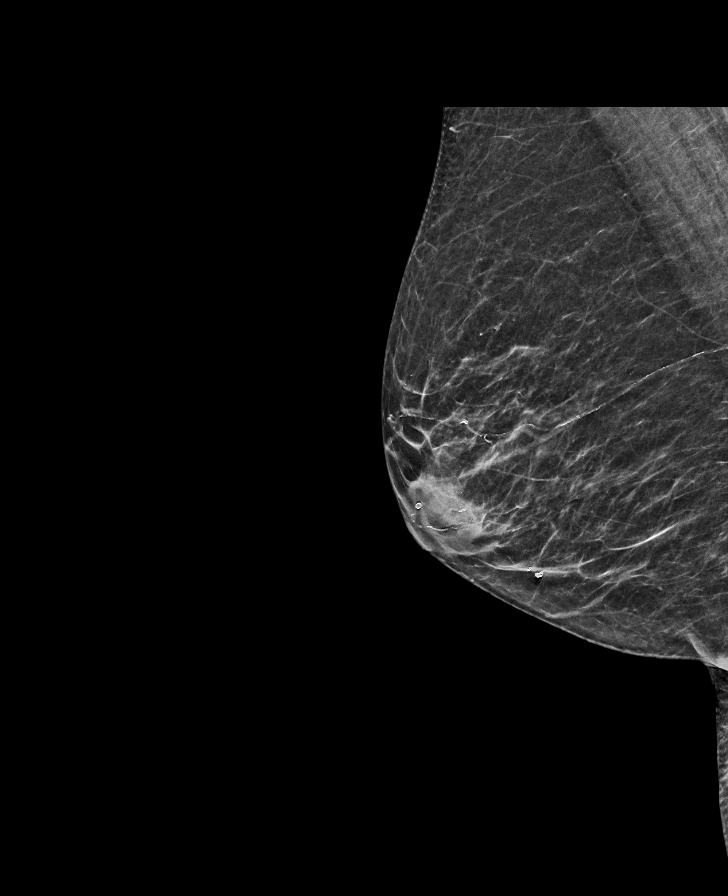

[L MLO synth-2D]
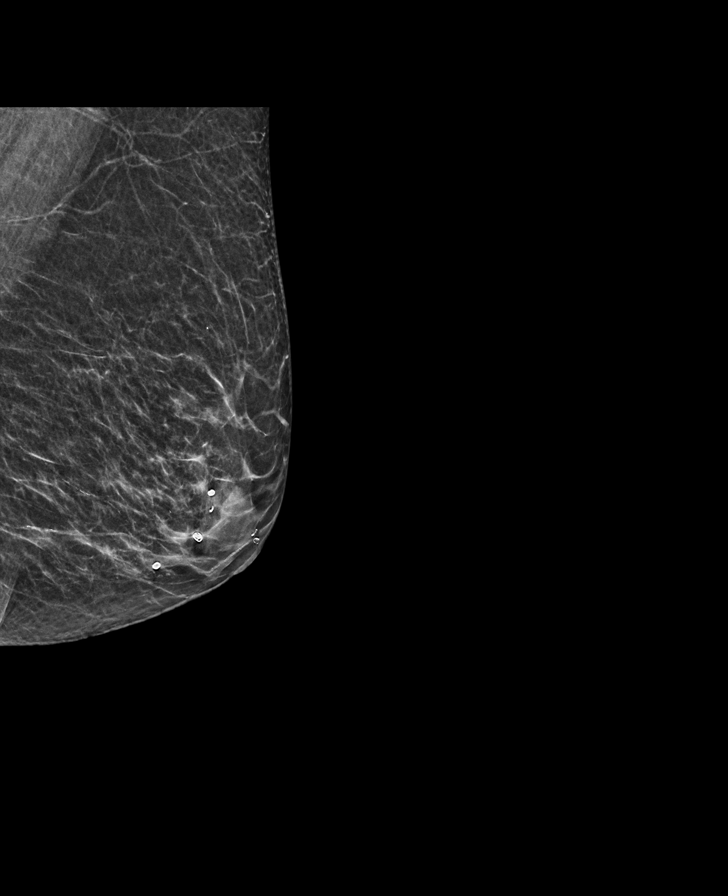

[L CC synth-2D]
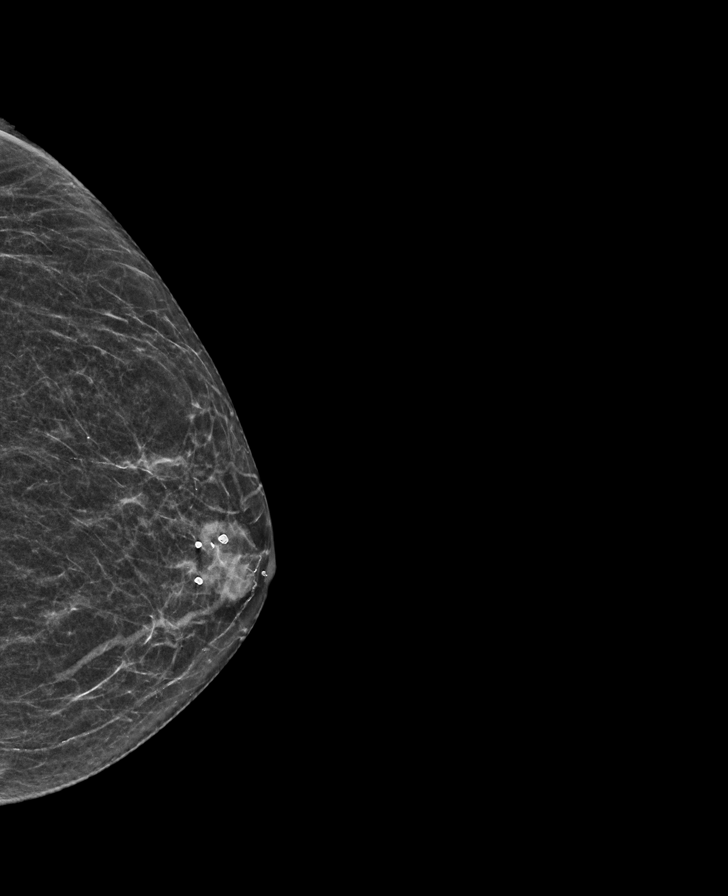

[R CC tomo · tomo slice 25/49.0]
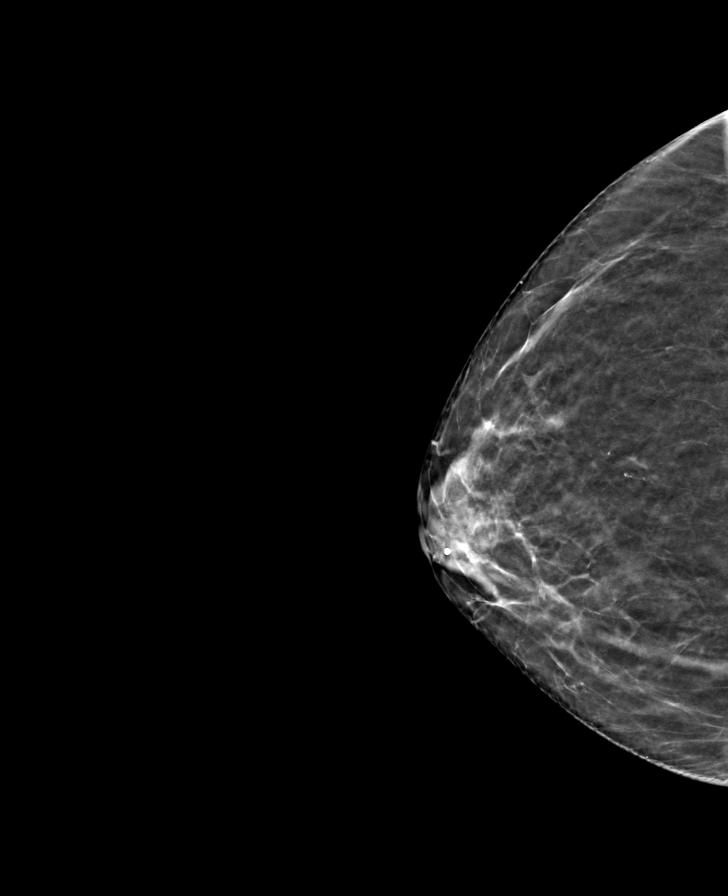

[L CC tomo · tomo slice 25/48.0]
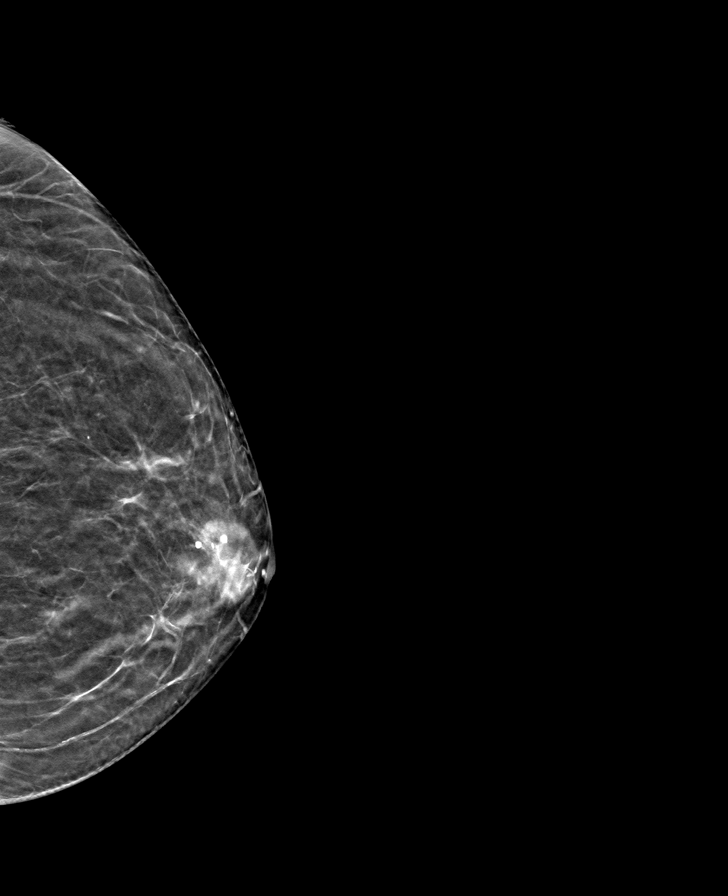

[R MLO tomo · tomo slice 25/50.0]
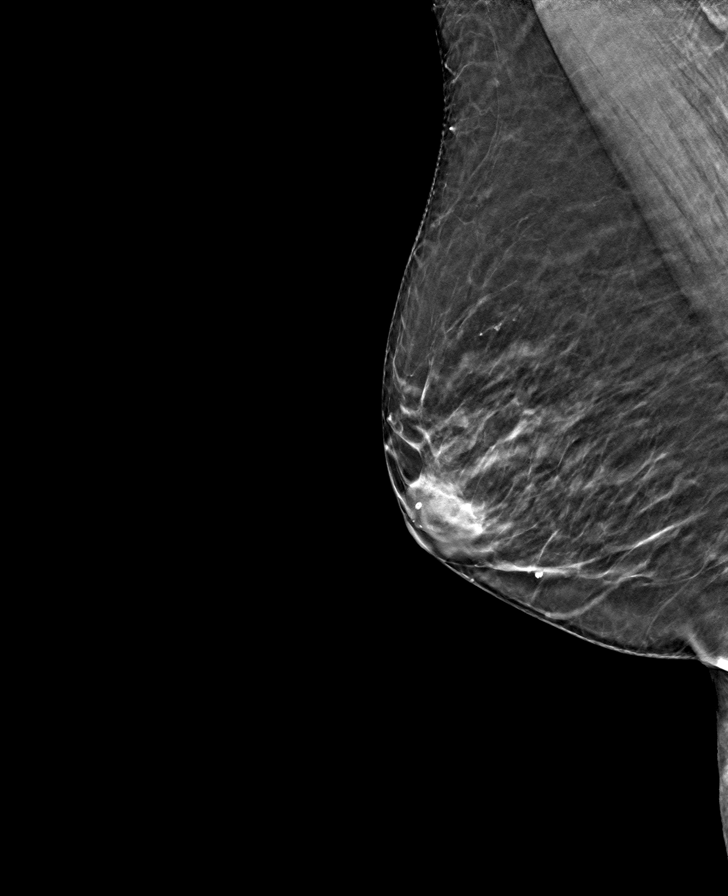

[L MLO tomo · tomo slice 25/48.0]
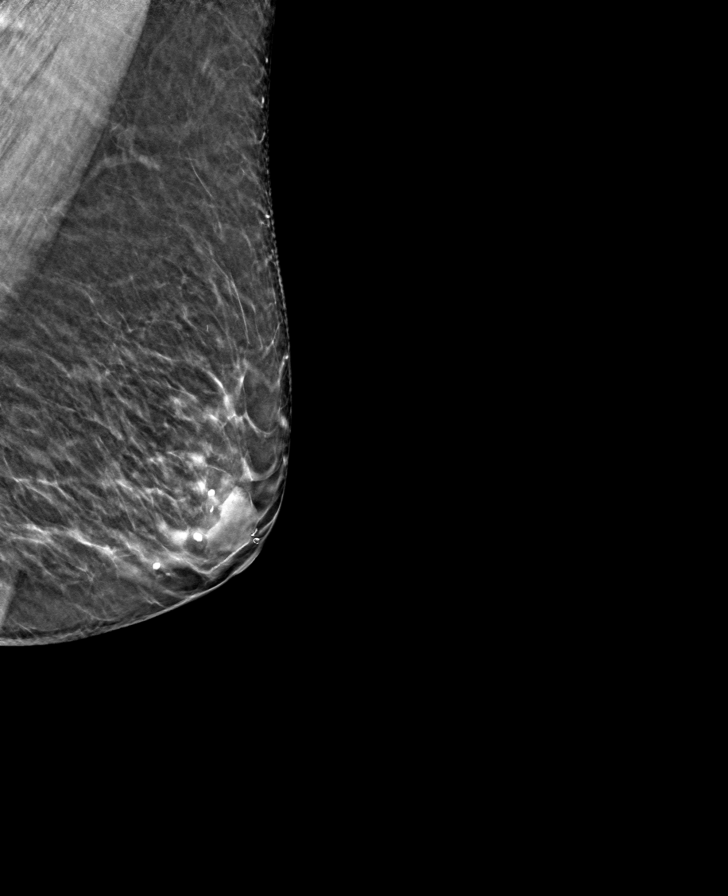

[8 of 24 positions shown; findings below may reference images not displayed]

ACR Breast Density Category b: There are scattered areas of
fibroglandular density.
FINDINGS: There are no findings suspicious for malignancy.
IMPRESSION: No mammographic evidence of malignancy. A result letter of this
screening mammogram will be mailed directly to the patient.

RECOMMENDATION:
Screening mammogram in one year. (Code:51-O-LD2)

BI-RADS CATEGORY  1: Negative.

## 2023-07-18 ENCOUNTER — Ambulatory Visit: Payer: Medicare HMO | Admitting: Pulmonary Disease

## 2023-07-18 ENCOUNTER — Encounter: Payer: Self-pay | Admitting: Pulmonary Disease

## 2023-07-18 VITALS — BP 111/77 | HR 59 | Ht 65.0 in | Wt 175.0 lb

## 2023-07-18 DIAGNOSIS — G4700 Insomnia, unspecified: Secondary | ICD-10-CM | POA: Diagnosis not present

## 2023-07-18 NOTE — Progress Notes (Signed)
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  HPI:  Heather Gray seems to be helping at 3 mg Gets about 4 hours of sleep Not as sleepy during the day  She tries to stay active  Able to fall asleep easily with Lunesta but wakes up after about 3 to 4 hours and sometimes not able to go back to sleep  Medications for depression being optimized  Longstanding history of insomnia for over 10 years  Does not snore   She feels she is sleeping better Still with about 4 hours of sleep Still with difficulty trying to get back to sleep  No significant changes in her health since the last visit Follows up with behavioral therapy  Sleep habits about the same Usually goes to bed between 8 and 11, about 30 minutes to fall asleep Final wake up time about 6 AM  History of depression/anxiety -No changes in medications  Reformed smoker quit about 30 years ago  Reformed smoker quit over 30 years ago  Recently quit using caffeinated beverages  Has tried multiple medications in the past that have not helped including Ambien, Seroquel, trazodone, alprazolam -She remembers using as much as 300 mg of trazodone with no good effect Lunesta seems to be helping  She does have a history of depression/anxiety  Outpatient Encounter Medications as of 07/18/2023  Medication Sig   alendronate (FOSAMAX) 70 MG tablet Take 70 mg by mouth once a week. Take with a full glass of water on an empty stomach.   ALPRAZolam (XANAX) 0.5 MG tablet Take 1.5 mg by mouth at bedtime.   amLODipine (NORVASC) 5 MG tablet Take 5 mg by mouth daily.   Calcium-Magnesium-Vitamin D (CALCIUM 1200+D3 PO) Take 1 tablet by mouth daily.   Eszopiclone 3 MG TABS Take 1 tablet (3 mg total) by mouth at  bedtime. Take immediately before bedtime   hydrochlorothiazide (HYDRODIURIL) 25 MG tablet Take 25 mg by mouth daily.   LAMICTAL 200 MG tablet Take 200 mg by mouth every morning.   losartan (COZAAR) 100 MG tablet Take 100 mg by mouth every morning.   QUEtiapine (SEROQUEL) 300 MG tablet Take 350 mg by mouth at bedtime.   rizatriptan (MAXALT) 10 MG tablet Take 10 mg by mouth as needed for migraine. May repeat in 2 hours if needed   topiramate (TOPAMAX) 200 MG tablet Take 200 mg by mouth daily.    Daridorexant HCl 25 MG TABS Take 25 mg by mouth at bedtime. (Patient not taking: Reported on 07/18/2023)   No facility-administered encounter medications on file as of 07/18/2023.    Allergies as of 07/18/2023 - Review Complete 07/18/2023  Allergen Reaction Noted   Tetracyclines & related Shortness Of Breath and Palpitations 10/16/2013   Sulfa antibiotics Other (See Comments) 02/10/2020    Past Medical History:  Diagnosis Date   Anxiety    Arthritis    Bradycardia    Depression    Hypertension    Hyperthyroidism    Migraine    Sleep apnea    No CPAP   Thyroid disease    hyperthyroid    Past Surgical History:  Procedure Laterality Date   ABDOMINAL HYSTERECTOMY     ABLATION SAPHENOUS VEIN W/ RFA  Right    BLADDER SURGERY     Bladder Tack   BUNIONECTOMY Bilateral    HAMMER TOE SURGERY Bilateral    JOINT REPLACEMENT Left 2008   ROTATOR CUFF REPAIR Right    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   Procedure: TOTAL HIP ARTHROPLASTY ANTERIOR APPROACH;  Surgeon: Samson Frederic, MD;  Location: [facility];  Service: Orthopedics;  Laterality: Right;    Family History  Problem Relation Age of Onset   Alcohol abuse Mother    Alcohol abuse Father    Schizophrenia Sister    Alcohol abuse Sister     Social History   Socioeconomic History   Marital status: Divorced    Spouse name: Not on file   Number of children: 2   Years of education: Not on file   Highest education level: Not on  file  Occupational History   Not on file  Tobacco Use   Smoking status: Former    Current packs/day: 0.00    Average packs/day: 2.0 packs/day for 30.0 years (60.0 ttl pk-yrs)    Types: Cigarettes    Start date: 10/16/1958    Quit date: 10/16/1988    Years since quitting: 34.7   Smokeless tobacco: Never  Vaping Use   Vaping status: Never Used  Substance and Sexual Activity   Alcohol use: Yes    Alcohol/week: 0.0 standard drinks of alcohol    Comment: Ocassionally "every six months or so"   Drug use: Yes    Types: Marijuana    Comment: Last use 3 weeks ago   Sexual activity: Never    Birth control/protection: Surgical, Post-menopausal    Comment: Hysterectomy  Other Topics Concern   Not on file  Social History Narrative   Not on file   Social Determinants of Health   Financial Resource Strain: Low Risk  (05/07/2022)   [facility] from [facility] to 10[facility]27[facility]22[facility] prior to 10/27/2022[facility]   Overall Financial Resource Strain (CARDIA)    Difficulty of Paying Living Expenses: Not hard at all  Food Insecurity: Low Risk  (05/09/2023)   [facility]   Hunger Vital Sign    Worried About Running Out of Food in the Last Year: Never true    Ran Out of Food in the Last Year: Never true  Transportation Needs: No Transportation Needs (05/09/2023)   Received from [facility]    In the past 12 months, has lack of reliable transportation kept you from medical appointments, meetings, work or from getting things needed for daily living? : No  Physical Activity: Sufficiently Active (05/07/2022)   [facility], [facility] visits prior to 10/27/2022., [facility] visits prior to 10/27/2022., [facility]   Exercise Vital Sign    Days of Exercise per Week: 5 days    Minutes of Exercise per Session: 50 min  Stress:  Stress Concern Present (05/07/2022)   [facility] prior to 10/27/2022[facility], [facility] visits prior to 10/27/2022., [facility]   [employer] of Occupational Health - Occupational Stress Questionnaire    Feeling of Stress : To some extent  Social Connections: Moderately Integrated (05/07/2022)   [facility], [facility] prior to 10/27/2022., [facility] visits prior to 10/27/2022., [facility]   Social  Connection and Isolation Panel [NHANES]    Frequency of Communication with Friends and Family: Twice a week    Frequency of Social Gatherings with Friends and Family: Once a week    Attends Religious Services: 1 to 4 times per year    Active Member of [employer] or Organizations: Yes    Attends Banker Meetings: 1 to 4 times per year    Marital Status: Divorced  Intimate Partner Violence: Not At Risk (05/07/2022)   [facility] prior to 10/27/2022., [facility] visits prior to 10/27/2022.   Humiliation, Afraid, Rape, and Kick questionnaire    Fear of Current or Ex-Partner: No    Emotionally Abused: No    Physically Abused: No    Sexually Abused: No    Review of Systems  Constitutional:  Negative for fatigue.  Respiratory:  Negative for apnea.   Psychiatric/Behavioral:  Positive for sleep disturbance.     Vitals:   07/18/23 1013  BP: 111/77  Pulse: (!) 59  SpO2: 98%     Physical Exam Constitutional:      Appearance: Normal appearance.  HENT:     Head: Normocephalic.     Mouth/Throat:     Mouth: Mucous membranes are moist.  Eyes:     Pupils: Pupils are equal, round, and reactive to light.  Cardiovascular:     Rate and Rhythm: Normal rate and regular rhythm.     Heart sounds: No murmur heard.    No friction rub.  Pulmonary:     Effort: No respiratory distress.      Breath sounds: No stridor. No wheezing or rhonchi.  Musculoskeletal:     Cervical back: No rigidity or tenderness.  Neurological:     Mental Status: She is alert.  Psychiatric:        Mood and Affect: Mood normal.   Data Reviewed: No significant test results to report Recent CBC, BMP within normal limits  [facility]  Assessment:  Sleep maintenance insomnia Continue Lunesta  History of depression /anxiety -Controlled on medications History of hypertension  History of chronic migraines  History of hyperthyroidism  Plan/Recommendations: Ensure adequate number of hours of sleep  Continue Lunesta  Follow-up in 6 months  Encouraged to call with significant concerns   Virl Diamond MD [employer] [facility] 07/18/2023, 10:27 AM  CC: Roger Kill, *

## 2023-07-18 NOTE — Patient Instructions (Signed)
I will see you back in about a year  Send [place] a chart message or request if you are running down on the [place]  Call us with significant concerns

## 2023-09-01 ENCOUNTER — Other Ambulatory Visit: Payer: Self-pay | Admitting: Pulmonary Disease

## 2023-09-02 NOTE — Telephone Encounter (Signed)
Pt requesting refill, please advise.

## 2024-03-09 NOTE — Progress Notes (Signed)
 Surgery orders requested via Epic inbox.

## 2024-03-12 NOTE — Progress Notes (Signed)
 Second request for pre op orders sent to [facility] via [facility].

## 2024-03-15 NOTE — Progress Notes (Signed)
 COVID Vaccine received:  []  No [x]  Yes Date of any COVID positive Test in last 60 days:None  PCP -  Bernardino Level, FNP  [phone] (Work)  [phone] (Fax)    Clearance scanned to Media.  Cardiologist - none  PCP follows HTN  Chest x-ray - 04-11-2022  2v  CE EKG - 06-23-2020    will repeat  Stress Test - 03-07-2020  Epic ECHO - 02-18-2020  Epic Cardiac Cath -  CT Coronary Calcium score:  Zio monitor- 03-23-2020  Epic  Pacemaker / ICD device [x]  No []  Yes   Spinal Cord Stimulator:[x]  No []  Yes       History of Sleep Apnea? []  No [x]  Yes   CPAP used?- [x]  No []  Yes    Does the patient monitor blood sugar?   [x]  N/A   []  No []  Yes  Patient has: [x]  NO Hx DM   []  Pre-DM   []  DM1  []   DM2  Blood Thinner / Instructions:  none Aspirin  Instructions:  none  ERAS Protocol Ordered: []  No  []  Yes PRE-SURGERY []  ENSURE  []  G2   []  No Drink Ordered  Patient is to be NPO after:  MN d/t  NO ORDERS  Dental hx: [x]  Dentures: top denture []  N/A      [x]  Bridge or Partial: bottom Partial                  []  Loose or Damaged teeth:   Comments: Patient was given the 5 CHG shower / bath instructions for THA surgery along with 2 bottles of the CHG soap. Patient will start this on:  03-22-24            Activity level: Able to walk up 2 flights of stairs without becoming significantly short of breath or having chest pain?  [x]  No Some SOB and severe leg pain  []    Yes   Anesthesia review: HTN, Bipolar- anxiety/ depression, Bradycardia, OSA- no CPAP, CKD2  Patient denies any S&S of respiratory illness or Covid - no shortness of breath, fever, cough or chest pain at PAT appointment.  Patient verbalized understanding and agreement to the Pre-Surgical Instructions that were given to them at this PAT appointment. Patient was also educated of the need to review these PAT instructions again prior to her surgery.I reviewed the appropriate phone numbers to call if they have any and  questions or concerns.

## 2024-03-15 NOTE — Patient Instructions (Addendum)
 SURGICAL WAITING ROOM VISITATION Patients having surgery or a procedure may have no more than 2 support people in the waiting area - these visitors may rotate in the visitor waiting room.   If the patient needs to stay at the hospital during part of their recovery, the visitor guidelines for inpatient rooms apply.  PRE-OP VISITATION  Pre-op nurse will coordinate an appropriate time for 1 support person to accompany the patient in pre-op.  This support person may not rotate.  This visitor will be contacted when the time is appropriate for the visitor to come back in the pre-op area.  Please refer to the [facility] website for the visitor guidelines for Inpatients (after your surgery is over and you are in a regular room).  You are not required to quarantine at this time prior to your surgery. However, you must do this: Hand Hygiene often Do NOT share personal items Notify your provider if you are in close contact with someone who has COVID or you develop fever 100.4 or greater, new onset of sneezing, cough, sore throat, shortness of breath or body aches.  If you test positive for Covid or have been in contact with anyone that has tested positive in the last 10 days please notify you surgeon.    Your procedure is scheduled on:  THURSDAY  March 26, 2024  Report to [facility] Main Entrance: [address] entrance where the [employer] is available.   Report to admitting at:  08:00   AM  Call this number if you have any questions or problems the morning of surgery [phone]  FOLLOW ANY ADDITIONAL PRE OP INSTRUCTIONS YOU RECEIVED FROM YOUR SURGEON'S OFFICE!!!  Do not eat or drink after Midnight the night prior to your surgery/procedure.    Oral Hygiene is also important to reduce your risk of infection.        Remember - BRUSH YOUR TEETH THE MORNING OF SURGERY WITH YOUR REGULAR TOOTHPASTE  Do NOT smoke after Midnight the night before surgery.  STOP TAKING all Vitamins, Herbs and  supplements 1 week before your surgery.    DO NOT TAKE Fosamax on Monday 03-22-24  Take ONLY these medicines the morning of surgery with A SIP OF WATER : Amlodipine, Lamictal, and Tramadol if needed for pain.   DO NOT TAKE Losartan  the morning of your surgery.                    You may not have any metal on your body including hair pins, jewelry, and body piercing  Do not wear make-up, lotions, powders, perfumes or deodorant  Do not wear nail polish including gel and S&S, artificial / acrylic nails, or any other type of covering on natural nails including finger and toenails. If you have artificial nails, gel coating, etc., that needs to be removed by a nail salon, Please have this removed prior to surgery. Not doing so may mean that your surgery could be cancelled or delayed if the Surgeon or anesthesia staff feels like they are unable to monitor you safely.   Do not shave 48 hours prior to surgery to avoid nicks in your skin which may contribute to postoperative infections.   Contacts, Hearing Aids, dentures or bridgework may not be worn into surgery. DENTURES WILL BE REMOVED PRIOR TO SURGERY PLEASE DO NOT APPLY Poly grip OR ADHESIVES!!!  You may bring a small overnight bag with you on the day of surgery, only pack items that are not valuable.  [facility] IS NOT RESPONSIBLE   FOR VALUABLES THAT ARE LOST OR STOLEN.   Do not bring your home medications to the hospital. The Pharmacy will dispense medications listed on your medication list to you during your admission in the Hospital.  Please read over the following fact sheets you were given: IF YOU HAVE QUESTIONS ABOUT YOUR PRE-OP INSTRUCTIONS, PLEASE CALL [phone]     Pre-operative 5 CHG Bath Instructions   You can play a [place] role in reducing the risk of infection after surgery. Your skin needs to be as free of germs as possible. You can reduce the number of germs on your skin by washing with CHG (chlorhexidine  gluconate) soap  before surgery. CHG is an antiseptic soap that kills germs and continues to kill germs even after washing.   DO NOT use if you have an allergy to chlorhexidine /CHG or antibacterial soaps. If your skin becomes reddened or irritated, stop using the CHG and notify one of our RNs at [phone]  Please shower with the CHG soap starting 4 days before surgery using the following schedule: START SHOWERS ON SUNDAY March 22, 2024                                                                                                                                                                              Please keep in mind the following:  DO NOT shave, including legs and underarms, starting the day of your first shower.   You may shave your face at any point before/day of surgery.   Place clean sheets on your bed the day you start using CHG soap. Use a clean washcloth (not used since being washed) for each shower. DO NOT sleep with pets once you start using the CHG.   CHG Shower Instructions:  If you choose to wash your hair and private area, wash first with your normal shampoo/soap.  After you use shampoo/soap, rinse your hair and body thoroughly to remove shampoo/soap residue.  Turn the water  OFF and apply about 3 tablespoons (45 ml) of CHG soap to a CLEAN washcloth.  Apply CHG soap ONLY FROM YOUR NECK DOWN TO YOUR TOES (washing for 3-5 minutes)  DO NOT use CHG soap on face, private areas, open wounds, or sores.  Pay special attention to the area where your surgery is being performed.  If you are having back surgery, having someone wash your back for you may be helpful.  Wait 2 minutes after CHG soap is applied, then you may rinse off the CHG soap.  Pat dry with a clean towel  Put on clean clothes/pajamas   If you choose to wear lotion, please use ONLY the CHG-compatible lotions on the  back of this paper.     Additional instructions for the day of surgery: DO NOT APPLY any lotions, deodorants,  cologne, or perfumes.   Put on clean/comfortable clothes.  Brush your teeth.  Ask your nurse before applying any prescription medications to the skin.      CHG Compatible Lotions   Aveeno Moisturizing lotion  Cetaphil Moisturizing Cream  Cetaphil Moisturizing Lotion  Clairol Herbal Essence Moisturizing Lotion, Dry Skin  Clairol Herbal Essence Moisturizing Lotion, Extra Dry Skin  Clairol Herbal Essence Moisturizing Lotion, Normal Skin  Curel Age Defying Therapeutic Moisturizing Lotion with Alpha Hydroxy  Curel Extreme Care Body Lotion  Curel Soothing Hands Moisturizing Hand Lotion  Curel Therapeutic Moisturizing Cream, Fragrance-Free  Curel Therapeutic Moisturizing Lotion, Fragrance-Free  Curel Therapeutic Moisturizing Lotion, Original Formula  Eucerin Daily Replenishing Lotion  Eucerin Dry Skin Therapy Plus Alpha Hydroxy Crme  Eucerin Dry Skin Therapy Plus Alpha Hydroxy Lotion  Eucerin Original Crme  Eucerin Original Lotion  Eucerin Plus Crme Eucerin Plus Lotion  Eucerin TriLipid Replenishing Lotion  Keri Anti-Bacterial Hand Lotion  Keri Deep Conditioning Original Lotion Dry Skin Formula Softly Scented  Keri Deep Conditioning Original Lotion, Fragrance Free Sensitive Skin Formula  Keri Lotion Fast Absorbing Fragrance Free Sensitive Skin Formula  Keri Lotion Fast Absorbing Softly Scented Dry Skin Formula  Keri Original Lotion  Keri Skin Renewal Lotion Keri Silky Smooth Lotion  Keri Silky Smooth Sensitive Skin Lotion  Nivea Body Creamy Conditioning Oil  Nivea Body Extra Enriched Lotion  Nivea Body Original Lotion  Nivea Body Sheer Moisturizing Lotion Nivea Crme  Nivea Skin Firming Lotion  NutraDerm 30 Skin Lotion  NutraDerm Skin Lotion  NutraDerm Therapeutic Skin Cream  NutraDerm Therapeutic Skin Lotion  ProShield Protective Hand Cream  Provon moisturizing lotion   FAILURE TO FOLLOW THESE INSTRUCTIONS MAY RESULT IN THE CANCELLATION OF YOUR SURGERY  PATIENT  SIGNATURE_________________________________  NURSE SIGNATURE__________________________________  ________________________________________________________________________       Nasario Exon    An incentive spirometer is a tool that can help keep your lungs clear and active. This tool measures how well you are filling your lungs with each breath. Taking long deep breaths may help reverse or decrease the chance of developing breathing (pulmonary) problems (especially infection) following: A long period of time when you are unable to move or be active. BEFORE THE PROCEDURE  If the spirometer includes an indicator to show your best effort, your nurse or respiratory therapist will set it to a desired goal. If possible, sit up straight or lean slightly forward. Try not to slouch. Hold the incentive spirometer in an upright position. INSTRUCTIONS FOR USE  Sit on the edge of your bed if possible, or sit up as far as you can in bed or on a chair. Hold the incentive spirometer in an upright position. Breathe out normally. Place the mouthpiece in your mouth and seal your lips tightly around it. Breathe in slowly and as deeply as possible, raising the piston or the ball toward the top of the column. Hold your breath for 3-5 seconds or for as long as possible. Allow the piston or ball to fall to the bottom of the column. Remove the mouthpiece from your mouth and breathe out normally. Rest for a few seconds and repeat Steps 1 through 7 at least 10 times every 1-2 hours when you are awake. Take your time and take a few normal breaths between deep breaths. The spirometer may include an indicator to show your best effort. Use the  indicator as a goal to work toward during each repetition. After each set of 10 deep breaths, practice coughing to be sure your lungs are clear. If you have an incision (the cut made at the time of surgery), support your incision when coughing by placing a pillow or rolled  up towels firmly against it. Once you are able to get out of bed, walk around indoors and cough well. You may stop using the incentive spirometer when instructed by your caregiver.  RISKS AND COMPLICATIONS Take your time so you do not get dizzy or light-headed. If you are in pain, you may need to take or ask for pain medication before doing incentive spirometry. It is harder to take a deep breath if you are having pain. AFTER USE Rest and breathe slowly and easily. It can be helpful to keep track of a log of your progress. Your caregiver can provide you with a simple table to help with this. If you are using the spirometer at home, follow these instructions: SEEK MEDICAL CARE IF:  You are having difficultly using the spirometer. You have trouble using the spirometer as often as instructed. Your pain medication is not giving enough relief while using the spirometer. You develop fever of 100.5 F (38.1 C) or higher.                                                                                                    SEEK IMMEDIATE MEDICAL CARE IF:  You cough up bloody sputum that had not been present before. You develop fever of 102 F (38.9 C) or greater. You develop worsening pain at or near the incision site. MAKE SURE YOU:  Understand these instructions. Will watch your condition. Will get help right away if you are not doing well or get worse. Document Released: 12/24/2006 Document Revised: 11/05/2011 Document Reviewed: 02/24/2007 [facility] Patient Information 2014 [place], [place].

## 2024-03-16 ENCOUNTER — Other Ambulatory Visit: Payer: Self-pay

## 2024-03-16 ENCOUNTER — Encounter (HOSPITAL_COMMUNITY): Payer: Self-pay

## 2024-03-16 ENCOUNTER — Encounter (HOSPITAL_COMMUNITY)
Admission: RE | Admit: 2024-03-16 | Discharge: 2024-03-16 | Disposition: A | Discharge status: Home / Self Care | Source: Ambulatory Visit | Attending: Orthopedic Surgery

## 2024-03-16 VITALS — BP 116/62 | HR 62 | Temp 98.5°F | Resp 20 | Ht 64.0 in | Wt 171.0 lb

## 2024-03-16 DIAGNOSIS — I444 Left anterior fascicular block: Secondary | ICD-10-CM | POA: Diagnosis not present

## 2024-03-16 DIAGNOSIS — T84091A Other mechanical complication of internal left hip prosthesis, initial encounter: Secondary | ICD-10-CM | POA: Diagnosis not present

## 2024-03-16 DIAGNOSIS — Z01818 Encounter for other preprocedural examination: Secondary | ICD-10-CM | POA: Diagnosis present

## 2024-03-16 DIAGNOSIS — I10 Essential (primary) hypertension: Secondary | ICD-10-CM

## 2024-03-16 DIAGNOSIS — Y792 Prosthetic and other implants, materials and accessory orthopedic devices associated with adverse incidents: Secondary | ICD-10-CM | POA: Insufficient documentation

## 2024-03-16 DIAGNOSIS — I129 Hypertensive chronic kidney disease with stage 1 through stage 4 chronic kidney disease, or unspecified chronic kidney disease: Secondary | ICD-10-CM | POA: Diagnosis not present

## 2024-03-16 HISTORY — DX: Cardiac arrhythmia, unspecified: I49.9

## 2024-03-16 HISTORY — DX: Chronic kidney disease, unspecified: N18.9

## 2024-03-16 HISTORY — DX: Bipolar disorder, unspecified: F31.9

## 2024-03-16 LAB — CBC
HCT: 37.9 % (ref 36.0–46.0)
Hemoglobin: 11.9 g/dL — ABNORMAL LOW (ref 12.0–15.0)
MCH: 30.5 pg (ref 26.0–34.0)
MCHC: 31.4 g/dL (ref 30.0–36.0)
MCV: 97.2 fL (ref 80.0–100.0)
Platelets: 261 K/uL (ref 150–400)
RBC: 3.9 MIL/uL (ref 3.87–5.11)
RDW: 12 % (ref 11.5–15.5)
WBC: 6.6 K/uL (ref 4.0–10.5)
nRBC: 0 % (ref 0.0–0.2)

## 2024-03-16 LAB — BASIC METABOLIC PANEL WITH GFR
Anion gap: 7 (ref 5–15)
BUN: 28 mg/dL — ABNORMAL HIGH (ref 8–23)
CO2: 27 mmol/L (ref 22–32)
Calcium: 9.7 mg/dL (ref 8.9–10.3)
Chloride: 106 mmol/L (ref 98–111)
Creatinine, Ser: 0.98 mg/dL (ref 0.44–1.00)
GFR, Estimated: >60 mL/min (ref >60)
Glucose, Bld: 100 mg/dL — ABNORMAL HIGH (ref 70–99)
Potassium: 3.7 mmol/L (ref 3.5–5.1)
Sodium: 140 mmol/L (ref 135–145)

## 2024-03-17 ENCOUNTER — Ambulatory Visit: Payer: Self-pay | Admitting: Student

## 2024-03-17 LAB — SURGICAL PCR SCREEN
MRSA, PCR: NEGATIVE
Staphylococcus aureus: POSITIVE — AB

## 2024-03-17 NOTE — Progress Notes (Signed)
 Patient's PCR screen is positive for STAPH. Appropriate notes have been placed on the patient's chart. This note has been routed to Dr.Swinteck and Valery Potters PA for review. The Patient's surgery is currently scheduled for: 03-26-2024 at [facility]  Shawnee Aloe, BSN, CVRN-BC   Pre-Surgical Testing Nurse [facility]  [phone]

## 2024-03-18 ENCOUNTER — Encounter (HOSPITAL_COMMUNITY): Payer: Self-pay

## 2024-03-18 NOTE — Anesthesia Preprocedure Evaluation (Addendum)
 Anesthesia Evaluation  Patient identified by MRN, date of birth, ID band Patient awake    Reviewed: Allergy & Precautions, NPO status , Patient's Chart, lab work & pertinent test results  Airway Mallampati: II  TM Distance: >3 FB Neck ROM: Full    Dental  (+) Partial Lower, Upper Dentures   Pulmonary sleep apnea (no CPAP) , former smoker   Pulmonary exam normal breath sounds clear to auscultation       Cardiovascular hypertension, Pt. on medications Normal cardiovascular exam Rhythm:Regular Rate:Normal  CV: Lexiscan  Tetrofosmin stress test 03/07/2020: Lexiscan  nuclear stress test performed using 1-day protocol. Normal myocardial perfusion. Stress LVEF 63%. Low risk study,   Echocardiogram 02/18/2020: Normal LV systolic function with visual EF 55-60%. Left ventricle cavity is normal in size. Normal global wall motion. Doppler evidence of grade II (pseudonormal) diastolic dysfunction, normal LAP. Calculated EF 55%. Left atrial cavity is severely dilated. Mild (Grade I) aortic regurgitation. Mild (Grade I) mitral regurgitation. Mild tricuspid regurgitation. No evidence of pulmonary hypertension. No prior study for comparison.    Neuro/Psych  Headaches PSYCHIATRIC DISORDERS Anxiety Depression Bipolar Disorder      GI/Hepatic negative GI ROS, Neg liver ROS,,,  Endo/Other   Hyperthyroidism   Renal/GU Renal InsufficiencyRenal disease  negative genitourinary   Musculoskeletal  (+) Arthritis ,    Abdominal   Peds  Hematology negative hematology ROS (+)   Anesthesia Other Findings   Reproductive/Obstetrics                              Anesthesia Physical Anesthesia Plan  ASA: 3  Anesthesia Plan: General   Post-op Pain Management: Tylenol  PO (pre-op)*   Induction:   PONV Risk Score and Plan: 3 and Treatment may vary due to age or medical condition, Ondansetron  and  Dexamethasone   Airway Management Planned: Oral ETT  Additional Equipment:   Intra-op Plan:   Post-operative Plan: Extubation in OR  Informed Consent: I have reviewed the patients History and Physical, chart, labs and discussed the procedure including the risks, benefits and alternatives for the proposed anesthesia with the patient or authorized representative who has indicated his/her understanding and acceptance.     Dental advisory given  Plan Discussed with: CRNA  Anesthesia Plan Comments: (See PAT note from 7/21  Plan for GA 2/2 length of procedure. )         Anesthesia Quick Evaluation

## 2024-03-18 NOTE — Progress Notes (Signed)
 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 PMH of former smoking, HTN, OSA (no CPAP), migraine, hyperthyroid, CKD, depression, bipolar d/o.  Seen by PCP on 11/13/23. All issues stable at that visit. She previously was on methimazole  for hyperthyroid but stopped due to cost. Last TSH in Sept 2024 was in the normal range. BP controlled. Medical clearance scanned in media on 03/05/24.  Pt had evaluation by Cardiology in 2021 for bradycardia and chest pain. Had stress test which was normal and echo showed normal LVEF with mild AI, mild MR  VS: BP 116/62 Comment: right arm sitting  Pulse 62   Temp 36.9 C (Oral)   Resp 20   Ht 5' 4 (1.626 m)   Wt 77.6 kg   SpO2 98%   BMI 29.35 kg/m   PROVIDERS: PCP -  Heather Level, FNP    LABS: Labs reviewed: Acceptable for surgery. Mild anemia (all labs ordered are listed, but only abnormal results are displayed)  Labs Reviewed  SURGICAL PCR SCREEN - Abnormal; Notable for the following components:      Result Value   Staphylococcus aureus POSITIVE (*)    All other components within normal limits  BASIC METABOLIC PANEL WITH GFR - Abnormal; Notable for the following components:   Glucose, Bld 100 (*)    BUN 28 (*)    All other components within normal limits  CBC - Abnormal; Notable for the following components:   Hemoglobin 11.9 (*)    All other components within normal limits  TYPE AND SCREEN     IMAGES:   EKG 03/16/24:  Normal sinus rhythm, rate 60 Left anterior fascicular block Moderate  voltage criteria for LVH, may be normal variant ( R in aVL , Cornell product ) Abnormal ECG\  CV: Lexiscan  Tetrofosmin stress test 03/07/2020: Lexiscan  nuclear stress test performed using 1-day protocol. Normal myocardial perfusion. Stress LVEF 63%. Low risk study,  Echocardiogram 02/18/2020: Normal LV systolic function with visual EF 55-60%. Left ventricle cavity is normal in size. Normal global wall motion. Doppler evidence of grade II (pseudonormal) diastolic dysfunction, normal LAP. Calculated EF 55%. Left atrial cavity is severely dilated. Mild (Grade I) aortic regurgitation. Mild (Grade I) mitral regurgitation. Mild tricuspid regurgitation. No evidence of pulmonary hypertension. No prior study for comparison.  Past Medical History:  Diagnosis Date   Anxiety    Arthritis    Bipolar disorder (HCC)    Bradycardia    Chronic kidney disease    CKD2   Depression    Dysrhythmia    Bradycardia,   Hypertension    Hyperthyroidism    Migraine    Sleep apnea    No CPAP   Thyroid  disease    hyperthyroid    Past Surgical History:  Procedure Laterality Date   ABDOMINAL HYSTERECTOMY     ABLATION SAPHENOUS VEIN W/ RFA Right    BLADDER SURGERY     Bladder Tack   BUNIONECTOMY Bilateral    HAMMER TOE SURGERY Bilateral    JOINT REPLACEMENT Left 2008   Left  Hip   ROTATOR CUFF REPAIR Right    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   Procedure: TOTAL HIP ARTHROPLASTY ANTERIOR APPROACH;  Surgeon: Heather Rogue, MD;  Location: [facility];  Service: Orthopedics;  Laterality: Right;    MEDICATIONS:  alendronate (FOSAMAX) 70 MG tablet   ALPRAZolam (XANAX) 0.5 MG tablet   amLODipine (NORVASC) 5 MG tablet   Calcium-Magnesium -Vitamin D (CALCIUM 1200+D3 PO)   Eszopiclone  3 MG TABS   hydrochlorothiazide (HYDRODIURIL) 25 MG tablet   LAMICTAL 200 MG tablet   losartan  (COZAAR ) 100 MG tablet   Magnesium  400 MG TABS   QUEtiapine  (SEROQUEL ) 300 MG tablet   QUEtiapine  (SEROQUEL ) 50 MG  tablet   rizatriptan (MAXALT) 10 MG tablet   topiramate  (TOPAMAX ) 200 MG tablet   traMADol (ULTRAM) 50 MG tablet   No current facility-administered medications for this encounter.   Burnard Heather Gray Heather Gray/[facility] Short Stay/Anesthesiology [facility] Phone [phone] 03/18/2024 8:53 AM

## 2024-03-24 ENCOUNTER — Ambulatory Visit: Payer: Self-pay | Admitting: Student

## 2024-03-24 NOTE — H&P (Signed)
 TOTAL HIP REVISION ADMISSION H&P  Patient is admitted for left revision total hip arthroplasty.  Subjective:  Chief Complaint: left hip pain  HPI: Heather Gray, 73 y.o. female, has a history of pain and functional disability in the left hip due to arthritis and patient has failed non-surgical conservative treatments for greater than 12 weeks to include NSAID's and/or analgesics, flexibility and strengthening excercises, supervised PT with diminished ADL's post treatment, use of assistive devices, and activity modification. The indications for the revision total hip arthroplasty are bearing surface wear leading to  symptomatic synovitis and corrosion and adverse local tissue reaction..  Onset of symptoms was gradual starting 1 years ago with rapidlly worsening course since that time.  Prior procedures on the left hip include arthroplasty.  Patient currently rates pain in the left hip at 10 out of 10 with activity.  There is night pain, worsening of pain with activity and weight bearing, trendelenberg gait, pain that interfers with activities of daily living, pain with passive range of motion, and joint swelling. 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 Periprosthetic fluid collection extending into the G max muscle just lateral to the sciatic nerve. Fluid collection measures roughly 4.1 cm AP x 5.8 cm transversely by 10.8 cm in length by imaging studies.  This condition presents safety issues increasing the risk of falls.  There is no current active infection.  Patient Active Problem List   Diagnosis Date Noted   Primary osteoarthritis of right hip 06/29/2020   Bradycardia    Bipolar I disorder, most recent episode depressed (HCC) 03/04/2014   Anxiety state 03/04/2014   Past Medical History:  Diagnosis Date   Anxiety    Arthritis    Bipolar disorder (HCC)    Bradycardia    Chronic kidney disease    CKD2   Depression    Dysrhythmia     Bradycardia,   Hypertension    Hyperthyroidism    Migraine    Sleep apnea    No CPAP   Thyroid  disease    hyperthyroid    Past Surgical History:  Procedure Laterality Date   ABDOMINAL HYSTERECTOMY     ABLATION SAPHENOUS VEIN W/ RFA Right    BLADDER SURGERY     Bladder Tack   BUNIONECTOMY Bilateral    HAMMER TOE SURGERY Bilateral    JOINT REPLACEMENT Left 2008   Left Hip   ROTATOR CUFF REPAIR Right    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   Procedure: TOTAL HIP ARTHROPLASTY ANTERIOR APPROACH;  Surgeon: Fidel Rogue, MD;  Location: [facility];  Service: Orthopedics;  Laterality: Right;    Current Outpatient Medications  Medication Sig Dispense Refill Last Dose/Taking   alendronate (FOSAMAX) 70 MG tablet Take 70 mg by mouth every Monday. Take with a full glass of water  on an empty stomach.      ALPRAZolam  (XANAX ) 0.5 MG tablet Take 0.75 mg by mouth at bedtime.      amLODipine (NORVASC) 5 MG tablet Take 5 mg by mouth daily.      Calcium-Magnesium -Vitamin D (CALCIUM 1200+D3 PO) Take 1 tablet by mouth daily.      Eszopiclone  3 MG TABS TAKE 1 TABLET BY MOUTH AT BEDTIME TAKE IMMEDIATELY BEFORE BEDTIME (Patient not taking: Reported on 03/12/2024) 30 tablet 2    hydrochlorothiazide (HYDRODIURIL) 25 MG tablet Take 25 mg by mouth daily.      LAMICTAL  200 MG tablet Take 200 mg by mouth every morning.  losartan  (COZAAR ) 100 MG tablet Take 100 mg by mouth daily.      Magnesium  400 MG TABS Take 400 mg by mouth at bedtime.      QUEtiapine  (SEROQUEL ) 300 MG tablet Take 300 mg by mouth at bedtime.      QUEtiapine  (SEROQUEL ) 50 MG tablet Take 50 mg by mouth at bedtime.      rizatriptan (MAXALT) 10 MG tablet Take 10 mg by mouth as needed for migraine. May repeat in 2 hours if needed      topiramate  (TOPAMAX ) 200 MG tablet Take 200 mg by mouth at bedtime.      traMADol (ULTRAM) 50 MG tablet Take 25 mg by mouth every 6 (six) hours as needed for moderate pain (pain score 4-6).      No current  facility-administered medications for this visit.   Allergies  Allergen Reactions   Tetracyclines & Related Shortness Of Breath and Palpitations   Sulfa Antibiotics Other (See Comments)    Leg cramping    Social History   Tobacco Use   Smoking status: Former    Current packs/day: 0.00    Average packs/day: 2.0 packs/day for 30.0 years (60.0 ttl pk-yrs)    Types: Cigarettes    Start date: 10/16/1958    Quit date: 10/16/1988    Years since quitting: 35.4   Smokeless tobacco: Never  Substance Use Topics   Alcohol  use: Yes    Alcohol /week: 0.0 standard drinks of alcohol     Comment: Ocassionally every six months or so    Family History  Problem Relation Age of Onset   Alcohol  abuse Mother    Alcohol  abuse Father    Schizophrenia Sister    Alcohol  abuse Sister       Review of Systems  Musculoskeletal:  Positive for arthralgias and gait problem.  All other systems reviewed and are negative.   Objective:  Physical Exam Constitutional:      Appearance: Normal appearance.  HENT:     Head: Normocephalic and atraumatic.     Nose: Nose normal.     Mouth/Throat:     Mouth: Mucous membranes are moist.     Pharynx: Oropharynx is clear.  Eyes:     Conjunctiva/sclera: Conjunctivae normal.  Cardiovascular:     Rate and Rhythm: Normal rate and regular rhythm.     Pulses: Normal pulses.     Heart sounds: Normal heart sounds.  Pulmonary:     Effort: Pulmonary effort is normal.     Breath sounds: Normal breath sounds.  Abdominal:     General: Abdomen is flat.     Palpations: Abdomen is soft.  Genitourinary:    Comments: deferred Musculoskeletal:     Cervical back: Normal range of motion and neck supple.     Comments: Examination of the left hip reveals a healed posterior incision. No significant trochanteric tenderness to palpation. She has severe pain with manipulation of the hip. She has severe pain with resisted flexion and resisted abduction.  Distally, there is no  focal motor or sensory deficit. She has palpable pedal pulses. No significant pedal edema. No calf tenderness to palpation.  Skin:    General: Skin is warm.     Capillary Refill: Capillary refill takes less than 2 seconds.  Neurological:     General: No focal deficit present.     Mental Status: She is alert and oriented to person, place, and time.  Psychiatric:        Mood and Affect: Mood  normal.        Behavior: Behavior normal.        Thought Content: Thought content normal.        Judgment: Judgment normal.     Vital signs in last 24 hours: @VSRANGES @   Labs:   Estimated body mass index is 29.35 kg/m as calculated from the following:   Height as of 03/16/24: 5' 4 (1.626 m).   Weight as of 03/16/24: 77.6 kg.  Imaging Review:  Plain radiographs demonstrate severe degenerative joint disease of the left hip(s). The MRI showed periarticular fluid collection consistent with adverse local tissue reaction.The bone quality appears to be adequate for age and reported activity level.    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  The patient history, physical examination, clinical judgement of the provider and imaging studies are consistent with end stage degenerative joint disease of the left hip(s), previous total hip arthroplasty. Revision total hip arthroplasty is deemed medically necessary. The treatment options including medical management, injection therapy, arthroscopy and arthroplasty were discussed at length. The risks and benefits of total hip arthroplasty were presented and reviewed. The risks due to aseptic loosening, infection, stiffness, dislocation/subluxation,  thromboembolic complications and other imponderables were discussed.  The patient acknowledged the explanation, agreed to proceed with the plan and consent was signed. Patient is being  admitted for inpatient treatment for surgery, pain control, PT, OT, prophylactic antibiotics, VTE prophylaxis, progressive ambulation and ADL's and discharge planning. The patient is planning to be discharged home with HEP after a 2-3 night stay.   Therapy Plans: HEP.  Disposition: Home with Dickey Arlington and friends Planned DVT Prophylaxis: aspirin  81mg  BID DME needed: has rolling walker, scooter, wheelchair, potty chair, lift chair.  PCP: Cleared.  TXA: IV Allergies:  - Sulfa antibiotics - leg cramping.  - Tetracycline - heart racing. Anesthesia Concerns: None.  BMI: 28.5 Last HgbA1c: Not diabetic. Other: - Left superior and inferior sacral insufficiency fractures. - Hold alendronate 6-12 months.  - Protected weightbearing up to 12 weeks.  - Hydrocodone , zofran , methocarbamol , has aleve.  -  03/16/24: Hgb 11.9, K+ 3.7, Cr. 0.98. - Mupirocin .

## 2024-03-24 NOTE — H&P (View-Only) (Signed)
 TOTAL HIP REVISION ADMISSION H&P  Patient is admitted for left revision total hip arthroplasty.  Subjective:  Chief Complaint: left hip pain  HPI: Heather Gray, 73 y.o. female, has a history of pain and functional disability in the left hip due to arthritis and patient has failed non-surgical conservative treatments for greater than 12 weeks to include NSAID's and/or analgesics, flexibility and strengthening excercises, supervised PT with diminished ADL's post treatment, use of assistive devices, and activity modification. The indications for the revision total hip arthroplasty are bearing surface wear leading to  symptomatic synovitis and corrosion and adverse local tissue reaction..  Onset of symptoms was gradual starting 1 years ago with rapidlly worsening course since that time.  Prior procedures on the left hip include arthroplasty.  Patient currently rates pain in the left hip at 10 out of 10 with activity.  There is night pain, worsening of pain with activity and weight bearing, trendelenberg gait, pain that interfers with activities of daily living, pain with passive range of motion, and joint swelling. 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 Periprosthetic fluid collection extending into the G max muscle just lateral to the sciatic nerve. Fluid collection measures roughly 4.1 cm AP x 5.8 cm transversely by 10.8 cm in length by imaging studies.  This condition presents safety issues increasing the risk of falls.  There is no current active infection.  Patient Active Problem List   Diagnosis Date Noted   Primary osteoarthritis of right hip 06/29/2020   Bradycardia    Bipolar I disorder, most recent episode depressed (HCC) 03/04/2014   Anxiety state 03/04/2014   Past Medical History:  Diagnosis Date   Anxiety    Arthritis    Bipolar disorder (HCC)    Bradycardia    Chronic kidney disease    CKD2   Depression    Dysrhythmia     Bradycardia,   Hypertension    Hyperthyroidism    Migraine    Sleep apnea    No CPAP   Thyroid  disease    hyperthyroid    Past Surgical History:  Procedure Laterality Date   ABDOMINAL HYSTERECTOMY     ABLATION SAPHENOUS VEIN W/ RFA Right    BLADDER SURGERY     Bladder Tack   BUNIONECTOMY Bilateral    HAMMER TOE SURGERY Bilateral    JOINT REPLACEMENT Left 2008   Left Hip   ROTATOR CUFF REPAIR Right    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   Procedure: TOTAL HIP ARTHROPLASTY ANTERIOR APPROACH;  Surgeon: Fidel Rogue, MD;  Location: [facility];  Service: Orthopedics;  Laterality: Right;    Current Outpatient Medications  Medication Sig Dispense Refill Last Dose/Taking   alendronate (FOSAMAX) 70 MG tablet Take 70 mg by mouth every Monday. Take with a full glass of water  on an empty stomach.      ALPRAZolam  (XANAX ) 0.5 MG tablet Take 0.75 mg by mouth at bedtime.      amLODipine (NORVASC) 5 MG tablet Take 5 mg by mouth daily.      Calcium-Magnesium -Vitamin D (CALCIUM 1200+D3 PO) Take 1 tablet by mouth daily.      Eszopiclone  3 MG TABS TAKE 1 TABLET BY MOUTH AT BEDTIME TAKE IMMEDIATELY BEFORE BEDTIME (Patient not taking: Reported on 03/12/2024) 30 tablet 2    hydrochlorothiazide (HYDRODIURIL) 25 MG tablet Take 25 mg by mouth daily.      LAMICTAL  200 MG tablet Take 200 mg by mouth every morning.  losartan  (COZAAR ) 100 MG tablet Take 100 mg by mouth daily.      Magnesium  400 MG TABS Take 400 mg by mouth at bedtime.      QUEtiapine  (SEROQUEL ) 300 MG tablet Take 300 mg by mouth at bedtime.      QUEtiapine  (SEROQUEL ) 50 MG tablet Take 50 mg by mouth at bedtime.      rizatriptan (MAXALT) 10 MG tablet Take 10 mg by mouth as needed for migraine. May repeat in 2 hours if needed      topiramate  (TOPAMAX ) 200 MG tablet Take 200 mg by mouth at bedtime.      traMADol (ULTRAM) 50 MG tablet Take 25 mg by mouth every 6 (six) hours as needed for moderate pain (pain score 4-6).      No current  facility-administered medications for this visit.   Allergies  Allergen Reactions   Tetracyclines & Related Shortness Of Breath and Palpitations   Sulfa Antibiotics Other (See Comments)    Leg cramping    Social History   Tobacco Use   Smoking status: Former    Current packs/day: 0.00    Average packs/day: 2.0 packs/day for 30.0 years (60.0 ttl pk-yrs)    Types: Cigarettes    Start date: 10/16/1958    Quit date: 10/16/1988    Years since quitting: 35.4   Smokeless tobacco: Never  Substance Use Topics   Alcohol  use: Yes    Alcohol /week: 0.0 standard drinks of alcohol     Comment: Ocassionally every six months or so    Family History  Problem Relation Age of Onset   Alcohol  abuse Mother    Alcohol  abuse Father    Schizophrenia Sister    Alcohol  abuse Sister       Review of Systems  Musculoskeletal:  Positive for arthralgias and gait problem.  All other systems reviewed and are negative.   Objective:  Physical Exam Constitutional:      Appearance: Normal appearance.  HENT:     Head: Normocephalic and atraumatic.     Nose: Nose normal.     Mouth/Throat:     Mouth: Mucous membranes are moist.     Pharynx: Oropharynx is clear.  Eyes:     Conjunctiva/sclera: Conjunctivae normal.  Cardiovascular:     Rate and Rhythm: Normal rate and regular rhythm.     Pulses: Normal pulses.     Heart sounds: Normal heart sounds.  Pulmonary:     Effort: Pulmonary effort is normal.     Breath sounds: Normal breath sounds.  Abdominal:     General: Abdomen is flat.     Palpations: Abdomen is soft.  Genitourinary:    Comments: deferred Musculoskeletal:     Cervical back: Normal range of motion and neck supple.     Comments: Examination of the left hip reveals a healed posterior incision. No significant trochanteric tenderness to palpation. She has severe pain with manipulation of the hip. She has severe pain with resisted flexion and resisted abduction.  Distally, there is no  focal motor or sensory deficit. She has palpable pedal pulses. No significant pedal edema. No calf tenderness to palpation.  Skin:    General: Skin is warm.     Capillary Refill: Capillary refill takes less than 2 seconds.  Neurological:     General: No focal deficit present.     Mental Status: She is alert and oriented to person, place, and time.  Psychiatric:        Mood and Affect: Mood  normal.        Behavior: Behavior normal.        Thought Content: Thought content normal.        Judgment: Judgment normal.     Vital signs in last 24 hours: @VSRANGES @   Labs:   Estimated body mass index is 29.35 kg/m as calculated from the following:   Height as of 03/16/24: 5' 4 (1.626 m).   Weight as of 03/16/24: 77.6 kg.  Imaging Review:  Plain radiographs demonstrate severe degenerative joint disease of the left hip(s). The MRI showed periarticular fluid collection consistent with adverse local tissue reaction.The bone quality appears to be adequate for age and reported activity level.    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  The patient history, physical examination, clinical judgement of the provider and imaging studies are consistent with end stage degenerative joint disease of the left hip(s), previous total hip arthroplasty. Revision total hip arthroplasty is deemed medically necessary. The treatment options including medical management, injection therapy, arthroscopy and arthroplasty were discussed at length. The risks and benefits of total hip arthroplasty were presented and reviewed. The risks due to aseptic loosening, infection, stiffness, dislocation/subluxation,  thromboembolic complications and other imponderables were discussed.  The patient acknowledged the explanation, agreed to proceed with the plan and consent was signed. Patient is being  admitted for inpatient treatment for surgery, pain control, PT, OT, prophylactic antibiotics, VTE prophylaxis, progressive ambulation and ADL's and discharge planning. The patient is planning to be discharged home with HEP after a 2-3 night stay.   Therapy Plans: HEP.  Disposition: Home with Dickey Arlington and friends Planned DVT Prophylaxis: aspirin  81mg  BID DME needed: has rolling walker, scooter, wheelchair, potty chair, lift chair.  PCP: Cleared.  TXA: IV Allergies:  - Sulfa antibiotics - leg cramping.  - Tetracycline - heart racing. Anesthesia Concerns: None.  BMI: 28.5 Last HgbA1c: Not diabetic. Other: - Left superior and inferior sacral insufficiency fractures. - Hold alendronate 6-12 months.  - Protected weightbearing up to 12 weeks.  - Hydrocodone , zofran , methocarbamol , has aleve.  -  03/16/24: Hgb 11.9, K+ 3.7, Cr. 0.98. - Mupirocin .

## 2024-03-26 ENCOUNTER — Inpatient Hospital Stay (HOSPITAL_COMMUNITY)

## 2024-03-26 ENCOUNTER — Inpatient Hospital Stay (HOSPITAL_COMMUNITY): Payer: Self-pay | Admitting: Anesthesiology

## 2024-03-26 ENCOUNTER — Inpatient Hospital Stay (HOSPITAL_COMMUNITY): Payer: Self-pay | Admitting: Medical

## 2024-03-26 ENCOUNTER — Encounter (HOSPITAL_COMMUNITY): Payer: Self-pay | Admitting: Orthopedic Surgery

## 2024-03-26 ENCOUNTER — Encounter (HOSPITAL_COMMUNITY)
Admission: RE | Disposition: A | Discharge status: Home / Self Care | Payer: Self-pay | Source: Ambulatory Visit | Attending: Orthopedic Surgery

## 2024-03-26 ENCOUNTER — Other Ambulatory Visit: Payer: Self-pay

## 2024-03-26 ENCOUNTER — Inpatient Hospital Stay (HOSPITAL_COMMUNITY)
Admission: RE | Admit: 2024-03-26 | Discharge: 2024-03-31 | DRG: 467 | Disposition: A | Discharge status: Home / Self Care | Source: Ambulatory Visit | Attending: Orthopedic Surgery | Admitting: Orthopedic Surgery

## 2024-03-26 DIAGNOSIS — G43909 Migraine, unspecified, not intractable, without status migrainosus: Secondary | ICD-10-CM | POA: Diagnosis present

## 2024-03-26 DIAGNOSIS — Z811 Family history of alcohol abuse and dependence: Secondary | ICD-10-CM

## 2024-03-26 DIAGNOSIS — Z96641 Presence of right artificial hip joint: Secondary | ICD-10-CM | POA: Diagnosis present

## 2024-03-26 DIAGNOSIS — Z7983 Long term (current) use of bisphosphonates: Secondary | ICD-10-CM | POA: Diagnosis not present

## 2024-03-26 DIAGNOSIS — F418 Other specified anxiety disorders: Secondary | ICD-10-CM

## 2024-03-26 DIAGNOSIS — Z87891 Personal history of nicotine dependence: Secondary | ICD-10-CM | POA: Diagnosis not present

## 2024-03-26 DIAGNOSIS — Z881 Allergy status to other antibiotic agents status: Secondary | ICD-10-CM | POA: Diagnosis not present

## 2024-03-26 DIAGNOSIS — F319 Bipolar disorder, unspecified: Secondary | ICD-10-CM | POA: Diagnosis present

## 2024-03-26 DIAGNOSIS — Z882 Allergy status to sulfonamides status: Secondary | ICD-10-CM | POA: Diagnosis not present

## 2024-03-26 DIAGNOSIS — T84091A Other mechanical complication of internal left hip prosthesis, initial encounter: Secondary | ICD-10-CM

## 2024-03-26 DIAGNOSIS — Y792 Prosthetic and other implants, materials and accessory orthopedic devices associated with adverse incidents: Secondary | ICD-10-CM | POA: Diagnosis present

## 2024-03-26 DIAGNOSIS — G4730 Sleep apnea, unspecified: Secondary | ICD-10-CM | POA: Diagnosis present

## 2024-03-26 DIAGNOSIS — N182 Chronic kidney disease, stage 2 (mild): Secondary | ICD-10-CM | POA: Diagnosis present

## 2024-03-26 DIAGNOSIS — Z79899 Other long term (current) drug therapy: Secondary | ICD-10-CM | POA: Diagnosis not present

## 2024-03-26 DIAGNOSIS — I129 Hypertensive chronic kidney disease with stage 1 through stage 4 chronic kidney disease, or unspecified chronic kidney disease: Secondary | ICD-10-CM | POA: Diagnosis present

## 2024-03-26 DIAGNOSIS — I10 Essential (primary) hypertension: Secondary | ICD-10-CM

## 2024-03-26 DIAGNOSIS — T84018A Broken internal joint prosthesis, other site, initial encounter: Principal | ICD-10-CM | POA: Diagnosis present

## 2024-03-26 DIAGNOSIS — D62 Acute posthemorrhagic anemia: Secondary | ICD-10-CM | POA: Diagnosis not present

## 2024-03-26 DIAGNOSIS — Z96642 Presence of left artificial hip joint: Secondary | ICD-10-CM | POA: Diagnosis present

## 2024-03-26 DIAGNOSIS — Z818 Family history of other mental and behavioral disorders: Secondary | ICD-10-CM

## 2024-03-26 HISTORY — PX: ANTERIOR HIP REVISION: SHX6527

## 2024-03-26 LAB — PREPARE RBC (CROSSMATCH)

## 2024-03-26 SURGERY — REVISION, TOTAL ARTHROPLASTY, HIP, ANTERIOR APPROACH
Anesthesia: General | Site: Hip | Laterality: Left

## 2024-03-26 MED ORDER — HYDROMORPHONE HCL 1 MG/ML IJ SOLN
INTRAMUSCULAR | Status: DC | PRN
  Administered 2024-03-26: .5 mg via INTRAVENOUS
  Administered 2024-03-26: .25 mg via INTRAVENOUS

## 2024-03-26 MED ORDER — LACTATED RINGERS IV SOLN: INTRAVENOUS | Status: DC

## 2024-03-26 MED ORDER — ISOPROPYL ALCOHOL 70 % SOLN
Status: DC | PRN
  Administered 2024-03-26: 1 via TOPICAL

## 2024-03-26 MED ORDER — SUGAMMADEX SODIUM 200 MG/2ML IV SOLN
INTRAVENOUS | Status: AC
  Filled 2024-03-26: qty 2

## 2024-03-26 MED ORDER — POLYETHYLENE GLYCOL 3350 17 G PO PACK
17.0000 g | PACK | Freq: Every day | ORAL | Status: DC | PRN
Start: 2024-03-26 — End: 2024-03-31
  Administered 2024-03-29 – 2024-03-30 (×2): 17 g via ORAL
  Filled 2024-03-26 (×2): qty 1

## 2024-03-26 MED ORDER — MUPIROCIN 2 % EX OINT: 1.0000 | TOPICAL_OINTMENT | Freq: Two times a day (BID) | CUTANEOUS | 0 refills | Status: AC

## 2024-03-26 MED ORDER — OXYCODONE HCL 5 MG PO TABS: 5.0000 mg | ORAL_TABLET | Freq: Once | ORAL | Status: DC | PRN

## 2024-03-26 MED ORDER — CHLORHEXIDINE GLUCONATE 4 % EX SOLN: 1.0000 | CUTANEOUS | 1 refills | Status: DC

## 2024-03-26 MED ORDER — KETOROLAC TROMETHAMINE 15 MG/ML IJ SOLN
7.5000 mg | Freq: Four times a day (QID) | INTRAMUSCULAR | Status: AC
  Administered 2024-03-26 – 2024-03-27 (×4): 7.5 mg via INTRAVENOUS
  Filled 2024-03-26 (×4): qty 1

## 2024-03-26 MED ORDER — PHENYLEPHRINE 80 MCG/ML (10ML) SYRINGE FOR IV PUSH (FOR BLOOD PRESSURE SUPPORT)
PREFILLED_SYRINGE | INTRAVENOUS | Status: AC
  Filled 2024-03-26: qty 10

## 2024-03-26 MED ORDER — PROPOFOL 10 MG/ML IV BOLUS
INTRAVENOUS | Status: DC | PRN
  Administered 2024-03-26: 20 mg via INTRAVENOUS
  Administered 2024-03-26: 70 mg via INTRAVENOUS

## 2024-03-26 MED ORDER — PHENYLEPHRINE HCL (PRESSORS) 10 MG/ML IV SOLN
INTRAVENOUS | Status: AC
  Filled 2024-03-26: qty 1

## 2024-03-26 MED ORDER — VANCOMYCIN HCL IN DEXTROSE 1-5 GM/200ML-% IV SOLN
INTRAVENOUS | Status: AC
  Filled 2024-03-26: qty 200

## 2024-03-26 MED ORDER — ONDANSETRON HCL 4 MG/2ML IJ SOLN
INTRAMUSCULAR | Status: DC | PRN
  Administered 2024-03-26: 4 mg via INTRAVENOUS

## 2024-03-26 MED ORDER — MUPIROCIN 2 % EX OINT
TOPICAL_OINTMENT | Freq: Two times a day (BID) | CUTANEOUS | Status: AC
  Administered 2024-03-26 – 2024-03-27 (×2): 1 via NASAL
  Filled 2024-03-26: qty 22

## 2024-03-26 MED ORDER — FENTANYL CITRATE (PF) 100 MCG/2ML IJ SOLN
INTRAMUSCULAR | Status: DC | PRN
  Administered 2024-03-26 (×2): 25 ug via INTRAVENOUS
  Administered 2024-03-26: 50 ug via INTRAVENOUS

## 2024-03-26 MED ORDER — ORAL CARE MOUTH RINSE: 15.0000 mL | Freq: Once | OROMUCOSAL | Status: AC

## 2024-03-26 MED ORDER — PANTOPRAZOLE SODIUM 40 MG PO TBEC
40.0000 mg | DELAYED_RELEASE_TABLET | Freq: Every day | ORAL | Status: DC
  Administered 2024-03-26 – 2024-03-31 (×6): 40 mg via ORAL
  Filled 2024-03-26 (×6): qty 1

## 2024-03-26 MED ORDER — SODIUM CHLORIDE 0.9 % IV SOLN: INTRAVENOUS | Status: DC

## 2024-03-26 MED ORDER — METOCLOPRAMIDE HCL 5 MG/ML IJ SOLN: 5.0000 mg | Freq: Three times a day (TID) | INTRAMUSCULAR | Status: DC | PRN

## 2024-03-26 MED ORDER — ALPRAZOLAM 0.5 MG PO TABS
0.7500 mg | ORAL_TABLET | Freq: Every evening | ORAL | Status: DC | PRN
  Administered 2024-03-27 – 2024-03-30 (×3): 0.75 mg via ORAL
  Filled 2024-03-26 (×3): qty 1

## 2024-03-26 MED ORDER — ACETAMINOPHEN 500 MG PO TABS
1000.0000 mg | ORAL_TABLET | Freq: Once | ORAL | Status: AC
  Administered 2024-03-26: 1000 mg via ORAL
  Filled 2024-03-26: qty 2

## 2024-03-26 MED ORDER — MORPHINE SULFATE (PF) 2 MG/ML IV SOLN: 0.5000 mg | INTRAVENOUS | Status: DC | PRN

## 2024-03-26 MED ORDER — FENTANYL CITRATE PF 50 MCG/ML IJ SOSY
25.0000 ug | PREFILLED_SYRINGE | INTRAMUSCULAR | Status: DC | PRN
  Administered 2024-03-26: 25 ug via INTRAVENOUS

## 2024-03-26 MED ORDER — AMISULPRIDE (ANTIEMETIC) 5 MG/2ML IV SOLN
INTRAVENOUS | Status: AC
  Filled 2024-03-26: qty 2

## 2024-03-26 MED ORDER — METHOCARBAMOL 1000 MG/10ML IJ SOLN: 500.0000 mg | Freq: Four times a day (QID) | INTRAMUSCULAR | Status: DC | PRN

## 2024-03-26 MED ORDER — POVIDONE-IODINE 10 % EX SWAB: 2.0000 | Freq: Once | CUTANEOUS | Status: DC

## 2024-03-26 MED ORDER — LIDOCAINE HCL (PF) 2 % IJ SOLN
INTRAMUSCULAR | Status: AC
  Filled 2024-03-26: qty 5

## 2024-03-26 MED ORDER — MIDAZOLAM HCL 2 MG/2ML IJ SOLN
INTRAMUSCULAR | Status: AC
Start: 2024-03-26 — End: 2024-03-26
  Filled 2024-03-26: qty 2

## 2024-03-26 MED ORDER — MENTHOL 3 MG MT LOZG: 1.0000 | LOZENGE | OROMUCOSAL | Status: DC | PRN

## 2024-03-26 MED ORDER — ROCURONIUM BROMIDE 100 MG/10ML IV SOLN
INTRAVENOUS | Status: DC | PRN
  Administered 2024-03-26: 10 mg via INTRAVENOUS
  Administered 2024-03-26: 50 mg via INTRAVENOUS
  Administered 2024-03-26 (×3): 20 mg via INTRAVENOUS
  Administered 2024-03-26 (×2): 10 mg via INTRAVENOUS

## 2024-03-26 MED ORDER — QUETIAPINE FUMARATE 300 MG PO TABS
300.0000 mg | ORAL_TABLET | Freq: Every day | ORAL | Status: DC
  Administered 2024-03-26 – 2024-03-30 (×5): 300 mg via ORAL
  Filled 2024-03-26 (×5): qty 1

## 2024-03-26 MED ORDER — DEXAMETHASONE SODIUM PHOSPHATE 10 MG/ML IJ SOLN
INTRAMUSCULAR | Status: AC
  Filled 2024-03-26: qty 1

## 2024-03-26 MED ORDER — ONDANSETRON HCL 4 MG/2ML IJ SOLN
INTRAMUSCULAR | Status: AC
  Filled 2024-03-26: qty 2

## 2024-03-26 MED ORDER — BUPIVACAINE-EPINEPHRINE (PF) 0.25% -1:200000 IJ SOLN
INTRAMUSCULAR | Status: AC
  Filled 2024-03-26: qty 30

## 2024-03-26 MED ORDER — METHOCARBAMOL 500 MG PO TABS
500.0000 mg | ORAL_TABLET | Freq: Four times a day (QID) | ORAL | Status: DC | PRN
  Administered 2024-03-28 – 2024-03-31 (×4): 500 mg via ORAL
  Filled 2024-03-26 (×5): qty 1

## 2024-03-26 MED ORDER — ACETAMINOPHEN 500 MG PO TABS: 1000.0000 mg | ORAL_TABLET | Freq: Once | ORAL | Status: DC

## 2024-03-26 MED ORDER — QUETIAPINE FUMARATE 50 MG PO TABS
50.0000 mg | ORAL_TABLET | Freq: Every day | ORAL | Status: DC
  Administered 2024-03-26 – 2024-03-30 (×5): 50 mg via ORAL
  Filled 2024-03-26 (×5): qty 1

## 2024-03-26 MED ORDER — DIPHENHYDRAMINE HCL 12.5 MG/5ML PO ELIX: 12.5000 mg | ORAL_SOLUTION | ORAL | Status: DC | PRN

## 2024-03-26 MED ORDER — ALBUMIN HUMAN 5 % IV SOLN
INTRAVENOUS | Status: DC | PRN
Start: 2024-03-26 — End: 2024-03-26

## 2024-03-26 MED ORDER — PROPOFOL 10 MG/ML IV BOLUS
INTRAVENOUS | Status: AC
  Filled 2024-03-26: qty 20

## 2024-03-26 MED ORDER — STERILE WATER FOR IRRIGATION IR SOLN
Status: DC | PRN
  Administered 2024-03-26: 2000 mL

## 2024-03-26 MED ORDER — SENNA 8.6 MG PO TABS
1.0000 | ORAL_TABLET | Freq: Two times a day (BID) | ORAL | Status: DC
  Administered 2024-03-26 – 2024-03-31 (×10): 8.6 mg via ORAL
  Filled 2024-03-26 (×10): qty 1

## 2024-03-26 MED ORDER — ONDANSETRON HCL 4 MG PO TABS
4.0000 mg | ORAL_TABLET | Freq: Four times a day (QID) | ORAL | Status: DC | PRN
  Administered 2024-03-28: 4 mg via ORAL
  Filled 2024-03-26: qty 1

## 2024-03-26 MED ORDER — PHENYLEPHRINE HCL-NACL 20-0.9 MG/250ML-% IV SOLN
INTRAVENOUS | Status: DC | PRN
  Administered 2024-03-26: 10 ug/min via INTRAVENOUS

## 2024-03-26 MED ORDER — HYDROMORPHONE HCL 2 MG/ML IJ SOLN
INTRAMUSCULAR | Status: AC
  Filled 2024-03-26: qty 1

## 2024-03-26 MED ORDER — OXYCODONE HCL 5 MG/5ML PO SOLN: 5.0000 mg | Freq: Once | ORAL | Status: DC | PRN

## 2024-03-26 MED ORDER — ALUM & MAG HYDROXIDE-SIMETH 200-200-20 MG/5ML PO SUSP
30.0000 mL | ORAL | Status: DC | PRN
Start: 2024-03-26 — End: 2024-03-31

## 2024-03-26 MED ORDER — LIDOCAINE HCL (CARDIAC) PF 100 MG/5ML IV SOSY
PREFILLED_SYRINGE | INTRAVENOUS | Status: DC | PRN
  Administered 2024-03-26: 50 mg via INTRAVENOUS

## 2024-03-26 MED ORDER — CEFAZOLIN SODIUM-DEXTROSE 2-4 GM/100ML-% IV SOLN
2.0000 g | INTRAVENOUS | Status: AC
  Administered 2024-03-26: 2 g via INTRAVENOUS
  Filled 2024-03-26: qty 100

## 2024-03-26 MED ORDER — CEFAZOLIN SODIUM-DEXTROSE 2-4 GM/100ML-% IV SOLN
2.0000 g | Freq: Four times a day (QID) | INTRAVENOUS | Status: AC
  Administered 2024-03-26 – 2024-03-27 (×2): 2 g via INTRAVENOUS
  Filled 2024-03-26 (×2): qty 100

## 2024-03-26 MED ORDER — ROCURONIUM BROMIDE 10 MG/ML (PF) SYRINGE
PREFILLED_SYRINGE | INTRAVENOUS | Status: AC
  Filled 2024-03-26: qty 10

## 2024-03-26 MED ORDER — HYDROCODONE-ACETAMINOPHEN 5-325 MG PO TABS
1.0000 | ORAL_TABLET | ORAL | Status: DC | PRN
  Administered 2024-03-26: 1 via ORAL
  Administered 2024-03-27 (×2): 2 via ORAL
  Administered 2024-03-27: 1 via ORAL
  Administered 2024-03-28 – 2024-03-31 (×12): 2 via ORAL
  Filled 2024-03-26: qty 2
  Filled 2024-03-26: qty 1
  Filled 2024-03-26 (×12): qty 2
  Filled 2024-03-26: qty 1
  Filled 2024-03-26: qty 2

## 2024-03-26 MED ORDER — GLYCOPYRROLATE 0.2 MG/ML IJ SOLN
INTRAMUSCULAR | Status: AC
  Filled 2024-03-26: qty 1

## 2024-03-26 MED ORDER — LAMOTRIGINE 100 MG PO TABS
200.0000 mg | ORAL_TABLET | Freq: Every morning | ORAL | Status: DC
  Administered 2024-03-27 – 2024-03-31 (×5): 200 mg via ORAL
  Filled 2024-03-26 (×5): qty 2

## 2024-03-26 MED ORDER — METOCLOPRAMIDE HCL 5 MG PO TABS: 5.0000 mg | ORAL_TABLET | Freq: Three times a day (TID) | ORAL | Status: DC | PRN

## 2024-03-26 MED ORDER — KETOROLAC TROMETHAMINE 30 MG/ML IJ SOLN
INTRAMUSCULAR | Status: AC
  Filled 2024-03-26: qty 1

## 2024-03-26 MED ORDER — SUGAMMADEX SODIUM 200 MG/2ML IV SOLN
INTRAVENOUS | Status: DC | PRN
  Administered 2024-03-26: 200 mg via INTRAVENOUS

## 2024-03-26 MED ORDER — ONDANSETRON HCL 4 MG/2ML IJ SOLN
4.0000 mg | Freq: Four times a day (QID) | INTRAMUSCULAR | Status: DC | PRN
  Administered 2024-03-26: 4 mg via INTRAVENOUS
  Filled 2024-03-26: qty 2

## 2024-03-26 MED ORDER — DOCUSATE SODIUM 100 MG PO CAPS
100.0000 mg | ORAL_CAPSULE | Freq: Two times a day (BID) | ORAL | Status: DC
  Administered 2024-03-26 – 2024-03-31 (×10): 100 mg via ORAL
  Filled 2024-03-26 (×10): qty 1

## 2024-03-26 MED ORDER — HYDROCODONE-ACETAMINOPHEN 7.5-325 MG PO TABS: 1.0000 | ORAL_TABLET | ORAL | Status: DC | PRN

## 2024-03-26 MED ORDER — FENTANYL CITRATE PF 50 MCG/ML IJ SOSY
PREFILLED_SYRINGE | INTRAMUSCULAR | Status: AC
  Filled 2024-03-26: qty 1

## 2024-03-26 MED ORDER — SODIUM CHLORIDE 0.9 % IR SOLN
Status: DC | PRN
  Administered 2024-03-26: 1000 mL
  Administered 2024-03-26: 3000 mL

## 2024-03-26 MED ORDER — ACETAMINOPHEN 325 MG PO TABS: 325.0000 mg | ORAL_TABLET | Freq: Four times a day (QID) | ORAL | Status: DC | PRN

## 2024-03-26 MED ORDER — AMISULPRIDE (ANTIEMETIC) 5 MG/2ML IV SOLN
10.0000 mg | Freq: Once | INTRAVENOUS | Status: AC | PRN
  Administered 2024-03-26: 10 mg via INTRAVENOUS

## 2024-03-26 MED ORDER — GLYCOPYRROLATE 0.2 MG/ML IJ SOLN
INTRAMUSCULAR | Status: DC | PRN
  Administered 2024-03-26: .1 mg via INTRAVENOUS

## 2024-03-26 MED ORDER — VANCOMYCIN HCL 1000 MG IV SOLR
INTRAVENOUS | Status: DC | PRN
  Administered 2024-03-26: 1000 mg via INTRAVENOUS

## 2024-03-26 MED ORDER — SODIUM CHLORIDE (PF) 0.9 % IJ SOLN
INTRAMUSCULAR | Status: AC
  Filled 2024-03-26: qty 30

## 2024-03-26 MED ORDER — TRANEXAMIC ACID-NACL 1000-0.7 MG/100ML-% IV SOLN
1000.0000 mg | INTRAVENOUS | Status: AC
  Administered 2024-03-26: 1000 mg via INTRAVENOUS
  Filled 2024-03-26: qty 100

## 2024-03-26 MED ORDER — TOPIRAMATE 100 MG PO TABS
200.0000 mg | ORAL_TABLET | Freq: Every day | ORAL | Status: DC
  Administered 2024-03-26 – 2024-03-30 (×5): 200 mg via ORAL
  Filled 2024-03-26 (×5): qty 2

## 2024-03-26 MED ORDER — EPHEDRINE SULFATE (PRESSORS) 50 MG/ML IJ SOLN
INTRAMUSCULAR | Status: DC | PRN
Start: 2024-03-26 — End: 2024-03-26
  Administered 2024-03-26 (×2): 10 mg via INTRAVENOUS

## 2024-03-26 MED ORDER — BISACODYL 10 MG RE SUPP: 10.0000 mg | Freq: Every day | RECTAL | Status: DC | PRN

## 2024-03-26 MED ORDER — FENTANYL CITRATE (PF) 100 MCG/2ML IJ SOLN
INTRAMUSCULAR | Status: AC
  Filled 2024-03-26: qty 2

## 2024-03-26 MED ORDER — SODIUM CHLORIDE 0.9 % IV SOLN: 10.0000 mL/h | Freq: Once | INTRAVENOUS | Status: DC

## 2024-03-26 MED ORDER — CHLORHEXIDINE GLUCONATE 0.12 % MT SOLN
15.0000 mL | Freq: Once | OROMUCOSAL | Status: AC
  Administered 2024-03-26: 15 mL via OROMUCOSAL

## 2024-03-26 MED ORDER — ASPIRIN 81 MG PO CHEW
81.0000 mg | CHEWABLE_TABLET | Freq: Two times a day (BID) | ORAL | Status: DC
  Administered 2024-03-26 – 2024-03-31 (×10): 81 mg via ORAL
  Filled 2024-03-26 (×10): qty 1

## 2024-03-26 MED ORDER — PHENOL 1.4 % MT LIQD
1.0000 | OROMUCOSAL | Status: DC | PRN
Start: 2024-03-26 — End: 2024-03-31

## 2024-03-26 MED ORDER — PHENYLEPHRINE HCL (PRESSORS) 10 MG/ML IV SOLN
INTRAVENOUS | Status: DC | PRN
  Administered 2024-03-26: 40 ug via INTRAVENOUS

## 2024-03-26 SURGICAL SUPPLY — 54 items
BAG COUNTER SPONGE SURGICOUNT (BAG) IMPLANT
BAG ZIPLOCK 12X15 (MISCELLANEOUS) IMPLANT
BIT DRILL RINGLOC QUICK CONN (BIT) IMPLANT
BLADE SAW SGTL 11.0X1.19X90.0M (BLADE) IMPLANT
CHLORAPREP W/TINT 26 (MISCELLANEOUS) ×1 IMPLANT
CNTNR URN SCR LID CUP LEK RST (MISCELLANEOUS) IMPLANT
COVER PERINEAL POST (MISCELLANEOUS) ×1 IMPLANT
COVER SURGICAL LIGHT HANDLE (MISCELLANEOUS) ×1 IMPLANT
CUP OSSEOTI MULTI-H HIP 54MM G (Cup) IMPLANT
DERMABOND ADVANCED .7 DNX12 (GAUZE/BANDAGES/DRESSINGS) ×1 IMPLANT
DRAPE IMP U-DRAPE 54X76 (DRAPES) ×1 IMPLANT
DRAPE SHEET LG 3/4 BI-LAMINATE (DRAPES) ×3 IMPLANT
DRAPE STERI IOBAN 125X83 (DRAPES) ×1 IMPLANT
DRAPE U-SHAPE 47X51 STRL (DRAPES) ×2 IMPLANT
DRSG AQUACEL AG ADV 3.5X10 (GAUZE/BANDAGES/DRESSINGS) ×1 IMPLANT
ELECT BLADE TIP CTD 4 INCH (ELECTRODE) ×1 IMPLANT
EVACUATOR DRAINAGE 7X20 100CC (MISCELLANEOUS) IMPLANT
GAUZE 4X4 16PLY ~~LOC~~+RFID DBL (SPONGE) ×1 IMPLANT
GLOVE BIO SURGEON STRL SZ8.5 (GLOVE) ×2 IMPLANT
GLOVE BIOGEL M 7.0 STRL (GLOVE) ×1 IMPLANT
GLOVE BIOGEL PI IND STRL 7.5 (GLOVE) ×2 IMPLANT
GLOVE BIOGEL PI IND STRL 8 (GLOVE) ×1 IMPLANT
GLOVE BIOGEL PI IND STRL 8.5 (GLOVE) ×1 IMPLANT
GLOVE SURG LX STRL 7.5 STRW (GLOVE) ×2 IMPLANT
GOWN SPEC L3 XXLG W/TWL (GOWN DISPOSABLE) ×2 IMPLANT
GRAFT BNE CANC CHIPS 1-8 30CC (Bone Implant) IMPLANT
HEAD SROM 36MM PLUS 12 IMPLANT
HOLDER FOLEY CATH W/STRAP (MISCELLANEOUS) ×1 IMPLANT
HOOD PEEL AWAY T7 (MISCELLANEOUS) ×4 IMPLANT
KIT TURNOVER KIT A (KITS) ×1 IMPLANT
LINER ACETAB VIT E +5 36 F (Liner) IMPLANT
NDL SPNL 18GX3.5 QUINCKE PK (NEEDLE) ×1 IMPLANT
NEEDLE SPNL 18GX3.5 QUINCKE PK (NEEDLE) ×1 IMPLANT
PACK ANTERIOR HIP CUSTOM (KITS) ×1 IMPLANT
PENCIL SMOKE EVACUATOR (MISCELLANEOUS) ×1 IMPLANT
SAW OSC TIP CART 19.5X105X1.3 (SAW) ×1 IMPLANT
SCREW BN 30X6.5XST STRL ACTB (Screw) IMPLANT
SCREW BONE 6.5X20 (Screw) IMPLANT
SCREW BONE SELF TAP 6.5X40 (Screw) IMPLANT
SEALER BIPOLAR AQUA 6.0 (INSTRUMENTS) ×1 IMPLANT
SHELL ACETAB MULTIHOLE 56MM S7 (Hips) IMPLANT
SOLUTION PRONTOSAN WOUND 350ML (IRRIGATION / IRRIGATOR) ×1 IMPLANT
SUT ETHIBOND NAB CT1 #1 30IN (SUTURE) ×2 IMPLANT
SUT MNCRL AB 3-0 PS2 18 (SUTURE) ×1 IMPLANT
SUT MNCRL AB 4-0 PS2 18 (SUTURE) ×1 IMPLANT
SUT MON AB 2-0 CT1 36 (SUTURE) ×2 IMPLANT
SUT STRATAFIX 14 PDO 48 VLT (SUTURE) ×1 IMPLANT
SUT VIC AB 2-0 CT1 TAPERPNT 27 (SUTURE) ×2 IMPLANT
SYR 50ML LL SCALE MARK (SYRINGE) ×1 IMPLANT
TOWEL GREEN STERILE FF (TOWEL DISPOSABLE) ×1 IMPLANT
TRAY FOLEY MTR SLVR 16FR STAT (SET/KITS/TRAYS/PACK) ×1 IMPLANT
TUBE SUCTION HIGH CAP CLEAR NV (SUCTIONS) IMPLANT
TUBING CONNECTING 10 (TUBING) IMPLANT
WATER STERILE IRR 1000ML POUR (IV SOLUTION) ×1 IMPLANT

## 2024-03-26 NOTE — Interval H&P Note (Signed)
 History and Physical Interval Note:  03/26/2024 11:45 AM  Heather Gray  has presented today for surgery, with the diagnosis of Failed left total hip arthroplasty.  The various methods of treatment have been discussed with the patient and family. After consideration of risks, benefits and other options for treatment, the patient has consented to  Procedure(s) with comments: REVISION, TOTAL ARTHROPLASTY, HIP, ANTERIOR APPROACH (Left) - Acetabular component revision left total hip arthroplasty Anterior approach as a surgical intervention.  The patient's history has been reviewed, patient examined, no change in status, stable for surgery.  I have reviewed the patient's chart and labs.  Questions were answered to the patient's satisfaction.     Redell PARAS Elic Vencill

## 2024-03-26 NOTE — Anesthesia Procedure Notes (Signed)
 Procedure Name: Intubation Date/Time: 03/26/2024 12:13 PM  Performed by: Rhodia Debby MATSU, CRNAPre-anesthesia Checklist: Patient identified, Emergency Drugs available, Suction available, Patient being monitored and Timeout performed Patient Re-evaluated:Patient Re-evaluated prior to induction Oxygen Delivery Method: Circle system utilized Preoxygenation: Pre-oxygenation with 100% oxygen Induction Type: IV induction Ventilation: Mask ventilation without difficulty Laryngoscope Size: Miller and 2 Grade View: Grade I Tube type: Oral Number of attempts: 1 Airway Equipment and Method: Stylet Placement Confirmation: ETT inserted through vocal cords under direct vision, positive ETCO2 and breath sounds checked- equal and bilateral Secured at: 20 cm Tube secured with: Tape Dental Injury: Teeth and Oropharynx as per pre-operative assessment  Comments: Brief atraumatic smooth vss

## 2024-03-26 NOTE — Op Note (Signed)
 OPERATIVE REPORT   03/26/2024  3:24 PM  PATIENT:  Heather Gray   SURGEON:  Redell JINNY Shoals, MD  ASSISTANT:  Valery Potters, PA-C.   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  POSTOPERATIVE DIAGNOSIS:  Same.  PROCEDURE:  Revision left total hip arthroplasty, acetabular component.  ANESTHESIA:   GETA.  ANTIBIOTICS:  2g Ancef . 1g vancomycin .  EXPLANTS:  DePuy ASR cup, size 52 mm. 46 mm metal head ball with +0 mm spacer.  IMPLANTS: Biomet G7 Osseo Ti multihole acetabular shell, size 56 mm. 6.5 mm cancellous bone screw x 3. Vivacit-E polyliner, F, 36 mm +5 mm neutral. DePuy metal head ball, size 36+12 mm, 11/13 taper.  SPECIMENS: Acetabular interface membrane for routine aerobic and anaerobic tissue culture.  ESTIMATED BLOOD LOSS: 800 cc.  BLOOD PRODUCTS: 2 units PRBCs.  COMPLICATIONS: None.  DISPOSITION: Stable to PACU.  SURGICAL INDICATIONS:  Heather Gray is a 73 y.o. female who underwent primary left total hip arthroplasty by Dr. Liam in 2008 with DePuy S-ROM stem and ASR cup with metal-on-metal bearing.  Patient had been doing well for many years, until she recently developed left hip pain.  I obtained serum metal ion levels, revealing cobalt 5.1 and chromium 2.7.  MRI was obtained on 02/19/2024, showing left superior and inferior pubic rami insufficiency fractures, sacral ala insufficiency fracture, and periprosthetic fluid collection extending to the G max muscle.  No apparent abductor muscle damage was present on the MRI.  Due to these findings and persistent pain, she was indicated for acetabular revision.  We discussed the risk benefits, and alternatives, she elected to proceed.  PROCEDURE IN DETAIL: The patient was identified in the holding area using 2 identifiers.  The surgical site was marked by myself.  She was taken to the operating room, and general anesthesia was obtained.  She was transferred to the [facility] table.  All bony  prominences were well-padded.  The left hip was prepped and draped in the normal sterile surgical fashion.  Timeout was called, verifying side and site of surgery.  She did receive IV antibiotics within 60 minutes of beginning the procedure.  Bikini incision was made, and superficial dissection was performed lateral to the ASIS.  Standard anterior approach of the hip was performed through the Heuter interval.  Anterior capsulotomy was performed, and there was copious metallic stained joint fluid.  The hip was gently dislocated with a bone hook.  Using a bone tamp, I easily removed the head ball and spacer from the trunnion.  There was no significant damage noted to the trunnion.  The trunnion was placed posteriorly and acetabular retractors were positioned.  Using the DePuy cup out extractor, I loosened the interface between the implant and bone.  The cup was removed without any significant bone loss.  I examined the native acetabulum.  There was a significant cyst located superiorly as well as retroacetabular bone loss with an intact medial wall.  The anterior wall was deficient.  The posterior wall and ischium were intact.  Using a curette, I debrided the superior retroacetabular cyst, and I sent the tissue for routine aerobic and anaerobic culture.  I then reamed up to a 53 mm reamer.  Allograft cancellous bone chips were used to graft the retroacetabular defect and the contained superior cyst.  The bone graft was impacted using a 52 mm reamer on reverse.  Due to deficiency of the anterior wall, I then elected to not enlarge the native socket  any further with larger reamers, and I placed a 54 mm cup.  The cup did not achieve adequate bony fixation and migrated somewhat medial to the depth of the reamer.  I then removed the cup and reassessed the native acetabulum.  The medial wall was intact.  I then sized up to a 56 mm cup, which I impacted into place at approximately 40 degrees of abduction and 20 degrees  of anteversion.  The cup achieved adequate initial press-fit fixation.  I elected to augment the initial press-fit fixation with 6.5 mm cancellous bone screws x 3.  I placed a trial dual mobility liner.  I regained femoral exposure, and placed a trial dual mobility construct with a 28+12 mm inner head ball.  The hip was reduced.  Stability testing was performed, and I determined that the stability was not adequate.  The hip was dislocated, and all trial implants were removed.  I then placed a trial 36+5 mm neutral liner, followed by a 36+12 mm trial head ball.  The hip was reduced.  Stability testing was performed and found to be excellent.  At 90 degrees of external rotation and full extension, the hip was stable to an anterior directed force.  The hip was then dislocated, and trial components were removed.  I placed the real 36+5 mm neutral liner followed by the real 36+12 mm metal head ball.  The hip was reduced.  Repeat stability testing was performed and found to be unchanged.  Leg length, screw position, and offset were assessed with fluoroscopy and found to be excellent.  The wound was then irrigated with Prontosan solution followed by normal saline using pulsatile lavage.  The wound was closed in layers with #1 strata fix for the fascia, 2-0 Vicryl for the deep fatty layer, 2-0 Monocryl for the deep dermal layer, and 3-0 Monocryl for the subcuticular layer.  Dermabond was applied to the skin.  Once the glue was fully hardened, an Aquacel Ag dressing was applied.  The patient was then awakened from anesthesia and taken to the PACU in stable condition.  Sponge, needle, and iinstrument counts were correct at the end of the case x 2.  There were no known complications.

## 2024-03-26 NOTE — Progress Notes (Signed)
 Orthopedic Tech Progress Note Patient Details:  [name] 1951-02-11 [id number]  Patient ID: Heather Gray, female   DOB: 1951-08-02, 73 y.o.   MRN: [id number] No OHF. Age restricted Heather Gray 03/26/2024, 6:11 PM

## 2024-03-26 NOTE — Discharge Instructions (Signed)
 Dr. Redell Shoals Joint Replacement Specialist [facility] [address], [address] [place], [place] [address] [phone]   TOTAL HIP REPLACEMENT POSTOPERATIVE DIRECTIONS    Hip Rehabilitation, Guidelines Following Surgery   WEIGHT BEARING Other:  Touchdown weightbearing left lower extremity with hip abduction brace and walker.   The results of a hip operation are greatly improved after range of motion and muscle strengthening exercises. Follow all safety measures which are given to protect your hip. If any of these exercises cause increased pain or swelling in your joint, decrease the amount until you are comfortable again. Then slowly increase the exercises. Call your caregiver if you have problems or questions.   HOME CARE INSTRUCTIONS  Most of the following instructions are designed to prevent the dislocation of your new hip.  Remove items at home which could result in a fall. This includes throw rugs or furniture in walking pathways.  Continue medications as instructed at time of discharge. You may have some home medications which will be placed on hold until you complete the course of blood thinner medication. You may start showering once you are discharged home. Do not remove your dressing. Do not put on socks or shoes without following the instructions of your caregivers.   Sit on chairs with arms. Use the chair arms to help push yourself up when arising.  Arrange for the use of a toilet seat elevator so you are not sitting low.  Walk with walker as instructed.  You may resume a sexual relationship in one month or when given the OK by your caregiver.  Use walker as long as suggested by your caregivers.  You may put full weight on your legs and walk as much as is comfortable. Avoid periods of inactivity such as sitting longer than an hour when not asleep. This helps prevent blood clots.  You may return to work once you are cleared by Designer, industrial/product.  Do not  drive a car for 6 weeks or until released by your surgeon.  Do not drive while taking narcotics.  Wear elastic stockings for two weeks following surgery during the day but you may remove then at night.  Make sure you keep all of your appointments after your operation with all of your doctors and caregivers. You should call the office at the above phone number and make an appointment for approximately two weeks after the date of your surgery. Please pick up a stool softener and laxative for home use as long as you are requiring pain medications. ICE to the affected hip every three hours for 30 minutes at a time and then as needed for pain and swelling. Continue to use ice on the hip for pain and swelling from surgery. You may notice swelling that will progress down to the foot and ankle.  This is normal after surgery.  Elevate the leg when you are not up walking on it.   It is important for you to complete the blood thinner medication as prescribed by your doctor. Continue to use the breathing machine which will help keep your temperature down.  It is common for your temperature to cycle up and down following surgery, especially at night when you are not up moving around and exerting yourself.  The breathing machine keeps your lungs expanded and your temperature down.  RANGE OF MOTION AND STRENGTHENING EXERCISES  These exercises are designed to help you keep full movement of your hip joint. Follow your caregiver's or physical therapist's instructions.  Perform all exercises about fifteen times, three times per day or as directed. Exercise both hips, even if you have had only one joint replacement. These exercises can be done on a training (exercise) mat, on the floor, on a table or on a bed. Use whatever works the best and is most comfortable for you. Use music or television while you are exercising so that the exercises are a pleasant break in your day. This will make your life better with the exercises  acting as a break in routine you can look forward to.  Lying on your back, slowly slide your foot toward your buttocks, raising your knee up off the floor. Then slowly slide your foot back down until your leg is straight again.  Lying on your back spread your legs as far apart as you can without causing discomfort.  Lying on your side, raise your upper leg and foot straight up from the floor as far as is comfortable. Slowly lower the leg and repeat.  Lying on your back, tighten up the muscle in the front of your thigh (quadriceps muscles). You can do this by keeping your leg straight and trying to raise your heel off the floor. This helps strengthen the largest muscle supporting your knee.  Lying on your back, tighten up the muscles of your buttocks both with the legs straight and with the knee bent at a comfortable angle while keeping your heel on the floor.   SKILLED REHAB INSTRUCTIONS: If the patient is transferred to a skilled rehab facility following release from the hospital, a list of the current medications will be sent to the facility for the patient to continue.  When discharged from the skilled rehab facility, please have the facility set up the patient's Home Health Physical Therapy prior to being released. Also, the skilled facility will be responsible for providing the patient with their medications at time of release from the facility to include their pain medication and their blood thinner medication. If the patient is still at the rehab facility at time of the two week follow up appointment, the skilled rehab facility will also need to assist the patient in arranging follow up appointment in our office and any transportation needs.  POST-OPERATIVE OPIOID TAPER INSTRUCTIONS: It is important to wean off of your opioid medication as soon as possible. If you do not need pain medication after your surgery it is ok to stop day one. Opioids include: Codeine, Hydrocodone (Norco, Vicodin),  Oxycodone (Percocet, oxycontin ) and hydromorphone  amongst others.  Long term and even short term use of opiods can cause: Increased pain response Dependence Constipation Depression Respiratory depression And more.  Withdrawal symptoms can include Flu like symptoms Nausea, vomiting And more Techniques to manage these symptoms Hydrate well Eat regular healthy meals Stay active Use relaxation techniques(deep breathing, meditating, yoga) Do Not substitute Alcohol  to help with tapering If you have been on opioids for less than two weeks and do not have pain than it is ok to stop all together.  Plan to wean off of opioids This plan should start within one week post op of your joint replacement. Maintain the same interval or time between taking each dose and first decrease the dose.  Cut the total daily intake of opioids by one tablet each day Next start to increase the time between doses. The last dose that should be eliminated is the evening dose.    MAKE SURE YOU:  Understand these instructions.  Will watch your condition.  Will get  help right away if you are not doing well or get worse.  Pick up stool softner and laxative for home use following surgery while on pain medications. Do not remove your dressing. The dressing is waterproof--it is OK to take showers. Continue to use ice for pain and swelling after surgery. Do not use any lotions or creams on the incision until instructed by your surgeon. Posterior hip precautions. Wear hip abduction brace as directed.  Touchdown weightbearing on left lower extremity.

## 2024-03-26 NOTE — Progress Notes (Signed)
 Orthopedic Tech Progress Note Patient Details:  Heather Gray [birth year] [id number]  Patient ID: [name], female   DOB: 1951-01-31, 73 y.o.   MRN: [id number]  [name], [name] 03/26/2024, 4:16 PM Hip abd brace ordered from [facility]

## 2024-03-26 NOTE — Transfer of Care (Signed)
 Immediate Anesthesia Transfer of Care Note  Patient: Heather Gray  Procedure(s) Performed: REVISION, TOTAL ARTHROPLASTY, HIP, ANTERIOR APPROACH (Left: Hip)  Patient Location: PACU  Anesthesia Type:General  Level of Consciousness: awake, alert , and oriented  Airway & Oxygen Therapy: Patient Spontanous Breathing and Patient connected to face mask oxygen  Post-op Assessment: Report given to RN  Post vital signs: Reviewed and stable  Last Vitals:  Vitals Value Taken Time  BP 146/79 03/26/24 15:53  Temp    Pulse 58 03/26/24 15:54  Resp 12 03/26/24 15:54  SpO2 100 % 03/26/24 15:54  Vitals shown include unfiled device data.  Last Pain:  Vitals:   03/26/24 0852  TempSrc:   PainSc: 0-No pain         Complications: No notable events documented.

## 2024-03-27 ENCOUNTER — Encounter (HOSPITAL_COMMUNITY): Payer: Self-pay | Admitting: Orthopedic Surgery

## 2024-03-27 LAB — BASIC METABOLIC PANEL WITH GFR
Anion gap: 7 (ref 5–15)
BUN: 31 mg/dL — ABNORMAL HIGH (ref 8–23)
CO2: 22 mmol/L (ref 22–32)
Calcium: 8.1 mg/dL — ABNORMAL LOW (ref 8.9–10.3)
Chloride: 108 mmol/L (ref 98–111)
Creatinine, Ser: 1.11 mg/dL — ABNORMAL HIGH (ref 0.44–1.00)
GFR, Estimated: 53 mL/min — ABNORMAL LOW (ref >60)
Glucose, Bld: 115 mg/dL — ABNORMAL HIGH (ref 70–99)
Potassium: 4.2 mmol/L (ref 3.5–5.1)
Sodium: 137 mmol/L (ref 135–145)

## 2024-03-27 LAB — CBC
HCT: 28.1 % — ABNORMAL LOW (ref 36.0–46.0)
Hemoglobin: 9.6 g/dL — ABNORMAL LOW (ref 12.0–15.0)
MCH: 31.6 pg (ref 26.0–34.0)
MCHC: 34.2 g/dL (ref 30.0–36.0)
MCV: 92.4 fL (ref 80.0–100.0)
Platelets: 171 K/uL (ref 150–400)
RBC: 3.04 MIL/uL — ABNORMAL LOW (ref 3.87–5.11)
RDW: 13.9 % (ref 11.5–15.5)
WBC: 9 K/uL (ref 4.0–10.5)
nRBC: 0 % (ref 0.0–0.2)

## 2024-03-27 MED ORDER — SENNA 8.6 MG PO TABS: 2.0000 | ORAL_TABLET | Freq: Every day | ORAL | 0 refills | Status: AC

## 2024-03-27 MED ORDER — POLYETHYLENE GLYCOL 3350 17 G PO PACK: 17.0000 g | PACK | Freq: Every day | ORAL | 0 refills | Status: AC | PRN

## 2024-03-27 MED ORDER — DOCUSATE SODIUM 100 MG PO CAPS: 100.0000 mg | ORAL_CAPSULE | Freq: Two times a day (BID) | ORAL | 0 refills | Status: AC

## 2024-03-27 MED ORDER — ASPIRIN 81 MG PO CHEW: 81.0000 mg | CHEWABLE_TABLET | Freq: Two times a day (BID) | ORAL | 0 refills | Status: DC

## 2024-03-27 MED ORDER — HYDROCODONE-ACETAMINOPHEN 5-325 MG PO TABS: 1.0000 | ORAL_TABLET | ORAL | 0 refills | Status: DC | PRN

## 2024-03-27 MED ORDER — ONDANSETRON HCL 4 MG PO TABS: 4.0000 mg | ORAL_TABLET | Freq: Three times a day (TID) | ORAL | 0 refills | Status: DC | PRN

## 2024-03-27 NOTE — Plan of Care (Signed)
   Problem: Activity: Goal: Risk for activity intolerance will decrease Outcome: Progressing   Problem: Pain Managment: Goal: General experience of comfort will improve and/or be controlled Outcome: Progressing   Problem: Safety: Goal: Ability to remain free from injury will improve Outcome: Progressing

## 2024-03-27 NOTE — Evaluation (Signed)
 Physical Therapy Evaluation Patient Details Name: Heather Gray MRN: [id number] DOB: 10-Nov-1950 Today's Date: 03/27/2024  History of Present Illness  Pt s/p L THR acetabular revision 2* recalled component.  Pt with hx of CKD, Bil THR, and bipolar  Clinical Impression  Pt admitted as above and presenting with functional mobility limitations 2* decreased L LE strength/ROM, post op pain, posterior THP, TWB on L, ambulatory balance deficits, and decreased activity tolerance.  Pt should progress to dc home with assist of family and friends.        If plan is discharge home, recommend the following: A little help with walking and/or transfers;A little help with bathing/dressing/bathroom;Assistance with cooking/housework;Assist for transportation;Help with stairs or ramp for entrance   Can travel by private vehicle        Equipment Recommendations None recommended by PT  Recommendations for Other Services       Functional Status Assessment Patient has had a recent decline in their functional status and demonstrates the ability to make significant improvements in function in a reasonable and predictable amount of time.     Precautions / Restrictions Precautions Precautions: Fall;Posterior Hip Precaution Booklet Issued: Yes (comment) Recall of Precautions/Restrictions: Intact Restrictions Weight Bearing Restrictions Per Provider Order: Yes LLE Weight Bearing Per Provider Order: Touchdown weight bearing      Mobility  Bed Mobility Overal bed mobility: Needs Assistance Bed Mobility: Supine to Sit     Supine to sit: Min assist, Mod assist     General bed mobility comments: Increased time with cues for sequence, use of R LE to self assist and adherence to THP    Transfers Overall transfer level: Needs assistance Equipment used: Rolling walker (2 wheels) Transfers: Sit to/from Stand Sit to Stand: Min assist, Mod assist           General transfer comment: cues for LE management,  use of UEs to self assist and adherence to THP    Ambulation/Gait Ambulation/Gait assistance: Min assist, +2 safety/equipment Gait Distance (Feet): 10 Feet Assistive device: Rolling walker (2 wheels) Gait Pattern/deviations: Step-to pattern, Decreased step length - right, Decreased step length - left, Shuffle, Trunk flexed Gait velocity: decr     General Gait Details: cues for sequence, posture and position from RW; distance ltd by fatigue but good follow through with TWB on L  Stairs            Wheelchair Mobility     Tilt Bed    Modified Rankin (Stroke Patients Only)       Balance Overall balance assessment: Needs assistance Sitting-balance support: No upper extremity supported, Feet supported Sitting balance-Leahy Scale: Fair     Standing balance support: Bilateral upper extremity supported Standing balance-Leahy Scale: Poor                               Pertinent Vitals/Pain Pain Assessment Pain Assessment: 0-10 Pain Score: 5  Pain Location: L hip Pain Descriptors / Indicators: Aching, Sore Pain Intervention(s): Limited activity within patient's tolerance, Monitored during session, Premedicated before session, Ice applied    Home Living Family/patient expects to be discharged to:: Private residence Living Arrangements: Alone Available Help at Discharge: Friend(s);Personal care attendant;Available 24 hours/day (available 24/7 initially) Type of Home: House Home Access: Level entry       Home Layout: One level Home Equipment: Agricultural consultant (2 wheels);Cane - single point;BSC/3in1      Prior Function Prior Level of Function :  Independent/Modified Independent                     Extremity/Trunk Assessment   Upper Extremity Assessment Upper Extremity Assessment: Overall WFL for tasks assessed    Lower Extremity Assessment Lower Extremity Assessment: LLE deficits/detail LLE Deficits / Details: AAROM at L hip to 80 flex     Cervical / Trunk Assessment Cervical / Trunk Assessment: Normal  Communication   Communication Communication: No apparent difficulties    Cognition Arousal: Alert Behavior During Therapy: WFL for tasks assessed/performed   PT - Cognitive impairments: No apparent impairments                         Following commands: Intact       Cueing       General Comments      Exercises Total Joint Exercises Ankle Circles/Pumps: AROM, Both, 15 reps, Supine Heel Slides: AAROM, Left, 15 reps, Supine   Assessment/Plan    PT Assessment Patient needs continued PT services  PT Problem List Decreased strength;Decreased range of motion;Decreased activity tolerance;Decreased balance;Decreased mobility;Decreased knowledge of use of DME;Pain       PT Treatment Interventions DME instruction;Gait training;Functional mobility training;Therapeutic activities;Therapeutic exercise;Balance training;Patient/family education    PT Goals (Current goals can be found in the Care Plan section)  Acute Rehab PT Goals Patient Stated Goal: Regain IND PT Goal Formulation: With patient Time For Goal Achievement: 04/03/24 Potential to Achieve Goals: Good    Frequency 7X/week     Co-evaluation               AM-PAC PT 6 Clicks Mobility  Outcome Measure Help needed turning from your back to your side while in a flat bed without using bedrails?: A Lot Help needed moving from lying on your back to sitting on the side of a flat bed without using bedrails?: A Lot Help needed moving to and from a bed to a chair (including a wheelchair)?: A Lot Help needed standing up from a chair using your arms (e.g., wheelchair or bedside chair)?: A Lot Help needed to walk in hospital room?: A Lot Help needed climbing 3-5 steps with a railing? : Total 6 Click Score: 11    End of Session Equipment Utilized During Treatment: Gait belt Activity Tolerance: Patient tolerated treatment well Patient left:  in chair;with call bell/phone within reach;with chair alarm set Nurse Communication: Mobility status PT Visit Diagnosis: Difficulty in walking, not elsewhere classified (R26.2)    Time: 1003-1030 PT Time Calculation (min) (ACUTE ONLY): 27 min   Charges:   PT Evaluation $PT Eval Low Complexity: 1 Low PT Treatments $Gait Training: 8-22 mins PT General Charges $$ ACUTE PT VISIT: 1 Visit         [facility] PT Acute Rehabilitation Services Office [phone]   Olivea Sonnen 03/27/2024, 3:35 PM

## 2024-03-27 NOTE — TOC Transition Note (Signed)
 Transition of Care [facility]) - Discharge Note   Patient Details  Name: Heather Gray MRN: [id number] Date of Birth: [birth year]  Transition of Care [facility] CM/SW Contact:  NORMAN ASPEN, LCSW Phone Number: 03/27/2024, 11:04 AM   Clinical Narrative:     Met with pt who confirms she has all needed DME in the home.  Plan for HEP.  No further TOC needs.  Final next level of care: Home/Self Care Barriers to Discharge: No Barriers Identified   Patient Goals and CMS Choice Patient states their goals for this hospitalization and ongoing recovery are:: return home          Discharge Placement                       Discharge Plan and Services Additional resources added to the After Visit Summary for                  DME Arranged: N/A DME Agency: NA                  Social Drivers of Health (SDOH) Interventions SDOH Screenings   Food Insecurity: No Food Insecurity (03/26/2024)  Housing: Low Risk  (03/26/2024)  Transportation Needs: No Transportation Needs (03/26/2024)  Utilities: Not At Risk (03/26/2024)  Financial Resource Strain: Low Risk  (05/07/2022)   Received from [facility] visits prior to 10/27/2022., [facility]  Physical Activity: Sufficiently Active (05/07/2022)   Received from [facility], [facility] visits prior to 10/27/2022.  Social Connections: Unknown (03/26/2024)  Stress: Stress Concern Present (05/07/2022)   Received from [facility] visits prior to 10/27/2022., [facility]  Tobacco Use: Medium Risk (03/26/2024)     Readmission Risk Interventions    03/27/2024   11:03 AM  Readmission Risk Prevention Plan  Post Dischage Appt Complete  Medication Screening Complete  Transportation Screening Complete

## 2024-03-27 NOTE — Progress Notes (Signed)
 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  Pt with hx of CKD, Bil THR, and bipolar    PT Comments  Pt very motivated and with noted improvement in performance of most mobility tasks but continues to require increased time and assist.      If plan is discharge home, recommend the following: A little help with walking and/or transfers;A little help with bathing/dressing/bathroom;Assistance with cooking/housework;Assist for transportation;Help with stairs or ramp for entrance   Can travel by private vehicle        Equipment Recommendations  None recommended by PT    Recommendations for Other Services       Precautions / Restrictions Precautions Precautions: Fall;Posterior Hip Precaution Booklet Issued: Yes (comment) Recall of Precautions/Restrictions: Intact Restrictions Weight Bearing Restrictions Per Provider Order: Yes LLE Weight Bearing Per Provider Order: Touchdown weight bearing Other Position/Activity Restrictions: 0-10     Mobility  Bed Mobility Overal bed mobility: Needs Assistance Bed Mobility: Sit to Supine     Supine to sit: Min assist, Mod assist Sit to supine: Min assist, Mod assist   General bed mobility comments: Increased time with cues for sequence, use of R LE to self assist and adherence to THP    Transfers Overall transfer level: Needs assistance Equipment used: Rolling walker (2 wheels) Transfers: Sit to/from Stand Sit to Stand: Min assist, Mod assist           General transfer comment: cues for LE management, use of UEs to self assist and adherence to THP    Ambulation/Gait Ambulation/Gait assistance: Min assist Gait Distance (Feet): 20 Feet Assistive device: Rolling walker (2 wheels) Gait Pattern/deviations: Step-to pattern, Decreased step length - right, Decreased step length -  left, Shuffle, Trunk flexed Gait velocity: decr     General Gait Details: cues for sequence, posture and position from RW; distance ltd by fatigue but good follow through with TWB on L   Stairs             Wheelchair Mobility     Tilt Bed    Modified Rankin (Stroke Patients Only)       Balance Overall balance assessment: Needs assistance Sitting-balance support: No upper extremity supported, Feet supported Sitting balance-Leahy Scale: Fair     Standing balance support: Bilateral upper extremity supported Standing balance-Leahy Scale: Poor                              Communication Communication Communication: No apparent difficulties  Cognition Arousal: Alert Behavior During Therapy: WFL for tasks assessed/performed   PT - Cognitive impairments: No apparent impairments                         Following commands: Intact      Cueing    Exercises Total Joint Exercises Ankle Circles/Pumps: AROM, Both, 15 reps, Supine Heel Slides: AAROM, Left, 15 reps, Supine    General Comments        Pertinent Vitals/Pain Pain Assessment Pain Assessment: 0-10 Pain Score: 5  Pain Location: L hip Pain Descriptors / Indicators: Aching, Sore Pain Intervention(s): Limited activity within patient's tolerance, Monitored during session, Premedicated before session, Ice applied    Home Living Family/patient expects to be discharged to:: Private residence Living Arrangements: Alone Available Help at Discharge: Friend(s);Personal  care attendant;Available 24 hours/day (available 24/7 initially) Type of Home: House Home Access: Level entry       Home Layout: One level Home Equipment: Agricultural consultant (2 wheels);Cane - single point;BSC/3in1      Prior Function            PT Goals (current goals can now be found in the care plan section) Acute Rehab PT Goals Patient Stated Goal: Regain IND PT Goal Formulation: With patient Time For Goal  Achievement: 04/03/24 Potential to Achieve Goals: Good Progress towards PT goals: Progressing toward goals    Frequency    7X/week      PT Plan      Co-evaluation              AM-PAC PT 6 Clicks Mobility   Outcome Measure  Help needed turning from your back to your side while in a flat bed without using bedrails?: A Lot Help needed moving from lying on your back to sitting on the side of a flat bed without using bedrails?: A Lot Help needed moving to and from a bed to a chair (including a wheelchair)?: A Lot Help needed standing up from a chair using your arms (e.g., wheelchair or bedside chair)?: A Little Help needed to walk in hospital room?: A Little Help needed climbing 3-5 steps with a railing? : Total 6 Click Score: 13    End of Session Equipment Utilized During Treatment: Gait belt Activity Tolerance: Patient tolerated treatment well Patient left: with call bell/phone within reach;with chair alarm set;in bed Nurse Communication: Mobility status PT Visit Diagnosis: Difficulty in walking, not elsewhere classified (R26.2)     Time: 1440-1505 PT Time Calculation (min) (ACUTE ONLY): 25 min  Charges:    $Gait Training: 8-22 mins $Therapeutic Activity: 8-22 mins PT General Charges $$ ACUTE PT VISIT: 1 Visit                     [facility] PT Acute Rehabilitation Services Office [phone]    [facility] 03/27/2024, 3:49 PM

## 2024-03-27 NOTE — Progress Notes (Signed)
    Subjective:  Patient reports pain as mild to moderate.  Denies N/V/CP/SOB/Abd currently. She reports some nausea yesterday.  Pain well controlled.  She was able to eat breakfast without issues.  Up with PT today.   Objective:   VITALS:   Vitals:   03/26/24 1807 03/26/24 2053 03/27/24 0214 03/27/24 0645  BP:  (!) 151/88 121/67 107/68  Pulse:  66 61 (!) 57  Resp:  19 17 18   Temp: 97.8 F (36.6 C) 97.8 F (36.6 C) 97.9 F (36.6 C) 98.1 F (36.7 C)  TempSrc:    Oral  SpO2:  100% 100% 100%  Weight:      Height:        NAD Neurologically intact ABD soft Neurovascular intact Sensation intact distally Intact pulses distally Dorsiflexion/Plantar flexion intact Incision: dressing C/D/I No cellulitis present Compartment soft   Lab Results  Component Value Date   WBC 9.0 03/27/2024   HGB 9.6 (L) 03/27/2024   HCT 28.1 (L) 03/27/2024   MCV 92.4 03/27/2024   PLT 171 03/27/2024   BMET    Component Value Date/Time   NA 137 03/27/2024 0322   K 4.2 03/27/2024 0322   CL 108 03/27/2024 0322   CO2 22 03/27/2024 0322   GLUCOSE 115 (H) 03/27/2024 0322   BUN 31 (H) 03/27/2024 0322   CREATININE 1.11 (H) 03/27/2024 0322   CALCIUM 8.1 (L) 03/27/2024 0322   GFRNONAA 53 (L) 03/27/2024 0322     Assessment/Plan: 1 Day Post-Op   Principal Problem:   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 Hemoglobin 9.6. She received 2 units PRBC intra-op yesterday. Continue to monitor.   TDWB with walker, hip abduction brace, posterior hip precautions.  DVT ppx: Aspirin , SCDs, TEDS PO pain control PT/OT To come today.  Dispo: - Hip abduction brace ordered, pending fitting. - D/c home likely tomorrow vs Sunday pending PT progression with HEP.    Heather Gray Potters 03/27/2024, 8:41 AM   [facility]  [place] [address], [place], [place] [address] Phone:  [phone] [email or web address] Facebook  [employer]

## 2024-03-28 LAB — POCT I-STAT EG7
Acid-base deficit: 6 mmol/L — ABNORMAL HIGH (ref 0.0–2.0)
Bicarbonate: 21.1 mmol/L (ref 20.0–28.0)
Calcium, Ion: 1.22 mmol/L (ref 1.15–1.40)
HCT: 22 % — ABNORMAL LOW (ref 36.0–46.0)
Hemoglobin: 7.5 g/dL — ABNORMAL LOW (ref 12.0–15.0)
O2 Saturation: 94 %
Potassium: 3.9 mmol/L (ref 3.5–5.1)
Sodium: 140 mmol/L (ref 135–145)
TCO2: 22 mmol/L (ref 22–32)
pCO2, Ven: 46.1 mmHg (ref 44–60)
pH, Ven: 7.268 (ref 7.25–7.43)
pO2, Ven: 81 mmHg — ABNORMAL HIGH (ref 32–45)

## 2024-03-28 LAB — BASIC METABOLIC PANEL WITH GFR
Anion gap: 6 (ref 5–15)
BUN: 31 mg/dL — ABNORMAL HIGH (ref 8–23)
CO2: 22 mmol/L (ref 22–32)
Calcium: 8.1 mg/dL — ABNORMAL LOW (ref 8.9–10.3)
Chloride: 107 mmol/L (ref 98–111)
Creatinine, Ser: 1.19 mg/dL — ABNORMAL HIGH (ref 0.44–1.00)
GFR, Estimated: 49 mL/min — ABNORMAL LOW (ref >60)
Glucose, Bld: 102 mg/dL — ABNORMAL HIGH (ref 70–99)
Potassium: 3.7 mmol/L (ref 3.5–5.1)
Sodium: 135 mmol/L (ref 135–145)

## 2024-03-28 LAB — CBC
HCT: 23.3 % — ABNORMAL LOW (ref 36.0–46.0)
Hemoglobin: 7.8 g/dL — ABNORMAL LOW (ref 12.0–15.0)
MCH: 31.3 pg (ref 26.0–34.0)
MCHC: 33.5 g/dL (ref 30.0–36.0)
MCV: 93.6 fL (ref 80.0–100.0)
Platelets: 147 K/uL — ABNORMAL LOW (ref 150–400)
RBC: 2.49 MIL/uL — ABNORMAL LOW (ref 3.87–5.11)
RDW: 13.8 % (ref 11.5–15.5)
WBC: 7.8 K/uL (ref 4.0–10.5)
nRBC: 0 % (ref 0.0–0.2)

## 2024-03-28 LAB — PREPARE RBC (CROSSMATCH)

## 2024-03-28 MED ORDER — SODIUM CHLORIDE 0.9% IV SOLUTION: Freq: Once | INTRAVENOUS | Status: AC

## 2024-03-28 NOTE — Progress Notes (Signed)
 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  Pt with hx of CKD, Bil THR, and bipolar    PT Comments  Pt continues very motivated but progressing slowly with mobility - demonstrates good follow through with TWB but fatigues easily.  Pt also noted to require significant assist for application of abductor brace.  Current level of assist is concerning 2* pt report of limited assist in home setting.  OT eval pending this am for further insight.    If plan is discharge home, recommend the following: A little help with walking and/or transfers;A little help with bathing/dressing/bathroom;Assistance with cooking/housework;Assist for transportation;Help with stairs or ramp for entrance   Can travel by private vehicle        Equipment Recommendations  None recommended by PT    Recommendations for Other Services       Precautions / Restrictions Precautions Precautions: Fall;Posterior Hip Precaution Booklet Issued: Yes (comment) Recall of Precautions/Restrictions: Intact Required Braces or Orthoses: Other Brace Restrictions Weight Bearing Restrictions Per Provider Order: Yes LLE Weight Bearing Per Provider Order: Touchdown weight bearing     Mobility  Bed Mobility Overal bed mobility: Needs Assistance Bed Mobility: Rolling, Supine to Sit Rolling: Min assist   Supine to sit: Min assist     General bed mobility comments: Min assist to place pillow between LEs and assist with log roll for placement of Abductor brace    Transfers Overall transfer level: Needs assistance Equipment used: Rolling walker (2 wheels) Transfers: Sit to/from Stand Sit to Stand: Contact guard assist, Min assist           General transfer comment: cues for LE management, adherence to THP and use of UEs to self assist     Ambulation/Gait Ambulation/Gait assistance: Min assist, Contact guard assist Gait Distance (Feet): 23 Feet Assistive device: Rolling walker (2 wheels) Gait Pattern/deviations: Step-to pattern, Decreased step length - right, Decreased step length - left, Shuffle, Trunk flexed Gait velocity: decr     General Gait Details: cues for sequence, posture and position from RW; distance ltd by fatigue but good follow through with TWB on L   Stairs             Wheelchair Mobility     Tilt Bed    Modified Rankin (Stroke Patients Only)       Balance Overall balance assessment: Needs assistance Sitting-balance support: Feet supported, No upper extremity supported Sitting balance-Leahy Scale: Fair     Standing balance support: Bilateral upper extremity supported Standing balance-Leahy Scale: Poor Standing balance comment: reliant on RW                            Communication Communication Communication: No apparent difficulties  Cognition Arousal: Alert Behavior During Therapy: WFL for tasks assessed/performed   PT - Cognitive impairments: No apparent impairments                         Following commands: Intact      Cueing Cueing Techniques: Verbal cues  Exercises      General Comments        Pertinent Vitals/Pain Pain Assessment Pain Assessment: 0-10 Pain Score: 5  Pain Location: L hip Pain Descriptors / Indicators: Aching, Sore Pain Intervention(s): Limited activity within patient's tolerance, Monitored during session,  Premedicated before session, Ice applied    Home Living Family/patient expects to be discharged to:: Private residence Living Arrangements: Alone Available Help at Discharge: Friend(s);Personal care attendant;Available 24 hours/day (per pt only for a day or two) Type of Home: House Home Access: Level entry       Home Layout: One level Home Equipment: Agricultural consultant (2 wheels);Cane - single point;BSC/3in1       Prior Function            PT Goals (current goals can now be found in the care plan section) Acute Rehab PT Goals Patient Stated Goal: Regain IND PT Goal Formulation: With patient Time For Goal Achievement: 04/03/24 Potential to Achieve Goals: Good Progress towards PT goals: Progressing toward goals    Frequency    7X/week      PT Plan      Co-evaluation              AM-PAC PT 6 Clicks Mobility   Outcome Measure  Help needed turning from your back to your side while in a flat bed without using bedrails?: A Little Help needed moving from lying on your back to sitting on the side of a flat bed without using bedrails?: A Little Help needed moving to and from a bed to a chair (including a wheelchair)?: A Little Help needed standing up from a chair using your arms (e.g., wheelchair or bedside chair)?: A Little Help needed to walk in hospital room?: A Little Help needed climbing 3-5 steps with a railing? : Total 6 Click Score: 16    End of Session Equipment Utilized During Treatment: Gait belt Activity Tolerance: Patient tolerated treatment well;Patient limited by fatigue Patient left: with call bell/phone within reach;with chair alarm set;in bed Nurse Communication: Mobility status PT Visit Diagnosis: Difficulty in walking, not elsewhere classified (R26.2)     Time: 9171-9144 PT Time Calculation (min) (ACUTE ONLY): 27 min  Charges:    $Gait Training: 8-22 mins $Therapeutic Activity: 8-22 mins PT General Charges $$ ACUTE PT VISIT: 1 Visit                     [facility] PT Acute Rehabilitation Services Office [phone]    August Longest 03/28/2024, 1:32 PM

## 2024-03-28 NOTE — Evaluation (Signed)
 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  Pt with hx of CKD, Bil THR, and bipolar     Clinical Impressions Pt at PLOF lives alone and reported they can get some assist with the return to home but only for 1-2 days. Pt at this time is very nervous about going home with current brace and WB status to be able to care for self. Pt is able to report 2/3 precautions in session and is able to follow cues in session. At this time she requires CGA to min assist for sit to stand transfers and CGA with mobility with RW. At this time needs max assist with LB ADLS and with peri care when in as standing position. At this time voiced the concern about having increase in assist if returning to home. Pt may need further rehab if they are not able to get more assistance with the return to home.     If plan is discharge home, recommend the following:   A little help with walking and/or transfers;A lot of help with bathing/dressing/bathroom;Assistance with cooking/housework;Assist for transportation;Help with stairs or ramp for entrance     Functional Status Assessment   Patient has had a recent decline in their functional status and demonstrates the ability to make significant improvements in function in a reasonable and predictable amount of time.     Equipment Recommendations   Tub/shower seat     Recommendations for Other Services         Precautions/Restrictions   Precautions Precautions: Fall;Posterior Hip Precaution Booklet Issued: Yes (comment) Recall of Precautions/Restrictions: Intact Required Braces or Orthoses: Other Brace (L Hip abduction brace (spoke to nursing about order clarfication on when it can come off)) Restrictions Weight Bearing Restrictions Per Provider Order: Yes LLE Weight Bearing Per Provider Order:  Touchdown weight bearing Other Position/Activity Restrictions: 0-10     Mobility Bed Mobility               General bed mobility comments: Pt presented in chair    Transfers Overall transfer level: Needs assistance Equipment used: Rolling walker (2 wheels) Transfers: Sit to/from Stand Sit to Stand: Contact guard assist, Min assist           General transfer comment: cues on first attempt with transfer but on second attempt was able to recall      Balance Overall balance assessment: Needs assistance Sitting-balance support: Feet supported, No upper extremity supported Sitting balance-Leahy Scale: Fair     Standing balance support: Bilateral upper extremity supported Standing balance-Leahy Scale: Poor Standing balance comment: reliant on RW                           ADL either performed or assessed with clinical judgement   ADL Overall ADL's : Needs assistance/impaired Eating/Feeding: Sitting;Independent   Grooming: Set up;Sitting   Upper Body Bathing: Set up;Sitting   Lower Body Bathing: Maximal assistance;Sit to/from stand   Upper Body Dressing : Set up;Sitting   Lower Body Dressing: Maximal assistance;Sit to/from stand   Toilet Transfer: Contact guard assist;Cueing for safety;Cueing for sequencing;Rolling walker (2 wheels);Ambulation   Toileting- Clothing Manipulation and Hygiene: Moderate assistance;Maximal assistance;Sit to/from stand       Functional mobility during ADLs: Contact guard assist;Rolling walker (2 wheels)       Vision  Baseline Vision/History: 1 Wears glasses Ability to See in Adequate Light: 0 Adequate Patient Visual Report: No change from baseline Vision Assessment?: Wears glasses for driving     Perception         Praxis         Pertinent Vitals/Pain Pain Assessment Pain Assessment: 0-10 Pain Score: 6  Pain Location: L hip Pain Descriptors / Indicators: Aching, Sore Pain Intervention(s): Limited activity  within patient's tolerance, Monitored during session, Repositioned, Ice applied, Patient requesting pain meds-RN notified     Extremity/Trunk Assessment Upper Extremity Assessment Upper Extremity Assessment: Overall WFL for tasks assessed   Lower Extremity Assessment Lower Extremity Assessment: Defer to PT evaluation   Cervical / Trunk Assessment Cervical / Trunk Assessment: Normal   Communication Communication Communication: No apparent difficulties   Cognition Arousal: Alert Behavior During Therapy: WFL for tasks assessed/performed Cognition: No apparent impairments                               Following commands: Intact       Cueing  General Comments   Cueing Techniques: Verbal cues      Exercises     Shoulder Instructions      Home Living Family/patient expects to be discharged to:: Private residence Living Arrangements: Alone Available Help at Discharge: Friend(s);Personal care attendant;Available 24 hours/day (per pt only for a day or two) Type of Home: House Home Access: Level entry     Home Layout: One level     Bathroom Shower/Tub: Producer, television/film/video: Handicapped height Bathroom Accessibility: Yes   Home Equipment: Agricultural consultant (2 wheels);Cane - single point;BSC/3in1          Prior Functioning/Environment Prior Level of Function : Independent/Modified Independent                    OT Problem List: Decreased strength;Decreased activity tolerance;Impaired balance (sitting and/or standing);Decreased safety awareness;Decreased knowledge of use of DME or AE;Pain   OT Treatment/Interventions: Self-care/ADL training;Therapeutic exercise;Therapeutic activities;Patient/family education;Balance training      OT Goals(Current goals can be found in the care plan section)   Acute Rehab OT Goals Patient Stated Goal: to get better OT Goal Formulation: With patient Time For Goal Achievement: 04/11/24 Potential to  Achieve Goals: Good   OT Frequency:  Min 2X/week    Co-evaluation              AM-PAC OT 6 Clicks Daily Activity     Outcome Measure Help from another person eating meals?: None Help from another person taking care of personal grooming?: A Little Help from another person toileting, which includes using toliet, bedpan, or urinal?: A Lot Help from another person bathing (including washing, rinsing, drying)?: A Lot Help from another person to put on and taking off regular upper body clothing?: A Little Help from another person to put on and taking off regular lower body clothing?: A Lot 6 Click Score: 16   End of Session Equipment Utilized During Treatment: Gait belt;Rolling walker (2 wheels) Nurse Communication: Mobility status;Other (comment) (pain, concern about dc to home)  Activity Tolerance: Patient tolerated treatment well Patient left: in chair;with call bell/phone within reach  OT Visit Diagnosis: Unsteadiness on feet (R26.81);Other abnormalities of gait and mobility (R26.89);Repeated falls (R29.6);Muscle weakness (generalized) (M62.81);History of falling (Z91.81);Pain Pain - Right/Left: Left Pain - part of body: Hip  Time: 1221-1252 OT Time Calculation (min): 31 min Charges:  OT General Charges $OT Visit: 1 Visit OT Evaluation $OT Eval Low Complexity: 1 Low OT Treatments $Self Care/Home Management : 8-22 mins  Warrick POUR OTR/L  Acute Rehab Services  [phone] office number   Warrick Berber 03/28/2024, 1:01 PM

## 2024-03-28 NOTE — Progress Notes (Signed)
   Subjective: 2 Days Post-Op Procedure(s) (LRB): REVISION, TOTAL ARTHROPLASTY, HIP, ANTERIOR APPROACH (Left)  Patient doing well but feels like her left thigh/quad is still asleep Having difficulty with left thigh flexion Denies any numbness or tingling distally  Therapy going fair thus far Awaiting improvement with therapy before d/c Patient reports pain as mild.  Objective:   VITALS:   Vitals:   03/27/24 2154 03/28/24 0614  BP: (!) 154/74 118/60  Pulse: 69 65  Resp: 16 16  Temp: 97.6 F (36.4 C) 98.4 F (36.9 C)  SpO2: 100% 99%    Left hip incision healing well Nv intact distally Lag with quad activation No signs of edema, infection or dvt  LABS Recent Labs    03/26/24 1458 03/27/24 0322 03/28/24 0420  HGB 7.5* 9.6* 7.8*  HCT 22.0* 28.1* 23.3*  WBC  --  9.0 7.8  PLT  --  171 147*    Recent Labs    03/26/24 1458 03/27/24 0322 03/28/24 0420  NA 140 137 135  K 3.9 4.2 3.7  BUN  --  31* 31*  CREATININE  --  1.11* 1.19*  GLUCOSE  --  115* 102*     Assessment/Plan: 2 Days Post-Op Procedure(s) (LRB): REVISION, TOTAL ARTHROPLASTY, HIP, ANTERIOR APPROACH (Left) Acute blood loss anemia - will transfuse again to optimize prior to d/c  May d/c once she improves with physical therapy Pain management  Pulmonary toilet     Brad Melvenia RIGGERS, MPAS [facility] is now [facility]  [place] [address], [address], [place], [place] [address] Phone: [phone] [email or web address] Facebook  [employer]

## 2024-03-28 NOTE — Progress Notes (Signed)
 Patients IV access for blood admin infiltrated at 1050. Promptly stopped blood admin and removed IV in L hand. Patient oriented x4 and states that she feels fine'. Spoke with Adine Fireman, PA @ [phone], no new orders. Directed to hold off on second unit of blood for now. Will continue to monitor.

## 2024-03-28 NOTE — Progress Notes (Addendum)
 Physical Therapy Treatment Patient Details Name: Heather Gray MRN: [id number] DOB: 02-13-1951 Today's Date: 03/28/2024   History of Present Illness Pt s/p L THR acetabular revision 2* recalled component.  Pt with hx of CKD, Bil THR, and bipolar    PT Comments  Pt continues very cooperative but with ++fatigue.  Pt assisted up from chair to ambulate limited distance for transfer to bed.  Pt positioned for comfort, ice packs provided, RN aware. Pt states her limitations and level of assist needed post op is far more than she anticipated.  Pt states she is attempting to secure additional assist prior to dc.   If plan is discharge home, recommend the following: A little help with walking and/or transfers;A little help with bathing/dressing/bathroom;Assistance with cooking/housework;Assist for transportation;Help with stairs or ramp for entrance   Can travel by private vehicle        Equipment Recommendations  None recommended by PT    Recommendations for Other Services       Precautions / Restrictions Precautions Precautions: Fall;Posterior Hip Precaution Booklet Issued: Yes (comment) Recall of Precautions/Restrictions: Intact Required Braces or Orthoses: Other Brace Other Brace: Abductor brace Restrictions Weight Bearing Restrictions Per Provider Order: Yes LLE Weight Bearing Per Provider Order: Touchdown weight bearing     Mobility  Bed Mobility Overal bed mobility: Needs Assistance Bed Mobility: Rolling, Sit to Supine Rolling: Min assist   Supine to sit: Min assist Sit to supine: Min assist   General bed mobility comments: Min assist to place pillow between LEs and assist with log roll for removal of Abductor brace.  Increased time and min assist to manage L LE with move sit to supine    Transfers Overall transfer level: Needs assistance Equipment used: Rolling walker (2 wheels) Transfers: Sit to/from Stand Sit to Stand: Contact guard assist, Min assist            General transfer comment: cues for LE management, adherence to THP and use of UEs to self assist    Ambulation/Gait Ambulation/Gait assistance: Min assist Gait Distance (Feet): 5 Feet Assistive device: Rolling walker (2 wheels) Gait Pattern/deviations: Step-to pattern, Decreased step length - right, Decreased step length - left, Shuffle, Trunk flexed Gait velocity: decr     General Gait Details: cues for sequence, posture and position from RW; distance ltd by fatigue but good follow through with TWB on L   Stairs             Wheelchair Mobility     Tilt Bed    Modified Rankin (Stroke Patients Only)       Balance Overall balance assessment: Needs assistance Sitting-balance support: Feet supported, No upper extremity supported Sitting balance-Leahy Scale: Fair     Standing balance support: Bilateral upper extremity supported Standing balance-Leahy Scale: Poor Standing balance comment: reliant on RW                            Communication Communication Communication: No apparent difficulties  Cognition Arousal: Alert Behavior During Therapy: WFL for tasks assessed/performed   PT - Cognitive impairments: No apparent impairments                         Following commands: Intact      Cueing Cueing Techniques: Verbal cues  Exercises      General Comments        Pertinent Vitals/Pain Pain Assessment Pain Assessment: 0-10 Pain Score:  6  Pain Location: L hip Pain Descriptors / Indicators: Aching, Sore Pain Intervention(s): Limited activity within patient's tolerance, Monitored during session, Premedicated before session, Ice applied    Home Living Family/patient expects to be discharged to:: Private residence Living Arrangements: Alone Available Help at Discharge: Friend(s);Personal care attendant;Available 24 hours/day (per pt only for a day or two) Type of Home: House Home Access: Level entry       Home Layout: One  level Home Equipment: Agricultural consultant (2 wheels);Cane - single point;BSC/3in1      Prior Function            PT Goals (current goals can now be found in the care plan section) Acute Rehab PT Goals Patient Stated Goal: Regain IND PT Goal Formulation: With patient Time For Goal Achievement: 04/03/24 Potential to Achieve Goals: Good Progress towards PT goals: Not progressing toward goals - comment (++fatigue)    Frequency    7X/week      PT Plan      Co-evaluation              AM-PAC PT 6 Clicks Mobility   Outcome Measure  Help needed turning from your back to your side while in a flat bed without using bedrails?: A Little Help needed moving from lying on your back to sitting on the side of a flat bed without using bedrails?: A Little Help needed moving to and from a bed to a chair (including a wheelchair)?: A Little Help needed standing up from a chair using your arms (e.g., wheelchair or bedside chair)?: A Little Help needed to walk in hospital room?: A Lot Help needed climbing 3-5 steps with a railing? : Total 6 Click Score: 15    End of Session Equipment Utilized During Treatment: Gait belt Activity Tolerance: Patient tolerated treatment well;Patient limited by fatigue Patient left: with call bell/phone within reach;with chair alarm set;in bed Nurse Communication: Mobility status PT Visit Diagnosis: Difficulty in walking, not elsewhere classified (R26.2)     Time: 1426-1440 PT Time Calculation (min) (ACUTE ONLY): 14 min  Charges:    $Gait Training: 8-22 mins $Therapeutic Activity: 8-22 mins PT General Charges $$ ACUTE PT VISIT: 1 Visit                     [facility] PT Acute Rehabilitation Services Office [phone]    [facility] 03/28/2024, 3:49 PM

## 2024-03-29 LAB — HEMOGLOBIN AND HEMATOCRIT, BLOOD
HCT: 24.9 % — ABNORMAL LOW (ref 36.0–46.0)
Hemoglobin: 7.7 g/dL — ABNORMAL LOW (ref 12.0–15.0)

## 2024-03-29 NOTE — Progress Notes (Addendum)
 Patient ID: Heather Gray, female   DOB: [birth year], 73 y.o.   MRN: [id number] Subjective: 3 Days Post-Op Procedure(s) (LRB): REVISION, TOTAL ARTHROPLASTY, HIP, ANTERIOR APPROACH (Left)    Patient reports pain as moderate. Still challenged more than anticipated with left LE movement Abduction brace ordered but uncertain of reason to explain to her  Objective:   VITALS:   Vitals:   03/28/24 2147 03/29/24 0556  BP: 139/66 (!) 103/53  Pulse: 70 69  Resp: 18 17  Temp: 97.8 F (36.6 C) 98.7 F (37.1 C)  SpO2: 99% 98%    Neurovascular intact Incision: dressing C/D/I - left hip Able to move LLE but limited  LABS Recent Labs    03/26/24 1458 03/27/24 0322 03/28/24 0420  HGB 7.5* 9.6* 7.8*  HCT 22.0* 28.1* 23.3*  WBC  --  9.0 7.8  PLT  --  171 147*    Recent Labs    03/26/24 1458 03/27/24 0322 03/28/24 0420  NA 140 137 135  K 3.9 4.2 3.7  BUN  --  31* 31*  CREATININE  --  1.11* 1.19*  GLUCOSE  --  115* 102*    No results for input(s): LABPT, INR in the last 72 hours.   Assessment/Plan: 3 Days Post-Op Procedure(s) (LRB): REVISION, TOTAL ARTHROPLASTY, HIP, ANTERIOR APPROACH (Left)   Up with therapy Here today to work with therapy She doesn't have anyone home to help this weekend but thinks that she she can have some help tomorrow. Swinteck team to review need for brace and duration  Apparently was supposed to receive a transfusion but her IV failed  We will place order to attempt today

## 2024-03-29 NOTE — Progress Notes (Signed)
 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  Pt with hx of CKD, Bil THR, and bipolar    PT Comments  Pt continues cooperative and performing most tasks at min assist level with increased time but continues ltd by fatigue.  This pm, pt received on BSC reporting I feel so hot, I just need to lay down - BP 149/81.  Pt assisted to standing, to ambulate limited distance to bedside and assisted to bed and with bed mobility tasks required for removal of Abductor brace and to position for comfort.  RN in to assess.    If plan is discharge home, recommend the following: A little help with walking and/or transfers;A little help with bathing/dressing/bathroom;Assistance with cooking/housework;Assist for transportation;Help with stairs or ramp for entrance   Can travel by private vehicle        Equipment Recommendations  None recommended by PT    Recommendations for Other Services       Precautions / Restrictions Precautions Precautions: Fall;Posterior Hip Precaution Booklet Issued: Yes (comment) Recall of Precautions/Restrictions: Intact Required Braces or Orthoses: Other Brace Other Brace: Abductor brace Restrictions Weight Bearing Restrictions Per Provider Order: Yes LLE Weight Bearing Per Provider Order: Touchdown weight bearing     Mobility  Bed Mobility Overal bed mobility: Needs Assistance Bed Mobility: Rolling, Sit to Supine Rolling: Min assist   Supine to sit: Min assist Sit to supine: Min assist   General bed mobility comments: Min assist to place pillow between LEs and assist with log roll for removal of Abductor brace.  Increased time and min assist to manage L LE with move supine to sit    Transfers Overall transfer level: Needs assistance Equipment used: Rolling walker (2 wheels) Transfers: Sit to/from Stand Sit  to Stand: Contact guard assist, Min assist           General transfer comment: min cues for LE management, adherence to THP and use of UEs to self assist    Ambulation/Gait Ambulation/Gait assistance: Min assist Gait Distance (Feet): 7 Feet Assistive device: Rolling walker (2 wheels) Gait Pattern/deviations: Step-to pattern, Decreased step length - right, Decreased step length - left, Shuffle, Trunk flexed Gait velocity: decr     General Gait Details: cues for sequence, posture and position from RW; distance ltd by fatigue but good follow through with TWB on L   Stairs             Wheelchair Mobility     Tilt Bed    Modified Rankin (Stroke Patients Only)       Balance Overall balance assessment: Needs assistance Sitting-balance support: Feet supported, No upper extremity supported Sitting balance-Leahy Scale: Good     Standing balance support: Bilateral upper extremity supported Standing balance-Leahy Scale: Poor Standing balance comment: reliant on RW                            Communication Communication Communication: No apparent difficulties  Cognition Arousal: Alert Behavior During Therapy: WFL for tasks assessed/performed   PT - Cognitive impairments: No apparent impairments                         Following commands: Intact      Cueing Cueing Techniques: Verbal cues  Exercises Total Joint Exercises Ankle Circles/Pumps: AROM,  Both, 15 reps, Supine Quad Sets: AROM, Both, 10 reps, Supine Heel Slides: AAROM, Left, Supine, 20 reps Hip ABduction/ADduction: AAROM, Left, 10 reps, Supine    General Comments        Pertinent Vitals/Pain Pain Assessment Pain Assessment: 0-10 Pain Score: 4  Pain Location: L hip Pain Descriptors / Indicators: Aching, Sore Pain Intervention(s): Limited activity within patient's tolerance, Monitored during session, Premedicated before session, Ice applied    Home Living                           Prior Function            PT Goals (current goals can now be found in the care plan section) Acute Rehab PT Goals Patient Stated Goal: Regain IND PT Goal Formulation: With patient Time For Goal Achievement: 04/03/24 Potential to Achieve Goals: Good Progress towards PT goals: Progressing toward goals    Frequency    7X/week      PT Plan      Co-evaluation              AM-PAC PT 6 Clicks Mobility   Outcome Measure  Help needed turning from your back to your side while in a flat bed without using bedrails?: A Little Help needed moving from lying on your back to sitting on the side of a flat bed without using bedrails?: A Little Help needed moving to and from a bed to a chair (including a wheelchair)?: A Little Help needed standing up from a chair using your arms (e.g., wheelchair or bedside chair)?: A Little Help needed to walk in hospital room?: A Lot Help needed climbing 3-5 steps with a railing? : Total 6 Click Score: 15    End of Session Equipment Utilized During Treatment: Gait belt Activity Tolerance: Patient limited by fatigue Patient left: in bed;with call bell/phone within reach;with bed alarm set Nurse Communication: Mobility status PT Visit Diagnosis: Difficulty in walking, not elsewhere classified (R26.2)     Time: 8499-8478 PT Time Calculation (min) (ACUTE ONLY): 21 min  Charges:    $Therapeutic Activity: 8-22 mins PT General Charges $$ ACUTE PT VISIT: 1 Visit                     [facility] PT Acute Rehabilitation Services Office [phone]    [facility] 03/29/2024, 4:33 PM

## 2024-03-29 NOTE — Progress Notes (Signed)
 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  Pt with hx of CKD, Bil THR, and bipolar    PT Comments  Pt continues very cooperative but progress limited by fatigue with activity.  Pt requires assist for all mobility tasks and extensive assist for application of abductor brace.  Pt states she is still looking for additional assist at home.  Patient may benefit from continued inpatient follow up therapy, <3 hours/day to maximize IND and safety prior to dc home unless appropriate assist level is in place.     If plan is discharge home, recommend the following: A little help with walking and/or transfers;A little help with bathing/dressing/bathroom;Assistance with cooking/housework;Assist for transportation;Help with stairs or ramp for entrance   Can travel by private vehicle        Equipment Recommendations  None recommended by PT    Recommendations for Other Services       Precautions / Restrictions Precautions Precautions: Fall;Posterior Hip Precaution Booklet Issued: Yes (comment) Recall of Precautions/Restrictions: Intact Required Braces or Orthoses: Other Brace Other Brace: Abductor brace Restrictions Weight Bearing Restrictions Per Provider Order: Yes LLE Weight Bearing Per Provider Order: Touchdown weight bearing     Mobility  Bed Mobility Overal bed mobility: Needs Assistance Bed Mobility: Rolling, Sit to Supine Rolling: Min assist   Supine to sit: Min assist     General bed mobility comments: Min assist to place pillow between LEs and assist with log roll for application of Abductor brace.  Increased time and min assist to manage L LE with move sit to supine    Transfers Overall transfer level: Needs assistance Equipment used: Rolling walker (2 wheels) Transfers: Sit to/from Stand Sit to Stand: Contact guard  assist, Min assist           General transfer comment: cues for LE management, adherence to THP and use of UEs to self assist    Ambulation/Gait Ambulation/Gait assistance: Min assist Gait Distance (Feet): 14 Feet Assistive device: Rolling walker (2 wheels) Gait Pattern/deviations: Step-to pattern, Decreased step length - right, Decreased step length - left, Shuffle, Trunk flexed Gait velocity: decr     General Gait Details: cues for sequence, posture and position from RW; distance ltd by fatigue but good follow through with TWB on L   Stairs             Wheelchair Mobility     Tilt Bed    Modified Rankin (Stroke Patients Only)       Balance Overall balance assessment: Needs assistance Sitting-balance support: Feet supported, No upper extremity supported Sitting balance-Leahy Scale: Good     Standing balance support: Bilateral upper extremity supported Standing balance-Leahy Scale: Poor Standing balance comment: reliant on RW                            Communication Communication Communication: No apparent difficulties  Cognition Arousal: Alert Behavior During Therapy: WFL for tasks assessed/performed   PT - Cognitive impairments: No apparent impairments                         Following commands: Intact      Cueing Cueing Techniques: Verbal cues  Exercises Total Joint Exercises Ankle Circles/Pumps: AROM, Both, 15 reps, Supine Quad Sets: AROM, Both, 10 reps, Supine Heel  Slides: AAROM, Left, Supine, 20 reps Hip ABduction/ADduction: AAROM, Left, 10 reps, Supine    General Comments        Pertinent Vitals/Pain Pain Assessment Pain Assessment: 0-10 Pain Score: 5  Pain Location: L hip Pain Descriptors / Indicators: Aching, Sore Pain Intervention(s): Limited activity within patient's tolerance, Monitored during session, Premedicated before session, Ice applied    Home Living                          Prior  Function            PT Goals (current goals can now be found in the care plan section) Acute Rehab PT Goals Patient Stated Goal: Regain IND PT Goal Formulation: With patient Time For Goal Achievement: 04/03/24 Potential to Achieve Goals: Good Progress towards PT goals: Progressing toward goals    Frequency    7X/week      PT Plan      Co-evaluation              AM-PAC PT 6 Clicks Mobility   Outcome Measure  Help needed turning from your back to your side while in a flat bed without using bedrails?: A Little Help needed moving from lying on your back to sitting on the side of a flat bed without using bedrails?: A Little Help needed moving to and from a bed to a chair (including a wheelchair)?: A Little Help needed standing up from a chair using your arms (e.g., wheelchair or bedside chair)?: A Little Help needed to walk in hospital room?: A Lot Help needed climbing 3-5 steps with a railing? : Total 6 Click Score: 15    End of Session Equipment Utilized During Treatment: Gait belt Activity Tolerance: Patient tolerated treatment well;Patient limited by fatigue Patient left: with call bell/phone within reach;with chair alarm set;in chair Nurse Communication: Mobility status PT Visit Diagnosis: Difficulty in walking, not elsewhere classified (R26.2)     Time: 9087-9056 PT Time Calculation (min) (ACUTE ONLY): 31 min  Charges:    $Gait Training: 8-22 mins $Therapeutic Exercise: 8-22 mins PT General Charges $$ ACUTE PT VISIT: 1 Visit                     [facility] PT Acute Rehabilitation Services Office [phone]    [facility] 03/29/2024, 11:48 AM

## 2024-03-30 ENCOUNTER — Encounter (HOSPITAL_COMMUNITY): Payer: Self-pay | Admitting: Orthopedic Surgery

## 2024-03-30 LAB — TYPE AND SCREEN
ABO/RH(D): A POS
Antibody Screen: NEGATIVE
Unit division: 0
Unit division: 0
Unit division: 0
Unit division: 0

## 2024-03-30 LAB — BPAM RBC
Blood Product Expiration Date: 202508272359
Blood Product Expiration Date: 202508272359
Blood Product Expiration Date: 202508312359
ISSUE DATE / TIME: 202507311515
ISSUE DATE / TIME: 202507311515
ISSUE DATE / TIME: 202508020945
ISSUE DATE / TIME: 202508040952
ISSUE DATE / TIME: 202508312359
Unit Type and Rh: 202508312359
Unit Type and Rh: 202508312359
Unit Type and Rh: 6200
Unit Type and Rh: 6200
Unit Type and Rh: 6200
Unit Type and Rh: 6200

## 2024-03-30 LAB — BASIC METABOLIC PANEL WITH GFR
Anion gap: 9 (ref 5–15)
BUN: 21 mg/dL (ref 8–23)
CO2: 23 mmol/L (ref 22–32)
Calcium: 8.7 mg/dL — ABNORMAL LOW (ref 8.9–10.3)
Chloride: 107 mmol/L (ref 98–111)
Creatinine, Ser: 0.86 mg/dL (ref 0.44–1.00)
GFR, Estimated: >60 mL/min (ref >60)
Glucose, Bld: 154 mg/dL — ABNORMAL HIGH (ref 70–99)
Potassium: 3.8 mmol/L (ref 3.5–5.1)
Sodium: 139 mmol/L (ref 135–145)

## 2024-03-30 LAB — HEMOGLOBIN AND HEMATOCRIT, BLOOD
HCT: 25 % — ABNORMAL LOW (ref 36.0–46.0)
Hemoglobin: 7.8 g/dL — ABNORMAL LOW (ref 12.0–15.0)

## 2024-03-30 MED ORDER — ALPRAZOLAM 0.5 MG PO TABS: 0.7500 mg | ORAL_TABLET | Freq: Every evening | ORAL | 0 refills | Status: AC

## 2024-03-30 MED ORDER — HYDROCODONE-ACETAMINOPHEN 5-325 MG PO TABS: 1.0000 | ORAL_TABLET | ORAL | 0 refills | Status: AC | PRN

## 2024-03-30 NOTE — Anesthesia Postprocedure Evaluation (Signed)
 Anesthesia Post Note  Patient: Heather Gray  Procedure(s) Performed: REVISION, TOTAL ARTHROPLASTY, HIP, ANTERIOR APPROACH (Left: Hip)     Patient location during evaluation: PACU Anesthesia Type: General Level of consciousness: awake and alert Pain management: pain level controlled Vital Signs Assessment: post-procedure vital signs reviewed and stable Respiratory status: spontaneous breathing, nonlabored ventilation, respiratory function stable and patient connected to nasal cannula oxygen Cardiovascular status: blood pressure returned to baseline and stable Postop Assessment: no apparent nausea or vomiting Anesthetic complications: no   No notable events documented.  Last Vitals:  Vitals:   03/29/24 2226 03/30/24 0700  BP: 134/74 (!) 104/55  Pulse: (!) 59 64  Resp: 16 16  Temp: 36.8 C 36.8 C  SpO2: 99% 99%    Last Pain:  Vitals:   03/30/24 0631  TempSrc:   PainSc: 2    Pain Goal:                   Cyncere Ruhe L Donyale Falcon

## 2024-03-30 NOTE — Discharge Summary (Signed)
 Physician Discharge Summary  Patient ID: Heather Gray MRN: [id number] DOB/AGE: 11/12/1950 73 y.o.  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    She was given perioperative antibiotics:  Anti-infectives (From admission, onward)    Start     Dose/Rate Route Frequency Ordered Stop   03/26/24 1815  ceFAZolin  (ANCEF ) IVPB 2g/100 mL premix        2 g 200 mL/hr over 30 Minutes Intravenous Every 6 hours 03/26/24 1722 03/27/24 1300   03/26/24 1240  vancomycin  (VANCOCIN ) 1-5 GM/200ML-% IVPB       Note to Pharmacy: [place], [place] H: cabinet override      03/26/24 1240 03/27/24 0044   03/26/24 0815  ceFAZolin  (ANCEF ) IVPB 2g/100 mL premix        2 g 200 mL/hr over 30 Minutes Intravenous On call to O.R. 03/26/24 0801 03/26/24 1237       Postop WB status is TDWB LLE with walker. Abduction brace while OOB.  Posterior hip precautions.  She was given sequential compression devices, early ambulation, and ASA for DVT prophylaxis.  ON POD#2, she received a partial  unit PRBCs for ABLA and then her IV infiltrated.  Hgb stabilized > 7, and she was medically stable for discharge.  She benefited maximally from the hospital stay and there were no complications.    Recent vital signs:  Vitals:   03/31/24 0522 03/31/24 1316  BP: (!) 98/54 131/71  Pulse: 67 65  Resp: 16 16  Temp: 98.2 F (36.8 C) 98 F (36.7 C)  SpO2: 99% 100%    Recent laboratory studies:  Lab Results  Component Value Date   HGB 7.3 (L) 03/31/2024   HGB 7.8 (L) 03/30/2024   HGB 7.7 (L) 03/29/2024   Lab Results  Component Value Date   WBC 7.8 03/28/2024   PLT 147 (L) 03/28/2024   Lab Results  Component Value Date   INR 1.0 06/17/2020   Lab Results  Component Value Date   NA 139 03/30/2024   K 3.8 03/30/2024   CL 107 03/30/2024   CO2 23 03/30/2024   BUN 21 03/30/2024   CREATININE 0.86 03/30/2024   GLUCOSE 154 (H) 03/30/2024     Allergies as of 03/31/2024       Reactions   Tetracyclines & Related Shortness Of Breath, Palpitations   Sulfa Antibiotics Other (See Comments)   Leg cramping        Medication List  STOP taking these medications    alendronate 70 MG tablet Commonly known as: FOSAMAX   Eszopiclone  3 MG Tabs   traMADol 50 MG tablet Commonly known as: ULTRAM       TAKE these medications    ALPRAZolam  0.5 MG tablet Commonly known as: XANAX  Take 1.5 tablets (0.75 mg total) by mouth at bedtime.   amLODipine 5 MG tablet Commonly known as: NORVASC Take 5 mg by mouth daily.   aspirin  81 MG chewable tablet Commonly known as: Aspirin  Childrens Chew 1 tablet (81 mg total) by mouth 2 (two) times daily with a meal.   CALCIUM 1200+D3 PO Take 1 tablet by mouth daily.   chlorhexidine  4 % external liquid Commonly known as: HIBICLENS  Apply 15 mLs (1 Application total) topically as directed for 30 doses. Use as directed daily for 5 days every other week for 6 weeks.   docusate sodium  100 MG capsule Commonly known as: Colace Take 1 capsule  (100 mg total) by mouth 2 (two) times daily.   hydrochlorothiazide 25 MG tablet Commonly known as: HYDRODIURIL Take 25 mg by mouth daily.   HYDROcodone -acetaminophen  5-325 MG tablet Commonly known as: NORCO/VICODIN Take 1 tablet by mouth every 4 (four) hours as needed for up to 7 days for moderate pain (pain score 4-6) or severe pain (pain score 7-10).   LaMICtal  200 MG tablet Generic drug: lamoTRIgine  Take 200 mg by mouth every morning.   losartan  100 MG tablet Commonly known as: COZAAR  Take 100 mg by mouth daily.   Magnesium  400 MG Tabs Take 400 mg by mouth at bedtime.   mupirocin  ointment 2 % Commonly known as: BACTROBAN  Place 1 Application into the nose 2 (two) times daily for 60 doses. Use as directed 2 times daily for 5 days every other week for 6 weeks.   ondansetron  4 MG tablet Commonly known as: Zofran  Take 1 tablet (4 mg total) by mouth every 8 (eight) hours as needed for nausea or vomiting.   polyethylene glycol 17 g packet Commonly known as: MiraLax  Take 17 g by mouth daily as needed for mild constipation or moderate constipation.   QUEtiapine  300 MG tablet Commonly known as: SEROQUEL  Take 300 mg by mouth at bedtime.   QUEtiapine  50 MG tablet Commonly known as: SEROQUEL  Take 50 mg by mouth at bedtime.   rizatriptan 10 MG tablet Commonly known as: MAXALT Take 10 mg by mouth as needed for migraine. May repeat in 2 hours if needed   senna 8.6 MG Tabs tablet Commonly known as: SENOKOT Take 2 tablets (17.2 mg total) by mouth at bedtime for 15 days.   topiramate  200 MG tablet Commonly known as: TOPAMAX  Take 200 mg by mouth at bedtime.               Discharge Care Instructions  (From admission, onward)           Start     Ordered   03/31/24 0000  Change dressing       Comments: Do not change your dressing.   03/31/24 1436   03/31/24 0000  Touch down weight bearing        03/31/24 1436              WEIGHT BEARING   Partial weight  bearing with assist device as directed.  TDWB LLE with walker. Posterior hip precautions.   EXERCISES  Results after joint replacement surgery are often greatly improved when you follow the exercise, range of motion and  muscle strengthening exercises prescribed by your doctor. Safety measures are also important to protect the joint from further injury. Any time any of these exercises cause you to have increased pain or swelling, decrease what you are doing until you are comfortable again and then slowly increase them. If you have problems or questions, call your caregiver or physical therapist for advice.   Rehabilitation is important following a joint replacement. After just a few days of immobilization, the muscles of the leg can become weakened and shrink (atrophy).  These exercises are designed to build up the tone and strength of the thigh and leg muscles and to improve motion. Often times heat used for twenty to thirty minutes before working out will loosen up your tissues and help with improving the range of motion but do not use heat for the first two weeks following surgery (sometimes heat can increase post-operative swelling).   These exercises can be done on a training (exercise) mat, on the floor, on a table or on a bed. Use whatever works the best and is most comfortable for you.    Use music or television while you are exercising so that the exercises are a pleasant break in your day. This will make your life better with the exercises acting as a break in your routine that you can look forward to.   Perform all exercises about fifteen times, three times per day or as directed.  You should exercise both the operative leg and the other leg as well.  Exercises include:   Quad Sets - Tighten up the muscle on the front of the thigh (Quad) and hold for 5-10 seconds.   Leg Slides: Lying on your back, slowly slide your foot toward your buttocks, bending your knee up off the floor (only go as far  as is comfortable). Then slowly slide your foot back down until your leg is flat on the floor again.  Angel Wings: Lying on your back spread your legs to the side as far apart as you can without causing discomfort.  Hamstring Strength:  Lying on your back, push your heel against the floor with your leg straight by tightening up the muscles of your buttocks.  Repeat, but this time bend your knee to a comfortable angle, and push your heel against the floor.  You may put a pillow under the heel to make it more comfortable if necessary.   A rehabilitation program following joint replacement surgery can speed recovery and prevent re-injury in the future due to weakened muscles. Contact your doctor or a physical therapist for more information on knee rehabilitation.    CONSTIPATION  Constipation is defined medically as fewer than three stools per week and severe constipation as less than one stool per week.  Even if you have a regular bowel pattern at home, your normal regimen is likely to be disrupted due to multiple reasons following surgery.  Combination of anesthesia, postoperative narcotics, change in appetite and fluid intake all can affect your bowels.   YOU MUST use at least one of the following options; they are listed in order of increasing strength to get the job done.  They are all available over the counter, and you may need to use some, POSSIBLY even all of these options:    Drink plenty of fluids (prune juice may be helpful) and high fiber foods Colace 100 mg by mouth twice a day  Senokot for constipation as directed and as needed Dulcolax (bisacodyl ), take with full  glass of water   Miralax  (polyethylene glycol) once or twice a day as needed.  If you have tried all these things and are unable to have a bowel movement in the first 3-4 days after surgery call either your surgeon or your primary doctor.    If you experience loose stools or diarrhea, hold the medications until you stool  forms back up.  If your symptoms do not get better within 1 week or if they get worse, check with your doctor.  If you experience the worst abdominal pain ever or develop nausea or vomiting, please contact the office immediately for further recommendations for treatment.   ITCHING:  If you experience itching with your medications, try taking only a single pain pill, or even half a pain pill at a time.  You can also use Benadryl  over the counter for itching or also to help with sleep.   TED HOSE STOCKINGS:  Use stockings on both legs until for at least 2 weeks or as directed by physician office. They may be removed at night for sleeping.  MEDICATIONS:  See your medication summary on the "After Visit Summary" that nursing will review with you.  You may have some home medications which will be placed on hold until you complete the course of blood thinner medication.  It is important for you to complete the blood thinner medication as prescribed.  PRECAUTIONS:  If you experience chest pain or shortness of breath - call 911 immediately for transfer to the hospital emergency department.   If you develop a fever greater that 101 F, purulent drainage from wound, increased redness or drainage from wound, foul odor from the wound/dressing, or calf pain - CONTACT YOUR SURGEON.                                                   FOLLOW-UP APPOINTMENTS:  If you do not already have a post-op appointment, please call the office for an appointment to be seen by your surgeon.  Guidelines for how soon to be seen are listed in your "After Visit Summary", but are typically between 1-4 weeks after surgery.  OTHER INSTRUCTIONS:   Knee Replacement:  Do not place pillow under knee, focus on keeping the knee straight while resting. CPM instructions: 0-90 degrees, 2 hours in the morning, 2 hours in the afternoon, and 2 hours in the evening. Place foam block, curve side up under heel at all times except when in CPM or when  walking.  DO NOT modify, tear, cut, or change the foam block in any way.   MAKE SURE YOU:  Understand these instructions.  Get help right away if you are not doing well or get worse.    Thank you for letting us  be a part of your medical care team.  It is a privilege we respect greatly.  We hope these instructions will help you stay on track for a fast and full recovery!    Disposition: Discharge disposition: 01-Home or Self Care       Discharge Instructions     Call MD / Call 911   Complete by: As directed    If you experience chest pain or shortness of breath, CALL 911 and be transported to the hospital emergency room.  If you develope a fever above 101 F, pus (white drainage) or increased  drainage or redness at the wound, or calf pain, call your surgeon's office.   Change dressing   Complete by: As directed    Do not change your dressing.   Constipation Prevention   Complete by: As directed    Drink plenty of fluids.  Prune juice may be helpful.  You may use a stool softener, such as Colace (over the counter) 100 mg twice a day.  Use MiraLax  (over the counter) for constipation as needed.   Diet - low sodium heart healthy   Complete by: As directed    Discharge instructions   Complete by: As directed    Elevate toes above nose. Use cryotherapy as needed for pain and swelling.  Posterior hip precautions.  Maintain hip abduction brace.   Driving restrictions   Complete by: As directed    No driving for 6 weeks   Follow the hip precautions as taught in Physical Therapy   Complete by: As directed    Lifting restrictions   Complete by: As directed    No lifting for 6 weeks   Post-operative opioid taper instructions:   Complete by: As directed    POST-OPERATIVE OPIOID TAPER INSTRUCTIONS: It is important to wean off of your opioid medication as soon as possible. If you do not need pain medication after your surgery it is ok to stop day one. Opioids include: Codeine,  Hydrocodone (Norco, Vicodin), Oxycodone (Percocet, oxycontin ) and hydromorphone  amongst others.  Long term and even short term use of opiods can cause: Increased pain response Dependence Constipation Depression Respiratory depression And more.  Withdrawal symptoms can include Flu like symptoms Nausea, vomiting And more Techniques to manage these symptoms Hydrate well Eat regular healthy meals Stay active Use relaxation techniques(deep breathing, meditating, yoga) Do Not substitute Alcohol  to help with tapering If you have been on opioids for less than two weeks and do not have pain than it is ok to stop all together.  Plan to wean off of opioids This plan should start within one week post op of your joint replacement. Maintain the same interval or time between taking each dose and first decrease the dose.  Cut the total daily intake of opioids by one tablet each day Next start to increase the time between doses. The last dose that should be eliminated is the evening dose.      TED hose   Complete by: As directed    Use stockings (TED hose) for 2 weeks on both leg(s).  You may remove them at night for sleeping.   Touch down weight bearing   Complete by: As directed         Follow-up Information     Heather Valery RAMAN, PA-C. Schedule an appointment as soon as possible for a visit in 2 week(s).   Specialty: Orthopedic Surgery Why: For suture removal, For wound re-check Contact information: [address] [place] [address] [phone]                  Signed: Redell PARAS Laurynn Mccorvey 03/31/2024, 2:36 PM

## 2024-03-30 NOTE — Progress Notes (Signed)
 Occupational Therapy Treatment Patient Details Name: Heather Gray MRN: [id number] DOB: September 04, 1950 Today's Date: 03/30/2024   History of present illness Pt s/p L THR acetabular revision 2* recalled component.  Pt with hx of CKD, Bil THR, and bipolar   OT comments  Patient progressing and showed improved LB ADL abilities once trained in uses of long handled adaptive equipment to compensate for posterior hip precautions, compared to previous session.  Pt just back from walking with PT so session limited to chair level ADLs with pt asking not to stand again due to post-ambulation fatigue. OT also had pt practice dressing techniques with adaptive equipment on RLE so as to allow LLE to rest and have pain come down. PT into room and provided ice packs.   Patient remains limited by post-op pain, LLE TTWB and post hip precautions, and LLE weakness along with deficits noted below. Pt continues to demonstrate good rehab potential and would benefit from continued skilled OT to increase safety and independence with ADLs and functional transfers to allow pt to return home safely and reduce caregiver burden and fall risk. Continued occupational therapy services after discharge from acute care from continued inpatient follow up therapy, <3 hours/day is recommended.        If plan is discharge home, recommend the following:  A little help with walking and/or transfers;A lot of help with bathing/dressing/bathroom;Assistance with cooking/housework;Assist for transportation;Help with stairs or ramp for entrance   Equipment Recommendations  Tub/shower seat    Recommendations for Other Services      Precautions / Restrictions         Mobility Bed Mobility               General bed mobility comments: Pt received in recliner with PT    Transfers                         Balance                                           ADL either performed or assessed with clinical  judgement   ADL Overall ADL's : Needs assistance/impaired Eating/Feeding: Sitting;Independent             Lower Body Bathing Details (indicate cue type and reason): Pt educated on use of long bath brush to reach lower legs as well as squeezing out and wrapping with towel to dry off as much as possible before eexiting shower. Pt cautioned to have someone with her when she performs her 1st home shower due to bathrooms and kitchen being common places falls occur. Pt agreeable to plan.     Lower Body Dressing: Sitting/lateral leans;Set up;Cueing for compensatory techniques;Adhering to hip precautions;With adaptive equipment;Supervision/safety;Cueing for sequencing Lower Body Dressing Details (indicate cue type and reason): Pt educated with OT demonstration, then pt demonstrated back: use of reacher to doff socks and don/doff underwear, pulling over knees but declined to practice standing to pull over hips due to fatigue having just worked with PT before OT to room. Pt also performed use of sock aid on RLE for practice and able to perform with supervision/setup only with AE. Pt shown how to use reacher to doff shoes to avoid hip ADDuction and educated on use of long shoe horn. Pt reports that she has ordered the hip kit but not yet received it.  Pt educated to make sure she uses heel sticker for best use and to sit ad lib while dressing due to TTWB and fall risk. Pt verbalized understanding.           Tub/Shower Transfer Details (indicate cue type and reason): Pt encougaed to work with OT in rehab on practicing walk in shower transfers with TTWB and to work with [facility] PT and OT on best compensatory techniuqes for transferring into shower at home.        Extremity/Trunk Assessment Upper Extremity Assessment Upper Extremity Assessment: Overall WFL for tasks assessed   Lower Extremity Assessment Lower Extremity Assessment: Defer to PT evaluation   Cervical / Trunk Assessment Cervical / Trunk  Assessment: Normal    Vision Ability to See in Adequate Light: 0 Adequate Patient Visual Report: No change from baseline Vision Assessment?: Wears glasses for driving   Perception     Praxis     Communication Communication Communication: No apparent difficulties   Cognition Arousal: Alert Behavior During Therapy: WFL for tasks assessed/performed Cognition: No apparent impairments                               Following commands: Intact        Cueing   Cueing Techniques: Verbal cues  Exercises Other Exercises Other Exercises: Pt able to identify 3/3 posterior hip precautions when asked as well as correct WB restriction.    Shoulder Instructions       General Comments      Pertinent Vitals/ Pain       Pain Assessment Pain Assessment: 0-10 Pain Score: 5  Pain Location: After working with PT Pain Intervention(s): Limited activity within patient's tolerance, RN gave pain meds during session, Monitored during session (Practiced AE/ADLs on RLE for practice and to allow LLE to rest.)  Home Living                                          Prior Functioning/Environment              Frequency  Min 2X/week        Progress Toward Goals  OT Goals(current goals can now be found in the care plan section)  Progress towards OT goals: Progressing toward goals  Acute Rehab OT Goals Patient Stated Goal: Go home asap after rehab to see her two cats. OT Goal Formulation: With patient Time For Goal Achievement: 04/11/24 Potential to Achieve Goals: Good  Plan      Co-evaluation                 AM-PAC OT 6 Clicks Daily Activity     Outcome Measure   Help from another person eating meals?: None Help from another person taking care of personal grooming?: A Little Help from another person toileting, which includes using toliet, bedpan, or urinal?: A Lot Help from another person bathing (including washing, rinsing, drying)?: A  Lot Help from another person to put on and taking off regular upper body clothing?: A Little Help from another person to put on and taking off regular lower body clothing?: A Lot 6 Click Score: 16    End of Session    OT Visit Diagnosis: Unsteadiness on feet (R26.81);Other abnormalities of gait and mobility (R26.89);Repeated falls (R29.6);Muscle weakness (generalized) (M62.81);History of falling (Z91.81);Pain Pain - Right/Left:  Left Pain - part of body: Hip   Activity Tolerance Patient tolerated treatment well   Patient Left in chair;with call bell/phone within reach   Nurse Communication Other (comment) (RN in room as pt provided pain of 5/10)        Time: 0932-1000 OT Time Calculation (min): 28 min  Charges: OT General Charges $OT Visit: 1 Visit OT Treatments $Self Care/Home Management : 23-37 mins  Delon, OT Acute Rehab Services Office: [phone] 03/30/2024   Delon Falter 03/30/2024, 11:18 AM

## 2024-03-30 NOTE — NC FL2 (Signed)
 [place]  MEDICAID FL2 LEVEL OF CARE FORM     IDENTIFICATION  Patient Name: Heather Gray Birthdate: 14-Nov-1950 Sex: female Admission Date (Current Location): 03/26/2024  [facility] and [place] Number:  Producer, television/film/video and Address:  [facility]  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) 03/26/2024   S/p revision of left total hip 03/26/2024   Primary osteoarthritis of right hip 06/29/2020   Bradycardia    Bipolar I disorder, most recent episode depressed (HCC) 03/04/2014   Anxiety state 03/04/2014    Orientation RESPIRATION BLADDER Height & Weight     Self, Time, Situation, Place  Normal Continent Weight: 171 lb 1.2 oz (77.6 kg) Height:  5' 4 (162.6 cm)  BEHAVIORAL SYMPTOMS/MOOD NEUROLOGICAL BOWEL NUTRITION STATUS      Continent Diet (Low Sodium Heart Healthy)  AMBULATORY STATUS COMMUNICATION OF NEEDS Skin   Limited Assist Verbally Surgical wounds (Wound 03/26/24 1526 Surgical Closed Surgical Incision Hip Left)                       Personal Care Assistance Level of Assistance  Bathing, Feeding, Dressing Bathing Assistance: Maximum assistance Feeding assistance: Independent Dressing Assistance: Limited assistance     Functional Limitations Info  Sight, Hearing, Speech Sight Info: Impaired (eye glasses) Hearing Info: Adequate Speech Info: Adequate    SPECIAL CARE FACTORS FREQUENCY  PT (By licensed PT), OT (By licensed OT)     PT Frequency: 5x per week OT Frequency: 5x  per week            Contractures Contractures Info: Not present    Additional Factors Info  Code Status, Allergies Code Status Info: FULL Allergies Info: Tetracyclines & Related, Sulfa Antibiotics           Current Medications (03/30/2024):  This is the current hospital active medication list Current Facility-Administered Medications  Medication Dose Route Frequency Provider Last Rate Last Admin   0.9 %  sodium chloride  infusion  10 mL/hr Intravenous Once Woodrum, Chelsey L, MD       0.9 %  sodium chloride  infusion   Intravenous Continuous Leigh Valery RAMAN, PA-C 75 mL/hr at 03/26/24 1815 New Bag at 03/26/24 1815   acetaminophen  (TYLENOL ) tablet 325-650 mg  325-650 mg Oral Q6H PRN Hill, Avery S, PA-C       ALPRAZolam  (XANAX ) tablet 0.75 mg  0.75 mg Oral QHS PRN Leigh Valery RAMAN, PA-C   0.75 mg at 03/29/24 2159   alum & mag hydroxide-simeth (MAALOX/MYLANTA) 200-200-20 MG/5ML suspension 30 mL  30 mL Oral Q4H PRN Leigh Valery RAMAN, PA-C       aspirin  chewable tablet 81 mg  81 mg Oral BID Leigh Valery RAMAN, PA-C   81 mg at 03/30/24 9061   bisacodyl  (DULCOLAX) suppository 10 mg  10 mg Rectal Daily PRN Hill, Avery S, PA-C       diphenhydrAMINE  (BENADRYL ) 12.5 MG/5ML elixir 12.5-25 mg  12.5-25 mg Oral Q4H PRN Hill, Avery S, PA-C  docusate sodium  (COLACE) capsule 100 mg  100 mg Oral BID Leigh Valery RAMAN, PA-C   100 mg at 03/30/24 9060   HYDROcodone -acetaminophen  (NORCO) 7.5-325 MG per tablet 1-2 tablet  1-2 tablet Oral Q4H PRN Hill, Avery S, PA-C       HYDROcodone -acetaminophen  (NORCO/VICODIN) 5-325 MG per tablet 1-2 tablet  1-2 tablet Oral Q4H PRN Leigh Valery RAMAN, PA-C   2 tablet at 03/30/24 0546   lamoTRIgine  (LAMICTAL ) tablet 200 mg  200 mg Oral q morning Hill, Avery S, PA-C   200 mg at 03/30/24 9060   menthol -cetylpyridinium (CEPACOL) lozenge 3 mg  1 lozenge Oral PRN Leigh Valery RAMAN, PA-C       Or   phenol (CHLORASEPTIC) mouth spray 1 spray  1 spray Mouth/Throat PRN Leigh Valery RAMAN, PA-C        methocarbamol  (ROBAXIN ) tablet 500 mg  500 mg Oral Q6H PRN Leigh Valery RAMAN, PA-C   500 mg at 03/29/24 1158   Or   methocarbamol  (ROBAXIN ) injection 500 mg  500 mg Intravenous Q6H PRN Hill, Avery S, PA-C       metoCLOPramide  (REGLAN ) tablet 5-10 mg  5-10 mg Oral Q8H PRN Hill, Avery S, PA-C       Or   metoCLOPramide  (REGLAN ) injection 5-10 mg  5-10 mg Intravenous Q8H PRN Hill, Avery S, PA-C       morphine  (PF) 2 MG/ML injection 0.5-1 mg  0.5-1 mg Intravenous Q2H PRN Hill, Avery S, PA-C       mupirocin  ointment (BACTROBAN ) 2 %   Nasal BID Leigh Valery RAMAN, PA-C   Given at 03/30/24 9060   ondansetron  (ZOFRAN ) tablet 4 mg  4 mg Oral Q6H PRN Leigh Valery RAMAN, PA-C   4 mg at 03/28/24 1327   Or   ondansetron  (ZOFRAN ) injection 4 mg  4 mg Intravenous Q6H PRN Leigh Valery RAMAN, PA-C   4 mg at 03/26/24 1807   pantoprazole  (PROTONIX ) EC tablet 40 mg  40 mg Oral Daily Leigh Valery RAMAN, PA-C   40 mg at 03/30/24 9061   polyethylene glycol (MIRALAX  / GLYCOLAX ) packet 17 g  17 g Oral Daily PRN Leigh Valery RAMAN, PA-C   17 g at 03/29/24 1202   QUEtiapine  (SEROQUEL ) tablet 300 mg  300 mg Oral QHS HillValery RAMAN, PA-C   300 mg at 03/29/24 2200   QUEtiapine  (SEROQUEL ) tablet 50 mg  50 mg Oral QHS Hill, Avery S, PA-C   50 mg at 03/29/24 2159   senna (SENOKOT) tablet 8.6 mg  1 tablet Oral BID Leigh Valery RAMAN, PA-C   8.6 mg at 03/30/24 9061   topiramate  (TOPAMAX ) tablet 200 mg  200 mg Oral QHS HillValery RAMAN, PA-C   200 mg at 03/29/24 2200     Discharge Medications: Please see discharge summary for a list of discharge medications.  Relevant Imaging Results:  Relevant Lab Results:   Additional Information SSN: [id number]  Heather LABOR Opal Mckellips, LCSW

## 2024-03-30 NOTE — Progress Notes (Signed)
 Physical Therapy Treatment Patient Details Name: Heather Gray MRN: [id number] DOB: 04-Apr-1951 Today's Date: 03/30/2024   History of Present Illness Pt s/p L THR acetabular revision 2* recalled component.  Pt with hx of CKD, Bil THR, and bipolar    PT Comments  Improving activity tolerance, pt ambulated 9' with RW/hip ABD brace and good adherence to TDWB status. Reviewed HEP. 3/10 pain L hip with activity.     If plan is discharge home, recommend the following: A little help with walking and/or transfers;A little help with bathing/dressing/bathroom;Assistance with cooking/housework;Assist for transportation;Help with stairs or ramp for entrance   Can travel by private vehicle        Equipment Recommendations  None recommended by PT    Recommendations for Other Services       Precautions / Restrictions Precautions Precautions: Fall;Posterior Hip Precaution Booklet Issued: Yes (comment) Recall of Precautions/Restrictions: Intact Precaution/Restrictions Comments: reviewed posterior precautions Required Braces or Orthoses: Other Brace Other Brace: Abductor brace (max assist to don/doff) Restrictions Weight Bearing Restrictions Per Provider Order: Yes LLE Weight Bearing Per Provider Order: Touchdown weight bearing     Mobility  Bed Mobility Overal bed mobility: Needs Assistance Bed Mobility: Sit to Supine     Supine to sit: Min assist, HOB elevated, Used rails Sit to supine: Min assist   General bed mobility comments: min A to support LLE, gait belt used as leg lifter    Transfers Overall transfer level: Needs assistance Equipment used: Rolling walker (2 wheels) Transfers: Sit to/from Stand Sit to Stand: Supervision           General transfer comment: VCs for adherence to THP and use of UEs to self assist    Ambulation/Gait Ambulation/Gait assistance: Contact guard assist Gait Distance (Feet): 44 Feet Assistive device: Rolling walker (2 wheels) Gait  Pattern/deviations: Step-to pattern, Decreased step length - right, Decreased step length - left, Trunk flexed Gait velocity: decr     General Gait Details: good adherence to TDWB on LLE, distance limited by pain/fatigue   Stairs             Wheelchair Mobility     Tilt Bed    Modified Rankin (Stroke Patients Only)       Balance Overall balance assessment: Needs assistance Sitting-balance support: Feet supported, No upper extremity supported Sitting balance-Leahy Scale: Good     Standing balance support: Bilateral upper extremity supported Standing balance-Leahy Scale: Poor Standing balance comment: reliant on RW                            Communication Communication Communication: No apparent difficulties  Cognition Arousal: Alert Behavior During Therapy: WFL for tasks assessed/performed   PT - Cognitive impairments: No apparent impairments                         Following commands: Intact      Cueing Cueing Techniques: Verbal cues  Exercises Total Joint Exercises Ankle Circles/Pumps: AROM, Both, 15 reps, Supine Quad Sets: AROM, Both, 10 reps, Supine Gluteal Sets: AROM, Both, Supine, 10 reps Short Arc Quad: AROM, Left, 10 reps, Supine Heel Slides: AAROM, Left, Supine, 10 reps    General Comments        Pertinent Vitals/Pain Pain Assessment Pain Score: 3  Pain Location: L hip with walking Pain Descriptors / Indicators: Sore Pain Intervention(s): Limited activity within patient's tolerance, Monitored during session, Premedicated before session  Home Living                          Prior Function            PT Goals (current goals can now be found in the care plan section) Acute Rehab PT Goals Patient Stated Goal: Regain IND PT Goal Formulation: With patient Time For Goal Achievement: 04/03/24 Potential to Achieve Goals: Good Progress towards PT goals: Progressing toward goals    Frequency     7X/week      PT Plan      Co-evaluation              AM-PAC PT 6 Clicks Mobility   Outcome Measure  Help needed turning from your back to your side while in a flat bed without using bedrails?: A Little Help needed moving from lying on your back to sitting on the side of a flat bed without using bedrails?: A Little Help needed moving to and from a bed to a chair (including a wheelchair)?: A Little Help needed standing up from a chair using your arms (e.g., wheelchair or bedside chair)?: A Little Help needed to walk in hospital room?: A Little Help needed climbing 3-5 steps with a railing? : A Lot 6 Click Score: 17    End of Session Equipment Utilized During Treatment: Gait belt Activity Tolerance: Patient limited by fatigue Patient left: with call bell/phone within reach;in bed;with bed alarm set Nurse Communication: Mobility status PT Visit Diagnosis: Difficulty in walking, not elsewhere classified (R26.2);Pain Pain - Right/Left: Left Pain - part of body: Hip     Time: 8677-8655 PT Time Calculation (min) (ACUTE ONLY): 22 min  Charges:    $Gait Training: 8-22 mins PT General Charges $$ ACUTE PT VISIT: 1 Visit                     Sylvan Delon Copp PT 03/30/2024  Acute Rehabilitation Services  Office [phone]

## 2024-03-30 NOTE — Progress Notes (Signed)
    Subjective:  Patient reports pain as mild to moderate.  Denies N/V/CP/SOB. IV infiltrated during first unit PRBCs yesterday. Reports her home helpers are no longer available.  Objective:   VITALS:   Vitals:   03/29/24 1339 03/29/24 2225 03/29/24 2226 03/30/24 0700  BP: 136/64 134/74 134/74 (!) 104/55  Pulse: 64 (!) 59 (!) 59 64  Resp: 16 16 16 16   Temp: 98 F (36.7 C) 98.3 F (36.8 C) 98.3 F (36.8 C) 98.3 F (36.8 C)  TempSrc: Oral Oral Oral   SpO2: 99% 99% 99% 99%  Weight:      Height:        NAD ABD soft Sensation intact distally Intact pulses distally Dorsiflexion/Plantar flexion intact Incision: dressing C/D/I Compartment soft Mild swelling left thigh with ecchymosis  Lab Results  Component Value Date   WBC 7.8 03/28/2024   HGB 7.7 (L) 03/29/2024   HCT 24.9 (L) 03/29/2024   MCV 93.6 03/28/2024   PLT 147 (L) 03/28/2024   BMET    Component Value Date/Time   NA 135 03/28/2024 0420   K 3.7 03/28/2024 0420   CL 107 03/28/2024 0420   CO2 22 03/28/2024 0420   GLUCOSE 102 (H) 03/28/2024 0420   BUN 31 (H) 03/28/2024 0420   CREATININE 1.19 (H) 03/28/2024 0420   CALCIUM 8.1 (L) 03/28/2024 0420   GFRNONAA 49 (L) 03/28/2024 0420     Assessment/Plan: 4 Days Post-Op   Principal Problem:   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, [place], [place] [address] Phone: [phone] [email or web address] Facebook  [employer]

## 2024-03-30 NOTE — TOC Progression Note (Signed)
 Transition of Care [facility]) - Progression Note    Patient Details  Name: Heather Gray MRN: [id number] Date of Birth: 04-Nov-1950  Transition of [facility]) CM/SW Contact  Heather DELENA Saltness, LCSW Phone Number: 03/30/2024, 9:42 AM  Clinical Narrative:    Pt recommended for short-term SNF rehab by PT. Pt agreeable to SNF. CSW completed FL2 and sent referrals to SNF locations in [place] and surrounding areas. TOC will continue to follow.     Barriers to Discharge: No Barriers Identified    Expected Discharge Plan and Services  SNF                       DME Arranged: N/A DME Agency: NA        Social Drivers of Health (SDOH) Interventions SDOH Screenings   Food Insecurity: No Food Insecurity (03/26/2024)  Housing: Low Risk  (03/26/2024)  Transportation Needs: No Transportation Needs (03/26/2024)  Utilities: Not At Risk (03/26/2024)  Financial Resource Strain: Low Risk  (05/07/2022)   Received from [facility] visits prior to 10/27/2022., [facility]  Physical Activity: Sufficiently Active (05/07/2022)   Received from [facility], [facility] visits prior to 10/27/2022.  Social Connections: Unknown (03/26/2024)  Stress: Stress Concern Present (05/07/2022)   Received from [facility] visits prior to 10/27/2022., [facility]  Tobacco Use: Medium Risk (03/26/2024)    Readmission Risk Interventions    03/27/2024   11:03 AM  Readmission Risk Prevention Plan  Post Dischage Appt Complete  Medication Screening Complete  Transportation Screening Complete     Signed: Heather Saltness, MSW, LCSW Clinical Social Worker Inpatient Care Management 03/30/2024 9:44 AM

## 2024-03-30 NOTE — Progress Notes (Signed)
 Physical Therapy Treatment Patient Details Name: Heather Gray MRN: [id number] DOB: 1951/02/21 Today's Date: 03/30/2024   History of Present Illness Pt s/p L THR acetabular revision 2* recalled component.  Pt with hx of CKD, Bil THR, and bipolar    PT Comments  Pt is progressing well with mobility, she tolerated increased ambulation distance of 19' with RW with good adherence to TDWB status. Max assist to don hip abduction brace. Reviewed posterior hip precautions. Reviewed THA HEP. Pt reports she does not have help available at home (the people she'd lined up went on vacation) and therefore needs to go to rehab.     If plan is discharge home, recommend the following: A little help with walking and/or transfers;A little help with bathing/dressing/bathroom;Assistance with cooking/housework;Assist for transportation;Help with stairs or ramp for entrance   Can travel by private vehicle        Equipment Recommendations  None recommended by PT    Recommendations for Other Services       Precautions / Restrictions Precautions Precautions: Fall;Posterior Hip Precaution Booklet Issued: Yes (comment) Recall of Precautions/Restrictions: Intact Precaution/Restrictions Comments: reviewed posterior precautions Required Braces or Orthoses: Other Brace Other Brace: Abductor brace Restrictions Weight Bearing Restrictions Per Provider Order: Yes LLE Weight Bearing Per Provider Order: Touchdown weight bearing     Mobility  Bed Mobility Overal bed mobility: Needs Assistance Bed Mobility: Supine to Sit     Supine to sit: Min assist, HOB elevated, Used rails     General bed mobility comments: min A to support LLE, gait belt used as leg lifter; max assist to don abductor brace    Transfers Overall transfer level: Needs assistance Equipment used: Rolling walker (2 wheels) Transfers: Sit to/from Stand Sit to Stand: Contact guard assist           General transfer comment: VCs for adherence  to THP and use of UEs to self assist    Ambulation/Gait Ambulation/Gait assistance: Contact guard assist Gait Distance (Feet): 24 Feet Assistive device: Rolling walker (2 wheels) Gait Pattern/deviations: Step-to pattern, Decreased step length - right, Decreased step length - left, Trunk flexed Gait velocity: decr     General Gait Details: good adherence to TDWB on LLE, distance limited by pain/fatigue   Stairs             Wheelchair Mobility     Tilt Bed    Modified Rankin (Stroke Patients Only)       Balance Overall balance assessment: Needs assistance Sitting-balance support: Feet supported, No upper extremity supported Sitting balance-Leahy Scale: Good     Standing balance support: Bilateral upper extremity supported Standing balance-Leahy Scale: Poor Standing balance comment: reliant on RW                            Communication Communication Communication: No apparent difficulties  Cognition Arousal: Alert Behavior During Therapy: WFL for tasks assessed/performed   PT - Cognitive impairments: No apparent impairments                         Following commands: Intact      Cueing Cueing Techniques: Verbal cues  Exercises Total Joint Exercises Ankle Circles/Pumps: AROM, Both, 15 reps, Supine Quad Sets: AROM, Both, 10 reps, Supine Gluteal Sets: AROM, Both, Supine, 10 reps Heel Slides: AAROM, Left, Supine, 10 reps    General Comments        Pertinent Vitals/Pain Pain Assessment  Pain Score: 6  Pain Location: L hip walking Pain Descriptors / Indicators: Aching, Sore Pain Intervention(s): Limited activity within patient's tolerance, Monitored during session, Premedicated before session, Repositioned, Ice applied    Home Living                          Prior Function            PT Goals (current goals can now be found in the care plan section) Acute Rehab PT Goals Patient Stated Goal: Regain IND PT Goal  Formulation: With patient Time For Goal Achievement: 04/03/24 Potential to Achieve Goals: Good Progress towards PT goals: Progressing toward goals    Frequency    7X/week      PT Plan      Co-evaluation              AM-PAC PT 6 Clicks Mobility   Outcome Measure  Help needed turning from your back to your side while in a flat bed without using bedrails?: A Little Help needed moving from lying on your back to sitting on the side of a flat bed without using bedrails?: A Little Help needed moving to and from a bed to a chair (including a wheelchair)?: A Little Help needed standing up from a chair using your arms (e.g., wheelchair or bedside chair)?: A Little Help needed to walk in hospital room?: A Little Help needed climbing 3-5 steps with a railing? : A Lot 6 Click Score: 17    End of Session Equipment Utilized During Treatment: Gait belt Activity Tolerance: Patient limited by fatigue Patient left: with call bell/phone within reach;in chair;with family/visitor present;with nursing/sitter in room Nurse Communication: Mobility status PT Visit Diagnosis: Difficulty in walking, not elsewhere classified (R26.2);Pain Pain - Right/Left: Left Pain - part of body: Hip     Time: 9099-9061 PT Time Calculation (min) (ACUTE ONLY): 38 min  Charges:    $Gait Training: 8-22 mins $Therapeutic Exercise: 8-22 mins $Therapeutic Activity: 8-22 mins PT General Charges $$ ACUTE PT VISIT: 1 Visit                     Sylvan Delon Copp PT 03/30/2024  Acute Rehabilitation Services  Office [phone]

## 2024-03-31 LAB — AEROBIC/ANAEROBIC CULTURE W GRAM STAIN (SURGICAL/DEEP WOUND)
Culture: NO GROWTH
Gram Stain: NONE SEEN

## 2024-03-31 LAB — HEMOGLOBIN AND HEMATOCRIT, BLOOD
HCT: 22.1 % — ABNORMAL LOW (ref 36.0–46.0)
Hemoglobin: 7.3 g/dL — ABNORMAL LOW (ref 12.0–15.0)

## 2024-03-31 NOTE — Progress Notes (Signed)
 Report called to Debbie at [facility]. Patient being discharged via ambulance. Donzell Barge, RN

## 2024-03-31 NOTE — TOC Progression Note (Addendum)
 Transition of Care [facility]) - Progression Note    Patient Details  Name: Heather Gray MRN: [id number] Date of Birth: [birth year]  [facility]) CM/SW Contact  NORMAN ASPEN, LCSW Phone Number: 03/31/2024, 1:09 PM  Clinical Narrative:   ADDENDUM: Have received insurance authorization and facility can admit pt today.  MD alerted and pt medically cleared for dc today.  Pt aware and agreeable.  PTAR called at 2:55pm.  RN to call report to [phone].  No further TOC needs.  Have reviewed SNF bed offers with pt and she has accepted bed at [facility].  Confirmed with facility who can admit patient tomorrow.  Insurance authorization begun.    Barriers to Discharge: No Barriers Identified               Expected Discharge Plan and Services                         DME Arranged: N/A DME Agency: NA                   Social Drivers of Health (SDOH) Interventions SDOH Screenings   Food Insecurity: No Food Insecurity (03/26/2024)  Housing: Low Risk  (03/26/2024)  Transportation Needs: No Transportation Needs (03/26/2024)  Utilities: Not At Risk (03/26/2024)  Financial Resource Strain: Low Risk  (05/07/2022)   Received from [facility] visits prior to 10/27/2022., [facility]  Physical Activity: Sufficiently Active (05/07/2022)   [facility] from [facility], [facility] visits prior to 10/27/2022.  Social Connections: Unknown (03/26/2024)  Stress: Stress Concern Present (05/07/2022)   Received from [facility] visits prior to 10/27/2022., [facility]  Tobacco Use: Medium Risk (03/26/2024)    Readmission Risk Interventions    03/27/2024   11:03 AM  Readmission Risk Prevention Plan  Post Dischage Appt Complete  Medication Screening Complete  Transportation Screening Complete

## 2024-03-31 NOTE — Progress Notes (Signed)
 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  Pt with hx of CKD, Bil THR, and bipolar    PT Comments  Pt reports increased pain today, she thinks she may have overdone it yesterday with doing quad sets on her own. Decreased activity tolerance this session, she ambulated 4' with RW and abduction brace, distance limited by pain. Pt had pain medication 1 hour prior to PT session. Will attempt again this afternoon.    If plan is discharge home, recommend the following: A little help with walking and/or transfers;A little help with bathing/dressing/bathroom;Assistance with cooking/housework;Assist for transportation;Help with stairs or ramp for entrance   Can travel by private vehicle        Equipment Recommendations  None recommended by PT    Recommendations for Other Services       Precautions / Restrictions Precautions Precautions: Fall;Posterior Hip Precaution Booklet Issued: Yes (comment) Recall of Precautions/Restrictions: Intact Precaution/Restrictions Comments: reviewed posterior precautions Required Braces or Orthoses: Other Brace Other Brace: Abductor brace (max assist to don/doff) Restrictions Weight Bearing Restrictions Per Provider Order: Yes LLE Weight Bearing Per Provider Order: Touchdown weight bearing     Mobility  Bed Mobility                    Transfers Overall transfer level: Needs assistance Equipment used: Rolling walker (2 wheels) Transfers: Sit to/from Stand Sit to Stand: Supervision           General transfer comment: VCs for adherence to THP and use of UEs to self assist    Ambulation/Gait Ambulation/Gait assistance: Contact guard assist Gait Distance (Feet): 4 Feet Assistive device: Rolling walker (2 wheels) Gait Pattern/deviations: Step-to pattern, Decreased step length -  right, Decreased step length - left, Trunk flexed Gait velocity: decr     General Gait Details: good adherence to TDWB on LLE, distance limited by pain/fatigue   Stairs             Wheelchair Mobility     Tilt Bed    Modified Rankin (Stroke Patients Only)       Balance Overall balance assessment: Needs assistance Sitting-balance support: Feet supported, No upper extremity supported Sitting balance-Leahy Scale: Good     Standing balance support: Bilateral upper extremity supported Standing balance-Leahy Scale: Poor Standing balance comment: reliant on RW                            Communication Communication Communication: No apparent difficulties  Cognition Arousal: Alert Behavior During Therapy: WFL for tasks assessed/performed   PT - Cognitive impairments: No apparent impairments                         Following commands: Intact      Cueing Cueing Techniques: Verbal cues  Exercises Total Joint Exercises Ankle Circles/Pumps: AROM, Both, 15 reps, Supine Heel Slides: AAROM, Left, Supine, 15 reps    General Comments        Pertinent Vitals/Pain Pain Assessment Pain Score: 9  Pain Location: L hip with walking Pain Descriptors / Indicators: Sore Pain Intervention(s): Limited activity within patient's tolerance, Monitored during session, Premedicated before session, Repositioned, Ice applied    Home Living  Prior Function            PT Goals (current goals can now be found in the care plan section) Acute Rehab PT Goals Patient Stated Goal: Regain IND PT Goal Formulation: With patient Time For Goal Achievement: 04/03/24 Potential to Achieve Goals: Good Progress towards PT goals: Progressing toward goals    Frequency    7X/week      PT Plan      Co-evaluation              AM-PAC PT 6 Clicks Mobility   Outcome Measure  Help needed turning from your back to your side  while in a flat bed without using bedrails?: A Little Help needed moving from lying on your back to sitting on the side of a flat bed without using bedrails?: A Little Help needed moving to and from a bed to a chair (including a wheelchair)?: A Little Help needed standing up from a chair using your arms (e.g., wheelchair or bedside chair)?: A Little Help needed to walk in hospital room?: A Little Help needed climbing 3-5 steps with a railing? : A Lot 6 Click Score: 17    End of Session Equipment Utilized During Treatment: Gait belt Activity Tolerance: Patient limited by fatigue;Patient limited by pain Patient left: with call bell/phone within reach;in bed;with bed alarm set Nurse Communication: Mobility status PT Visit Diagnosis: Difficulty in walking, not elsewhere classified (R26.2);Pain Pain - Right/Left: Left Pain - part of body: Hip     Time: 8996-8976 PT Time Calculation (min) (ACUTE ONLY): 20 min  Charges:    $Gait Training: 8-22 mins PT General Charges $$ ACUTE PT VISIT: 1 Visit                     Sylvan Delon Copp PT 03/31/2024  Acute Rehabilitation Services  Office [phone]

## 2024-03-31 NOTE — Progress Notes (Signed)
 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  Pt with hx of CKD, Bil THR, and bipolar    PT Comments  Improved activity tolerance with ambulation, pt ambulated 51' with RW and L hip abduction brace. Pt tolerated L hip exercises well. Noted plan to DC to SNF this afternoon.   If plan is discharge home, recommend the following: A little help with walking and/or transfers;A little help with bathing/dressing/bathroom;Assistance with cooking/housework;Assist for transportation;Help with stairs or ramp for entrance   Can travel by private vehicle        Equipment Recommendations  None recommended by PT    Recommendations for Other Services       Precautions / Restrictions Precautions Precautions: Fall;Posterior Hip Precaution Booklet Issued: Yes (comment) Recall of Precautions/Restrictions: Intact Precaution/Restrictions Comments: reviewed posterior precautions Required Braces or Orthoses: Other Brace Other Brace: Abductor brace (max assist to don/doff) Restrictions Weight Bearing Restrictions Per Provider Order: Yes LLE Weight Bearing Per Provider Order: Touchdown weight bearing     Mobility  Bed Mobility                    Transfers Overall transfer level: Modified independent Equipment used: Rolling walker (2 wheels) Transfers: Sit to/from Stand Sit to Stand: Modified independent (Device/Increase time)           General transfer comment: good adherence to THP and safety    Ambulation/Gait Ambulation/Gait assistance: Contact guard assist Gait Distance (Feet): 49 Feet Assistive device: Rolling walker (2 wheels) Gait Pattern/deviations: Step-to pattern, Decreased step length - right, Decreased step length - left, Trunk flexed Gait velocity: decr     General Gait Details: good adherence to TDWB on  LLE   Stairs             Wheelchair Mobility     Tilt Bed    Modified Rankin (Stroke Patients Only)       Balance Overall balance assessment: Needs assistance Sitting-balance support: Feet supported, No upper extremity supported Sitting balance-Leahy Scale: Good     Standing balance support: Bilateral upper extremity supported Standing balance-Leahy Scale: Poor Standing balance comment: reliant on RW                            Communication Communication Communication: No apparent difficulties  Cognition Arousal: Alert Behavior During Therapy: WFL for tasks assessed/performed   PT - Cognitive impairments: No apparent impairments                         Following commands: Intact      Cueing Cueing Techniques: Verbal cues  Exercises Total Joint Exercises Ankle Circles/Pumps: AROM, Both, 15 reps, Supine Quad Sets: AROM, Both, 10 reps, Supine Gluteal Sets: AROM, Both, Supine, 10 reps Short Arc Quad: AROM, Left, Supine, 15 reps Heel Slides: AAROM, Left, Supine, 15 reps    General Comments        Pertinent Vitals/Pain Pain Assessment Pain Score: 5  Pain Location: L hip with walking Pain Descriptors / Indicators: Sore Pain Intervention(s): Limited activity within patient's tolerance, Monitored during session, Repositioned, Premedicated before session, Ice applied    Home Living                          Prior  Function            PT Goals (current goals can now be found in the care plan section) Acute Rehab PT Goals Patient Stated Goal: Regain IND PT Goal Formulation: With patient Time For Goal Achievement: 04/03/24 Potential to Achieve Goals: Good Progress towards PT goals: Progressing toward goals    Frequency    7X/week      PT Plan      Co-evaluation              AM-PAC PT 6 Clicks Mobility   Outcome Measure  Help needed turning from your back to your side while in a flat bed without using  bedrails?: A Little Help needed moving from lying on your back to sitting on the side of a flat bed without using bedrails?: A Little Help needed moving to and from a bed to a chair (including a wheelchair)?: A Little Help needed standing up from a chair using your arms (e.g., wheelchair or bedside chair)?: A Little Help needed to walk in hospital room?: A Little Help needed climbing 3-5 steps with a railing? : A Lot 6 Click Score: 17    End of Session Equipment Utilized During Treatment: Gait belt Activity Tolerance: Patient limited by fatigue;Patient limited by pain Patient left: with call bell/phone within reach;in bed;with bed alarm set Nurse Communication: Mobility status PT Visit Diagnosis: Difficulty in walking, not elsewhere classified (R26.2);Pain Pain - Right/Left: Left Pain - part of body: Hip     Time: 8588-8571 PT Time Calculation (min) (ACUTE ONLY): 17 min  Charges:    $Gait Training: 8-22 mins PT General Charges $$ ACUTE PT VISIT: 1 Visit                     Sylvan Delon Copp PT 03/31/2024  Acute Rehabilitation Services  Office [phone]

## 2024-04-28 ENCOUNTER — Ambulatory Visit: Payer: Self-pay | Admitting: Student

## 2024-04-28 ENCOUNTER — Encounter (HOSPITAL_COMMUNITY): Payer: Self-pay | Admitting: Orthopedic Surgery

## 2024-04-28 ENCOUNTER — Other Ambulatory Visit: Payer: Self-pay

## 2024-04-28 MED ORDER — VANCOMYCIN HCL 10 G IV SOLR
1000.0000 mg | Freq: Once | INTRAVENOUS | Status: AC
  Administered 2024-04-30: 1000 mg via INTRAVENOUS

## 2024-04-28 NOTE — Patient Instructions (Signed)
 SURGICAL WAITING ROOM VISITATION  Patients having surgery or a procedure may have no more than 2 support people in the waiting area - these visitors may rotate.    Children under the age of 54 must have an adult with them who is not the patient.  Visitors with respiratory illnesses are discouraged from visiting and should remain at home.  If the patient needs to stay at the hospital during part of their recovery, the visitor guidelines for inpatient rooms apply. Pre-op nurse will coordinate an appropriate time for 1 support person to accompany patient in pre-op.  This support person may not rotate.    Please refer to the [facility] website for the visitor guidelines for Inpatients (after your surgery is over and you are in a regular room).       Your procedure is scheduled on: Thursday, Sep. 4, 2025   Report to [facility] Main Entrance    Report to admitting at 8:45 AM   Call this number if you have problems the morning of surgery [phone]   Do not eat food :After Midnight.   After Midnight you may have the following liquids until ______ AM/ PM DAY OF SURGERY  Water  Non-Citrus Juices (without pulp, NO RED-Apple, White grape, White cranberry) Black Coffee (NO MILK/CREAM OR CREAMERS, sugar ok)  Clear Tea (NO MILK/CREAM OR CREAMERS, sugar ok) regular and decaf                             Plain Jell-O (NO RED)                                           Fruit ices (not with fruit pulp, NO RED)                                     Popsicles (NO RED)                                                               Sports drinks like Gatorade (NO RED)                  The day of surgery:  Drink ONE (1) Pre-Surgery Clear Ensure or G2 at AM the morning of surgery. Drink in one sitting. Do not sip.  This drink was given to you during your hospital  pre-op appointment visit. Nothing else to drink after completing the  Pre-Surgery Clear Ensure or G2.          If you have  questions, please contact your surgeon's office.   FOLLOW BOWEL PREP AND ANY ADDITIONAL PRE OP INSTRUCTIONS YOU RECEIVED FROM YOUR SURGEON'S OFFICE!!!     Oral Hygiene is also important to reduce your risk of infection.                                    Remember - BRUSH YOUR TEETH THE MORNING OF SURGERY WITH YOUR REGULAR TOOTHPASTE  DENTURES WILL BE  REMOVED PRIOR TO SURGERY PLEASE DO NOT APPLY Poly grip OR ADHESIVES!!!   Do NOT smoke after Midnight   Stop all vitamins and herbal supplements 7 days before surgery.   Take these medicines the morning of surgery with A SIP OF WATER :  Amlodipine            Lamictal              You may not have any metal on your body including hair pins, jewelry, and body piercing             Do not wear make-up, lotions, powders, perfumes/cologne, or deodorant  Do not wear nail polish including gel and S&S, artificial/acrylic nails, or any other type of covering on natural nails including finger and toenails. If you have artificial nails, gel coating, etc. that needs to be removed by a nail salon please have this removed prior to surgery or surgery may need to be canceled/ delayed if the surgeon/ anesthesia feels like they are unable to be safely monitored.   Do not shave  48 hours prior to surgery.    Do not bring valuables to the hospital. [facility] IS NOT             RESPONSIBLE   FOR VALUABLES.   Contacts, glasses, dentures or bridgework may not be worn into surgery.   Bring small overnight bag day of surgery.   DO NOT BRING YOUR HOME MEDICATIONS TO THE HOSPITAL. PHARMACY WILL DISPENSE MEDICATIONS LISTED ON YOUR MEDICATION LIST TO YOU DURING YOUR ADMISSION IN THE HOSPITAL!    Patients discharged on the day of surgery will not be allowed to drive home.  Someone NEEDS to stay with you for the first 24 hours after anesthesia.   Special Instructions: Bring a copy of your healthcare power of attorney and living will documents the day of surgery  if you haven't scanned them before.              Please read over the following fact sheets you were given: IF YOU HAVE QUESTIONS ABOUT YOUR PRE-OP INSTRUCTIONS PLEASE CALL [phone].   If you received a COVID test during your pre-op visit  it is requested that you wear a mask when out in public, stay away from anyone that may not be feeling well and notify your surgeon if you develop symptoms. If you test positive for Covid or have been in contact with anyone that has tested positive in the last 10 days please notify you surgeon.    [facility] - Preparing for Surgery Before surgery, you can play an important role.  Because skin is not sterile, your skin needs to be as free of germs as possible.  You can reduce the number of germs on your skin by washing with CHG (chlorahexidine gluconate) soap before surgery.  CHG is an antiseptic cleaner which kills germs and bonds with the skin to continue killing germs even after washing. Please DO NOT use if you have an allergy to CHG or antibacterial soaps.  If your skin becomes reddened/irritated stop using the CHG and inform your nurse when you arrive at Short Stay. Do not shave (including legs and underarms) for at least 48 hours prior to the first CHG shower.  You may shave your face/neck.  Please follow these instructions carefully:  1.  Shower with CHG Soap the night before surgery and the  morning of surgery.  2.  If you choose to wash your hair, wash your  hair first as usual with your normal  shampoo.  3.  After you shampoo, rinse your hair and body thoroughly to remove the shampoo.                             4.  Use CHG as you would any other liquid soap.  You can apply chg directly to the skin and wash.  Gently with a scrungie or clean washcloth.  5.  Apply the CHG Soap to your body ONLY FROM THE NECK DOWN.   Do   not use on face/ open                           Wound or open sores. Avoid contact with eyes, ears mouth and   genitals (private parts).                        Wash face,  Genitals (private parts) with your normal soap.             6.  Wash thoroughly, paying special attention to the area where your    surgery  will be performed.  7.  Thoroughly rinse your body with warm water  from the neck down.  8.  DO NOT shower/wash with your normal soap after using and rinsing off the CHG Soap.                9.  Pat yourself dry with a clean towel.            10.  Wear clean pajamas.            11.  Place clean sheets on your bed the night of your first shower and do not  sleep with pets. Day of Surgery : Do not apply any lotions/deodorants the morning of surgery.  Please wear clean clothes to the hospital/surgery center.  FAILURE TO FOLLOW THESE INSTRUCTIONS MAY RESULT IN THE CANCELLATION OF YOUR SURGERY  PATIENT SIGNATURE_________________________________  NURSE SIGNATURE__________________________________  ________________________________________________________________________

## 2024-04-28 NOTE — Progress Notes (Signed)
 For Anesthesia: PCP -  Bernardino Level, FNP  [facility]. Clearance scanned to Media.  Cardiologist - N/A  Bowel Prep reminder:N/A  Chest x-ray - greater than 1 year in [facility] EKG - 03/16/24 in [facility] Stress Test - 03/07/20 in [facility] ECHO - 02/18/20 in [facility] Cardiac Cath - N/A Pacemaker/ICD device last checked:N/A Pacemaker orders received:N/A Device Rep notified:N/A  Spinal Cord Stimulator:N/A  Sleep Study -  Yes CPAP - No  Fasting Blood Sugar - N/A Checks Blood Sugar _____ times a day Date and result of last Hgb A1c-  Blood Thinner Instructions: N/A Aspirin  Instructions: 81 mg Last Dose: 04/21/24  Activity level: Able to exercise without chest pain and/or shortness of breath       Anesthesia review:  Patient denies shortness of breath, fever, cough and chest pain at PAT appointment   Patient verbalized understanding of instructions that were reviewed over the telephone.

## 2024-04-29 ENCOUNTER — Encounter (HOSPITAL_COMMUNITY)
Admission: RE | Admit: 2024-04-29 | Discharge: 2024-04-29 | Disposition: A | Discharge status: Home / Self Care | Source: Ambulatory Visit | Attending: Orthopedic Surgery | Admitting: Orthopedic Surgery

## 2024-04-29 DIAGNOSIS — Z01812 Encounter for preprocedural laboratory examination: Secondary | ICD-10-CM | POA: Insufficient documentation

## 2024-04-29 DIAGNOSIS — Z01818 Encounter for other preprocedural examination: Secondary | ICD-10-CM

## 2024-04-29 DIAGNOSIS — I10 Essential (primary) hypertension: Secondary | ICD-10-CM | POA: Insufficient documentation

## 2024-04-29 LAB — BASIC METABOLIC PANEL WITH GFR
Anion gap: 11 (ref 5–15)
BUN: 25 mg/dL — ABNORMAL HIGH (ref 8–23)
CO2: 23 mmol/L (ref 22–32)
Calcium: 9.3 mg/dL (ref 8.9–10.3)
Chloride: 105 mmol/L (ref 98–111)
Creatinine, Ser: 0.87 mg/dL (ref 0.44–1.00)
GFR, Estimated: >60 mL/min (ref >60)
Glucose, Bld: 95 mg/dL (ref 70–99)
Potassium: 4.1 mmol/L (ref 3.5–5.1)
Sodium: 140 mmol/L (ref 135–145)

## 2024-04-29 LAB — SURGICAL PCR SCREEN
MRSA, PCR: NEGATIVE
Staphylococcus aureus: NEGATIVE

## 2024-04-29 LAB — CBC
HCT: 32 % — ABNORMAL LOW (ref 36.0–46.0)
Hemoglobin: 10.2 g/dL — ABNORMAL LOW (ref 12.0–15.0)
MCH: 31.2 pg (ref 26.0–34.0)
MCHC: 31.9 g/dL (ref 30.0–36.0)
MCV: 97.9 fL (ref 80.0–100.0)
Platelets: 263 K/uL (ref 150–400)
RBC: 3.27 MIL/uL — ABNORMAL LOW (ref 3.87–5.11)
RDW: 13.2 % (ref 11.5–15.5)
WBC: 5 K/uL (ref 4.0–10.5)
nRBC: 0 % (ref 0.0–0.2)

## 2024-04-29 NOTE — Patient Instructions (Addendum)
 SURGICAL WAITING ROOM VISITATION  Patients having surgery or a procedure may have no more than 2 support people in the waiting area - these visitors may rotate.    Children under the age of 14 must have an adult with them who is not the patient.  Visitors with respiratory illnesses are discouraged from visiting and should remain at home.  If the patient needs to stay at the hospital during part of their recovery, the visitor guidelines for inpatient rooms apply. Pre-op nurse will coordinate an appropriate time for 1 support person to accompany patient in pre-op.  This support person may not rotate.    Please refer to the [facility] website for the visitor guidelines for Inpatients (after your surgery is over and you are in a regular room).       Your procedure is scheduled on: Tomorrow, Sept. 4, 2025   Report to [facility] Main Entrance    Report to admitting at 8:45 AM   Call this number if you have problems the morning of surgery [phone]   Do not eat food :After Midnight.   After Midnight you may have the following liquids until 8:00 AM  DAY OF SURGERY  Water  Non-Citrus Juices (without pulp, NO RED-Apple, White grape, White cranberry) Black Coffee (NO MILK/CREAM OR CREAMERS, sugar ok)  Clear Tea (NO MILK/CREAM OR CREAMERS, sugar ok) regular and decaf                             Plain Jell-O (NO RED)                                           Fruit ices (not with fruit pulp, NO RED)                                     Popsicles (NO RED)                                                               Sports drinks like Gatorade (NO RED)                  The day of surgery:  Drink ONE (1) Pre-Surgery Clear Ensure 8:00 AM the morning of surgery. Drink in one sitting. Do not sip.  This drink was given to you during your hospital  pre-op appointment visit. Nothing else to drink after completing the  Pre-Surgery Clear Ensure.          If you have questions, please  contact your surgeon's office.   FOLLOW BOWEL PREP AND ANY ADDITIONAL PRE OP INSTRUCTIONS YOU RECEIVED FROM YOUR SURGEON'S OFFICE!!!     Oral Hygiene is also important to reduce your risk of infection.                                    Remember - BRUSH YOUR TEETH THE MORNING OF SURGERY WITH YOUR REGULAR TOOTHPASTE  DENTURES WILL BE REMOVED PRIOR TO SURGERY  PLEASE DO NOT APPLY Poly grip OR ADHESIVES!!!   Do NOT smoke after Midnight   Stop all vitamins and herbal supplements 7 days before surgery.   Take these medicines the morning of surgery with A SIP OF WATER : Amlodipine Lamictal                                You may not have any metal on your body including hair pins, jewelry, and body piercing             Do not wear make-up, lotions, powders, perfumes/cologne, or deodorant  Do not wear nail polish including gel and S&S, artificial/acrylic nails, or any other type of covering on natural nails including finger and toenails. If you have artificial nails, gel coating, etc. that needs to be removed by a nail salon please have this removed prior to surgery or surgery may need to be canceled/ delayed if the surgeon/ anesthesia feels like they are unable to be safely monitored.   Do not shave  48 hours prior to surgery.    Do not bring valuables to the hospital. [facility] IS NOT             RESPONSIBLE   FOR VALUABLES.   Contacts, glasses, dentures or bridgework may not be worn into surgery.   Bring small overnight bag day of surgery.   DO NOT BRING YOUR HOME MEDICATIONS TO THE HOSPITAL. PHARMACY WILL DISPENSE MEDICATIONS LISTED ON YOUR MEDICATION LIST TO YOU DURING YOUR ADMISSION IN THE HOSPITAL!    Patients discharged on the day of surgery will not be allowed to drive home.  Someone NEEDS to stay with you for the first 24 hours after anesthesia.   Special Instructions: Bring a copy of your healthcare power of attorney and living will documents the day of surgery if you  haven't scanned them before.              Please read over the following fact sheets you were given: IF YOU HAVE QUESTIONS ABOUT YOUR PRE-OP INSTRUCTIONS PLEASE CALL [phone].   If you received a COVID test during your pre-op visit  it is requested that you wear a mask when out in public, stay away from anyone that may not be feeling well and notify your surgeon if you develop symptoms. If you test positive for Covid or have been in contact with anyone that has tested positive in the last 10 days please notify you surgeon.    [facility] - Preparing for Surgery Before surgery, you can play an important role.  Because skin is not sterile, your skin needs to be as free of germs as possible.  You can reduce the number of germs on your skin by washing with CHG (chlorahexidine gluconate) soap before surgery.  CHG is an antiseptic cleaner which kills germs and bonds with the skin to continue killing germs even after washing. Please DO NOT use if you have an allergy to CHG or antibacterial soaps.  If your skin becomes reddened/irritated stop using the CHG and inform your nurse when you arrive at Short Stay. Do not shave (including legs and underarms) for at least 48 hours prior to the first CHG shower.  You may shave your face/neck.  Please follow these instructions carefully:  1.  Shower with CHG Soap the night before surgery and the  morning of surgery.  2.  If you choose to wash your hair,  wash your hair first as usual with your normal  shampoo.  3.  After you shampoo, rinse your hair and body thoroughly to remove the shampoo.                             4.  Use CHG as you would any other liquid soap.  You can apply chg directly to the skin and wash.  Gently with a scrungie or clean washcloth.  5.  Apply the CHG Soap to your body ONLY FROM THE NECK DOWN.   Do   not use on face/ open                           Wound or open sores. Avoid contact with eyes, ears mouth and   genitals (private parts).                        Wash face,  Genitals (private parts) with your normal soap.             6.  Wash thoroughly, paying special attention to the area where your    surgery  will be performed.  7.  Thoroughly rinse your body with warm water  from the neck down.  8.  DO NOT shower/wash with your normal soap after using and rinsing off the CHG Soap.                9.  Pat yourself dry with a clean towel.            10.  Wear clean pajamas.            11.  Place clean sheets on your bed the night of your first shower and do not  sleep with pets. Day of Surgery : Do not apply any lotions/deodorants the morning of surgery.  Please wear clean clothes to the hospital/surgery center.  FAILURE TO FOLLOW THESE INSTRUCTIONS MAY RESULT IN THE CANCELLATION OF YOUR SURGERY  PATIENT SIGNATURE_________________________________  NURSE SIGNATURE__________________________________  ________________________________________________________________________ Nasario Exon (Watch this video at home: [email or web address])  An incentive spirometer is a tool that can help keep your lungs clear and active. This tool measures how well you are filling your lungs with each breath. Taking long deep breaths may help reverse or decrease the chance of developing breathing (pulmonary) problems (especially infection) following: A long period of time when you are unable to move or be active. BEFORE THE PROCEDURE  If the spirometer includes an indicator to show your best effort, your nurse or respiratory therapist will set it to a desired goal. If possible, sit up straight or lean slightly forward. Try not to slouch. Hold the incentive spirometer in an upright position. INSTRUCTIONS FOR USE  Sit on the edge of your bed if possible, or sit up as far as you can in bed or on a chair. Hold the incentive spirometer in an upright position. Breathe out normally. Place the mouthpiece in your mouth and seal your  lips tightly around it. Breathe in slowly and as deeply as possible, raising the piston or the ball toward the top of the column. Hold your breath for 3-5 seconds or for as long as possible. Allow the piston or ball to fall to the bottom of the column. Remove the mouthpiece from your mouth and breathe out normally. Rest for a few seconds and repeat Steps 1 through  7 at least 10 times every 1-2 hours when you are awake. Take your time and take a few normal breaths between deep breaths. The spirometer may include an indicator to show your best effort. Use the indicator as a goal to work toward during each repetition. After each set of 10 deep breaths, practice coughing to be sure your lungs are clear. If you have an incision (the cut made at the time of surgery), support your incision when coughing by placing a pillow or rolled up towels firmly against it. Once you are able to get out of bed, walk around indoors and cough well. You may stop using the incentive spirometer when instructed by your caregiver.  RISKS AND COMPLICATIONS Take your time so you do not get dizzy or light-headed. If you are in pain, you may need to take or ask for pain medication before doing incentive spirometry. It is harder to take a deep breath if you are having pain. AFTER USE Rest and breathe slowly and easily. It can be helpful to keep track of a log of your progress. Your caregiver can provide you with a simple table to help with this. If you are using the spirometer at home, follow these instructions: SEEK MEDICAL CARE IF:  You are having difficultly using the spirometer. You have trouble using the spirometer as often as instructed. Your pain medication is not giving enough relief while using the spirometer. You develop fever of 100.5 F (38.1 C) or higher. SEEK IMMEDIATE MEDICAL CARE IF:  You cough up bloody sputum that had not been present before. You develop fever of 102 F (38.9 C) or greater. You develop  worsening pain at or near the incision site. MAKE SURE YOU:  Understand these instructions. Will watch your condition. Will get help right away if you are not doing well or get worse. Document Released: 12/24/2006 Document Revised: 11/05/2011 Document Reviewed: 02/24/2007 [facility] Patient Information 2014 [place], [place]. WHAT IS A BLOOD TRANSFUSION? Blood Transfusion Information  A transfusion is the replacement of blood or some of its parts. Blood is made up of multiple cells which provide different functions. Red blood cells carry oxygen and are used for blood loss replacement. White blood cells fight against infection. Platelets control bleeding. Plasma helps clot blood. Other blood products are available for specialized needs, such as hemophilia or other clotting disorders. BEFORE THE TRANSFUSION  Who gives blood for transfusions?  Healthy volunteers who are fully evaluated to make sure their blood is safe. This is blood bank blood. Transfusion therapy is the safest it has ever been in the practice of medicine. Before blood is taken from a donor, a complete history is taken to make sure that person has no history of diseases nor engages in risky social behavior (examples are intravenous drug use or sexual activity with multiple partners). The donor's travel history is screened to minimize risk of transmitting infections, such as malaria. The donated blood is tested for signs of infectious diseases, such as HIV and hepatitis. The blood is then tested to be sure it is compatible with you in order to minimize the chance of a transfusion reaction. If you or a relative donates blood, this is often done in anticipation of surgery and is not appropriate for emergency situations. It takes many days to process the donated blood. RISKS AND COMPLICATIONS Although transfusion therapy is very safe and saves many lives, the main dangers of transfusion include:  Getting an infectious disease. Developing  a transfusion reaction. This  is an allergic reaction to something in the blood you were given. Every precaution is taken to prevent this. The decision to have a blood transfusion has been considered carefully by your caregiver before blood is given. Blood is not given unless the benefits outweigh the risks. AFTER THE TRANSFUSION Right after receiving a blood transfusion, you will usually feel much better and more energetic. This is especially true if your red blood cells have gotten low (anemic). The transfusion raises the level of the red blood cells which carry oxygen, and this usually causes an energy increase. The nurse administering the transfusion will monitor you carefully for complications. HOME CARE INSTRUCTIONS  No special instructions are needed after a transfusion. You may find your energy is better. Speak with your caregiver about any limitations on activity for underlying diseases you may have. SEEK MEDICAL CARE IF:  Your condition is not improving after your transfusion. You develop redness or irritation at the intravenous (IV) site. SEEK IMMEDIATE MEDICAL CARE IF:  Any of the following symptoms occur over the next 12 hours: Shaking chills. You have a temperature by mouth above 102 F (38.9 C), not controlled by medicine. Chest, back, or muscle pain. People around you feel you are not acting correctly or are confused. Shortness of breath or difficulty breathing. Dizziness and fainting. You get a rash or develop hives. You have a decrease in urine output. Your urine turns a dark color or changes to pink, red, or brown. Any of the following symptoms occur over the next 10 days: You have a temperature by mouth above 102 F (38.9 C), not controlled by medicine. Shortness of breath. Weakness after normal activity. The white part of the eye turns yellow (jaundice). You have a decrease in the amount of urine or are urinating less often. Your urine turns a dark color or changes to  pink, red, or brown. Document Released: 08/10/2000 Document Revised: 11/05/2011 Document Reviewed: 03/29/2008 [facility] Patient Information 2014 [place], [place].  _______________________________________________________________________

## 2024-04-30 ENCOUNTER — Inpatient Hospital Stay (HOSPITAL_COMMUNITY)

## 2024-04-30 ENCOUNTER — Inpatient Hospital Stay (HOSPITAL_COMMUNITY)
Admission: RE | Admit: 2024-04-30 | Discharge: 2024-05-08 | DRG: 467 | Disposition: A | Discharge status: Home / Self Care | Attending: Orthopedic Surgery | Admitting: Orthopedic Surgery

## 2024-04-30 ENCOUNTER — Encounter (HOSPITAL_COMMUNITY): Payer: Self-pay | Admitting: Orthopedic Surgery

## 2024-04-30 ENCOUNTER — Inpatient Hospital Stay (HOSPITAL_COMMUNITY): Payer: Self-pay | Admitting: Certified Registered"

## 2024-04-30 ENCOUNTER — Encounter (HOSPITAL_COMMUNITY)
Admission: RE | Disposition: A | Discharge status: Home / Self Care | Payer: Self-pay | Source: Home / Self Care | Attending: Orthopedic Surgery

## 2024-04-30 DIAGNOSIS — M25552 Pain in left hip: Secondary | ICD-10-CM | POA: Diagnosis present

## 2024-04-30 DIAGNOSIS — E0590 Thyrotoxicosis, unspecified without thyrotoxic crisis or storm: Secondary | ICD-10-CM | POA: Diagnosis present

## 2024-04-30 DIAGNOSIS — I10 Essential (primary) hypertension: Secondary | ICD-10-CM | POA: Diagnosis not present

## 2024-04-30 DIAGNOSIS — Y792 Prosthetic and other implants, materials and accessory orthopedic devices associated with adverse incidents: Secondary | ICD-10-CM | POA: Diagnosis present

## 2024-04-30 DIAGNOSIS — G43909 Migraine, unspecified, not intractable, without status migrainosus: Secondary | ICD-10-CM | POA: Diagnosis present

## 2024-04-30 DIAGNOSIS — T84091A Other mechanical complication of internal left hip prosthesis, initial encounter: Secondary | ICD-10-CM

## 2024-04-30 DIAGNOSIS — Z90710 Acquired absence of both cervix and uterus: Secondary | ICD-10-CM

## 2024-04-30 DIAGNOSIS — F418 Other specified anxiety disorders: Secondary | ICD-10-CM

## 2024-04-30 DIAGNOSIS — F319 Bipolar disorder, unspecified: Secondary | ICD-10-CM | POA: Diagnosis present

## 2024-04-30 DIAGNOSIS — Z7983 Long term (current) use of bisphosphonates: Secondary | ICD-10-CM | POA: Diagnosis not present

## 2024-04-30 DIAGNOSIS — F411 Generalized anxiety disorder: Secondary | ICD-10-CM | POA: Diagnosis present

## 2024-04-30 DIAGNOSIS — I129 Hypertensive chronic kidney disease with stage 1 through stage 4 chronic kidney disease, or unspecified chronic kidney disease: Secondary | ICD-10-CM | POA: Diagnosis present

## 2024-04-30 DIAGNOSIS — Z818 Family history of other mental and behavioral disorders: Secondary | ICD-10-CM

## 2024-04-30 DIAGNOSIS — S32412A Displaced fracture of anterior wall of left acetabulum, initial encounter for closed fracture: Secondary | ICD-10-CM | POA: Diagnosis present

## 2024-04-30 DIAGNOSIS — Z96641 Presence of right artificial hip joint: Secondary | ICD-10-CM | POA: Diagnosis present

## 2024-04-30 DIAGNOSIS — Z87891 Personal history of nicotine dependence: Secondary | ICD-10-CM

## 2024-04-30 DIAGNOSIS — M8448XA Pathological fracture, other site, initial encounter for fracture: Secondary | ICD-10-CM | POA: Diagnosis present

## 2024-04-30 DIAGNOSIS — D62 Acute posthemorrhagic anemia: Secondary | ICD-10-CM | POA: Diagnosis not present

## 2024-04-30 DIAGNOSIS — Z79899 Other long term (current) drug therapy: Secondary | ICD-10-CM | POA: Diagnosis not present

## 2024-04-30 DIAGNOSIS — M1990 Unspecified osteoarthritis, unspecified site: Secondary | ICD-10-CM | POA: Diagnosis present

## 2024-04-30 DIAGNOSIS — G4730 Sleep apnea, unspecified: Secondary | ICD-10-CM | POA: Diagnosis present

## 2024-04-30 DIAGNOSIS — N182 Chronic kidney disease, stage 2 (mild): Secondary | ICD-10-CM | POA: Diagnosis present

## 2024-04-30 DIAGNOSIS — Z881 Allergy status to other antibiotic agents status: Secondary | ICD-10-CM | POA: Diagnosis not present

## 2024-04-30 DIAGNOSIS — M9702XA Periprosthetic fracture around internal prosthetic left hip joint, initial encounter: Secondary | ICD-10-CM | POA: Diagnosis present

## 2024-04-30 DIAGNOSIS — Z96642 Presence of left artificial hip joint: Secondary | ICD-10-CM | POA: Diagnosis present

## 2024-04-30 DIAGNOSIS — Z882 Allergy status to sulfonamides status: Secondary | ICD-10-CM

## 2024-04-30 DIAGNOSIS — Z01818 Encounter for other preprocedural examination: Secondary | ICD-10-CM

## 2024-04-30 HISTORY — PX: ANTERIOR HIP REVISION: SHX6527

## 2024-04-30 HISTORY — DX: Personal history of other medical treatment: Z92.89

## 2024-04-30 LAB — POCT I-STAT EG7
Acid-base deficit: 4 mmol/L — ABNORMAL HIGH (ref 0.0–2.0)
Acid-base deficit: 7 mmol/L — ABNORMAL HIGH (ref 0.0–2.0)
Acid-base deficit: 7 mmol/L — ABNORMAL HIGH (ref 0.0–2.0)
Bicarbonate: 21.2 mmol/L (ref 20.0–28.0)
Bicarbonate: 20 mmol/L (ref 20.0–28.0)
Bicarbonate: 19.1 mmol/L — ABNORMAL LOW (ref 20.0–28.0)
Calcium, Ion: 1.23 mmol/L (ref 1.15–1.40)
Calcium, Ion: 1.15 mmol/L (ref 1.15–1.40)
Calcium, Ion: 1.16 mmol/L (ref 1.15–1.40)
HCT: 22 % — ABNORMAL LOW (ref 36.0–46.0)
HCT: 25 % — ABNORMAL LOW (ref 36.0–46.0)
HCT: 19 % — ABNORMAL LOW (ref 36.0–46.0)
Hemoglobin: 7.5 g/dL — ABNORMAL LOW (ref 12.0–15.0)
Hemoglobin: 8.5 g/dL — ABNORMAL LOW (ref 12.0–15.0)
Hemoglobin: 6.5 g/dL — CL (ref 12.0–15.0)
O2 Saturation: 99 %
O2 Saturation: 83 %
O2 Saturation: 81 %
Potassium: 3.7 mmol/L (ref 3.5–5.1)
Potassium: 3.7 mmol/L (ref 3.5–5.1)
Potassium: 4.2 mmol/L (ref 3.5–5.1)
Sodium: 139 mmol/L (ref 135–145)
Sodium: 141 mmol/L (ref 135–145)
Sodium: 140 mmol/L (ref 135–145)
TCO2: 22 mmol/L (ref 22–32)
TCO2: 22 mmol/L (ref 22–32)
TCO2: 20 mmol/L — ABNORMAL LOW (ref 22–32)
pCO2, Ven: 40.7 mmHg — ABNORMAL LOW (ref 44–60)
pCO2, Ven: 49.9 mmHg (ref 44–60)
pCO2, Ven: 42.7 mmHg — ABNORMAL LOW (ref 44–60)
pH, Ven: 7.325 (ref 7.25–7.43)
pH, Ven: 7.212 — ABNORMAL LOW (ref 7.25–7.43)
pH, Ven: 7.258 (ref 7.25–7.43)
pO2, Ven: 140 mmHg — ABNORMAL HIGH (ref 32–45)
pO2, Ven: 57 mmHg — ABNORMAL HIGH (ref 32–45)
pO2, Ven: 52 mmHg — ABNORMAL HIGH (ref 32–45)

## 2024-04-30 LAB — PREPARE RBC (CROSSMATCH)

## 2024-04-30 LAB — CBC
HCT: 32.3 % — ABNORMAL LOW (ref 36.0–46.0)
Hemoglobin: 10.5 g/dL — ABNORMAL LOW (ref 12.0–15.0)
MCH: 30.1 pg (ref 26.0–34.0)
MCHC: 32.5 g/dL (ref 30.0–36.0)
MCV: 92.6 fL (ref 80.0–100.0)
Platelets: 151 K/uL (ref 150–400)
RBC: 3.49 MIL/uL — ABNORMAL LOW (ref 3.87–5.11)
RDW: 15.7 % — ABNORMAL HIGH (ref 11.5–15.5)
WBC: 10.6 K/uL — ABNORMAL HIGH (ref 4.0–10.5)
nRBC: 0 % (ref 0.0–0.2)

## 2024-04-30 SURGERY — REVISION, TOTAL ARTHROPLASTY, HIP, ANTERIOR APPROACH
Anesthesia: General | Site: Hip | Laterality: Left

## 2024-04-30 MED ORDER — CALCIUM CARB-CHOLECALCIFEROL 600-10 MG-MCG PO TABS: 2.0000 | ORAL_TABLET | Freq: Every day | ORAL | Status: DC

## 2024-04-30 MED ORDER — LAMOTRIGINE 100 MG PO TABS
200.0000 mg | ORAL_TABLET | Freq: Every morning | ORAL | Status: DC
  Administered 2024-05-01 – 2024-05-08 (×8): 200 mg via ORAL
  Filled 2024-04-30 (×8): qty 2

## 2024-04-30 MED ORDER — SUMATRIPTAN SUCCINATE 50 MG PO TABS: 100.0000 mg | ORAL_TABLET | ORAL | Status: DC | PRN

## 2024-04-30 MED ORDER — LACTATED RINGERS IV SOLN: INTRAVENOUS | Status: DC

## 2024-04-30 MED ORDER — FENTANYL CITRATE PF 50 MCG/ML IJ SOSY
PREFILLED_SYRINGE | INTRAMUSCULAR | Status: AC
  Filled 2024-04-30: qty 2

## 2024-04-30 MED ORDER — KETAMINE HCL 50 MG/5ML IJ SOSY
PREFILLED_SYRINGE | INTRAMUSCULAR | Status: DC | PRN
  Administered 2024-04-30: 20 mg via INTRAVENOUS
  Administered 2024-04-30: 10 mg via INTRAVENOUS

## 2024-04-30 MED ORDER — ONDANSETRON HCL 4 MG/2ML IJ SOLN
INTRAMUSCULAR | Status: DC | PRN
  Administered 2024-04-30: 4 mg via INTRAVENOUS

## 2024-04-30 MED ORDER — ROCURONIUM BROMIDE 10 MG/ML (PF) SYRINGE
PREFILLED_SYRINGE | INTRAVENOUS | Status: AC
  Filled 2024-04-30: qty 10

## 2024-04-30 MED ORDER — TOPIRAMATE 100 MG PO TABS
200.0000 mg | ORAL_TABLET | Freq: Every day | ORAL | Status: DC
  Administered 2024-05-01 – 2024-05-07 (×8): 200 mg via ORAL
  Filled 2024-04-30 (×8): qty 2

## 2024-04-30 MED ORDER — POVIDONE-IODINE 10 % EX SWAB: 2.0000 | Freq: Once | CUTANEOUS | Status: DC

## 2024-04-30 MED ORDER — FENTANYL CITRATE (PF) 100 MCG/2ML IJ SOLN
INTRAMUSCULAR | Status: AC
  Filled 2024-04-30: qty 2

## 2024-04-30 MED ORDER — METHOCARBAMOL 500 MG PO TABS
500.0000 mg | ORAL_TABLET | Freq: Four times a day (QID) | ORAL | Status: DC | PRN
  Administered 2024-04-30 – 2024-05-07 (×8): 500 mg via ORAL
  Filled 2024-04-30 (×8): qty 1

## 2024-04-30 MED ORDER — SODIUM CHLORIDE 0.9 % IR SOLN
Status: DC | PRN
  Administered 2024-04-30: 500 mL

## 2024-04-30 MED ORDER — MIDAZOLAM HCL 2 MG/2ML IJ SOLN
INTRAMUSCULAR | Status: AC
  Filled 2024-04-30: qty 2

## 2024-04-30 MED ORDER — PROPOFOL 10 MG/ML IV BOLUS
INTRAVENOUS | Status: DC | PRN
  Administered 2024-04-30: 120 mg via INTRAVENOUS

## 2024-04-30 MED ORDER — SODIUM CHLORIDE (PF) 0.9 % IJ SOLN
INTRAMUSCULAR | Status: AC
  Filled 2024-04-30: qty 50

## 2024-04-30 MED ORDER — PHENOL 1.4 % MT LIQD: 1.0000 | OROMUCOSAL | Status: DC | PRN

## 2024-04-30 MED ORDER — QUETIAPINE FUMARATE 50 MG PO TABS
350.0000 mg | ORAL_TABLET | Freq: Every day | ORAL | Status: DC
  Administered 2024-05-01 – 2024-05-07 (×8): 350 mg via ORAL
  Filled 2024-04-30 (×8): qty 7

## 2024-04-30 MED ORDER — ACETAMINOPHEN 325 MG PO TABS: 325.0000 mg | ORAL_TABLET | Freq: Four times a day (QID) | ORAL | Status: DC | PRN

## 2024-04-30 MED ORDER — MIDAZOLAM HCL 2 MG/2ML IJ SOLN
INTRAMUSCULAR | Status: DC | PRN
  Administered 2024-04-30: 2 mg via INTRAVENOUS

## 2024-04-30 MED ORDER — CEFAZOLIN SODIUM-DEXTROSE 2-4 GM/100ML-% IV SOLN
2.0000 g | INTRAVENOUS | Status: AC
  Administered 2024-04-30 (×2): 2 g via INTRAVENOUS
  Filled 2024-04-30: qty 100

## 2024-04-30 MED ORDER — VANCOMYCIN HCL 1000 MG IV SOLR
INTRAVENOUS | Status: DC | PRN
  Administered 2024-04-30: 1000 mg

## 2024-04-30 MED ORDER — METOCLOPRAMIDE HCL 5 MG PO TABS: 5.0000 mg | ORAL_TABLET | Freq: Three times a day (TID) | ORAL | Status: DC | PRN

## 2024-04-30 MED ORDER — ALBUMIN HUMAN 5 % IV SOLN
INTRAVENOUS | Status: AC
Start: 2024-04-30 — End: 2024-04-30
  Filled 2024-04-30: qty 500

## 2024-04-30 MED ORDER — BUPIVACAINE-EPINEPHRINE (PF) 0.25% -1:200000 IJ SOLN
INTRAMUSCULAR | Status: AC
  Filled 2024-04-30: qty 30

## 2024-04-30 MED ORDER — METOCLOPRAMIDE HCL 5 MG/ML IJ SOLN: 5.0000 mg | Freq: Three times a day (TID) | INTRAMUSCULAR | Status: DC | PRN

## 2024-04-30 MED ORDER — FENTANYL CITRATE PF 50 MCG/ML IJ SOSY
25.0000 ug | PREFILLED_SYRINGE | INTRAMUSCULAR | Status: DC | PRN
  Administered 2024-04-30: 50 ug via INTRAVENOUS

## 2024-04-30 MED ORDER — ORAL CARE MOUTH RINSE: 15.0000 mL | Freq: Once | OROMUCOSAL | Status: AC

## 2024-04-30 MED ORDER — SUGAMMADEX SODIUM 200 MG/2ML IV SOLN
INTRAVENOUS | Status: DC | PRN
  Administered 2024-04-30 (×2): 100 mg via INTRAVENOUS

## 2024-04-30 MED ORDER — SODIUM CHLORIDE 0.9 % IR SOLN
Status: DC | PRN
  Administered 2024-04-30: 3000 mL

## 2024-04-30 MED ORDER — VANCOMYCIN HCL 1000 MG IV SOLR
INTRAVENOUS | Status: AC
Start: 2024-04-30 — End: 2024-04-30
  Filled 2024-04-30: qty 20

## 2024-04-30 MED ORDER — DIPHENHYDRAMINE HCL 12.5 MG/5ML PO ELIX: 12.5000 mg | ORAL_SOLUTION | ORAL | Status: DC | PRN

## 2024-04-30 MED ORDER — KETOROLAC TROMETHAMINE 30 MG/ML IJ SOLN
INTRAMUSCULAR | Status: AC
  Filled 2024-04-30: qty 1

## 2024-04-30 MED ORDER — DEXMEDETOMIDINE HCL IN NACL 80 MCG/20ML IV SOLN
INTRAVENOUS | Status: AC
  Filled 2024-04-30: qty 20

## 2024-04-30 MED ORDER — SODIUM CHLORIDE 0.9 % IR SOLN
Status: DC | PRN
  Administered 2024-04-30: 1000 mL

## 2024-04-30 MED ORDER — GLYCOPYRROLATE 0.2 MG/ML IJ SOLN
INTRAMUSCULAR | Status: DC | PRN
  Administered 2024-04-30: .1 mg via INTRAVENOUS

## 2024-04-30 MED ORDER — KETAMINE HCL 50 MG/5ML IJ SOSY
PREFILLED_SYRINGE | INTRAMUSCULAR | Status: AC
  Filled 2024-04-30: qty 5

## 2024-04-30 MED ORDER — MORPHINE SULFATE (PF) 2 MG/ML IV SOLN: 0.5000 mg | INTRAVENOUS | Status: DC | PRN

## 2024-04-30 MED ORDER — HYDROCHLOROTHIAZIDE 25 MG PO TABS
25.0000 mg | ORAL_TABLET | Freq: Every day | ORAL | Status: DC
  Administered 2024-05-02 – 2024-05-08 (×7): 25 mg via ORAL
  Filled 2024-04-30 (×8): qty 1

## 2024-04-30 MED ORDER — ONDANSETRON HCL 4 MG/2ML IJ SOLN: 4.0000 mg | Freq: Four times a day (QID) | INTRAMUSCULAR | Status: DC | PRN

## 2024-04-30 MED ORDER — SODIUM CHLORIDE 0.9% IV SOLUTION: Freq: Once | INTRAVENOUS | Status: DC

## 2024-04-30 MED ORDER — ISOPROPYL ALCOHOL 70 % SOLN
Status: AC
  Filled 2024-04-30: qty 480

## 2024-04-30 MED ORDER — LIDOCAINE HCL (CARDIAC) PF 100 MG/5ML IV SOSY
PREFILLED_SYRINGE | INTRAVENOUS | Status: DC | PRN
  Administered 2024-04-30: 40 mg via INTRAVENOUS

## 2024-04-30 MED ORDER — SUGAMMADEX SODIUM 200 MG/2ML IV SOLN
INTRAVENOUS | Status: AC
  Filled 2024-04-30: qty 2

## 2024-04-30 MED ORDER — HYDROCODONE-ACETAMINOPHEN 5-325 MG PO TABS
1.0000 | ORAL_TABLET | ORAL | Status: DC | PRN
  Administered 2024-05-01: 2 via ORAL
  Administered 2024-05-01: 1 via ORAL
  Administered 2024-05-01 – 2024-05-05 (×14): 2 via ORAL
  Administered 2024-05-06: 1 via ORAL
  Administered 2024-05-06: 2 via ORAL
  Administered 2024-05-06: 1 via ORAL
  Administered 2024-05-06 – 2024-05-07 (×2): 2 via ORAL
  Filled 2024-04-30 (×8): qty 2
  Filled 2024-04-30: qty 1
  Filled 2024-04-30 (×5): qty 2
  Filled 2024-04-30: qty 1
  Filled 2024-04-30 (×5): qty 2
  Filled 2024-04-30: qty 1
  Filled 2024-04-30: qty 2

## 2024-04-30 MED ORDER — KETOROLAC TROMETHAMINE 15 MG/ML IJ SOLN
7.5000 mg | Freq: Four times a day (QID) | INTRAMUSCULAR | Status: AC
  Administered 2024-04-30 – 2024-05-01 (×4): 7.5 mg via INTRAVENOUS
  Filled 2024-04-30 (×4): qty 1

## 2024-04-30 MED ORDER — BISACODYL 10 MG RE SUPP: 10.0000 mg | Freq: Every day | RECTAL | Status: DC | PRN

## 2024-04-30 MED ORDER — ONDANSETRON HCL 4 MG/2ML IJ SOLN
INTRAMUSCULAR | Status: AC
  Filled 2024-04-30: qty 2

## 2024-04-30 MED ORDER — ALUM & MAG HYDROXIDE-SIMETH 200-200-20 MG/5ML PO SUSP: 30.0000 mL | ORAL | Status: DC | PRN

## 2024-04-30 MED ORDER — VANCOMYCIN HCL IN DEXTROSE 1-5 GM/200ML-% IV SOLN
INTRAVENOUS | Status: AC
  Filled 2024-04-30: qty 200

## 2024-04-30 MED ORDER — DOCUSATE SODIUM 100 MG PO CAPS
100.0000 mg | ORAL_CAPSULE | Freq: Two times a day (BID) | ORAL | Status: DC
  Administered 2024-04-30 – 2024-05-08 (×16): 100 mg via ORAL
  Filled 2024-04-30 (×16): qty 1

## 2024-04-30 MED ORDER — MENTHOL 3 MG MT LOZG: 1.0000 | LOZENGE | OROMUCOSAL | Status: DC | PRN

## 2024-04-30 MED ORDER — ASPIRIN 81 MG PO CHEW
81.0000 mg | CHEWABLE_TABLET | Freq: Two times a day (BID) | ORAL | Status: DC
  Administered 2024-04-30 – 2024-05-08 (×16): 81 mg via ORAL
  Filled 2024-04-30 (×16): qty 1

## 2024-04-30 MED ORDER — CEFAZOLIN SODIUM-DEXTROSE 2-4 GM/100ML-% IV SOLN
2.0000 g | Freq: Four times a day (QID) | INTRAVENOUS | Status: AC
  Administered 2024-04-30 – 2024-05-01 (×2): 2 g via INTRAVENOUS
  Filled 2024-04-30 (×2): qty 100

## 2024-04-30 MED ORDER — ALBUMIN HUMAN 5 % IV SOLN
INTRAVENOUS | Status: DC | PRN
Start: 2024-04-30 — End: 2024-04-30

## 2024-04-30 MED ORDER — PHENYLEPHRINE HCL-NACL 20-0.9 MG/250ML-% IV SOLN
INTRAVENOUS | Status: DC | PRN
  Administered 2024-04-30: 40 ug/min via INTRAVENOUS

## 2024-04-30 MED ORDER — OYSTER SHELL CALCIUM/D3 500-5 MG-MCG PO TABS
2.0000 | ORAL_TABLET | Freq: Every day | ORAL | Status: DC
  Administered 2024-05-01 – 2024-05-08 (×8): 2 via ORAL
  Filled 2024-04-30 (×8): qty 2

## 2024-04-30 MED ORDER — EPHEDRINE 5 MG/ML INJ
INTRAVENOUS | Status: AC
  Filled 2024-04-30: qty 5

## 2024-04-30 MED ORDER — METHOCARBAMOL 1000 MG/10ML IJ SOLN: 500.0000 mg | Freq: Four times a day (QID) | INTRAMUSCULAR | Status: DC | PRN

## 2024-04-30 MED ORDER — QUETIAPINE FUMARATE 50 MG PO TABS: 50.0000 mg | ORAL_TABLET | ORAL | Status: DC

## 2024-04-30 MED ORDER — ROCURONIUM BROMIDE 10 MG/ML (PF) SYRINGE
PREFILLED_SYRINGE | INTRAVENOUS | Status: DC | PRN
  Administered 2024-04-30: 50 mg via INTRAVENOUS
  Administered 2024-04-30: 20 mg via INTRAVENOUS
  Administered 2024-04-30: 10 mg via INTRAVENOUS
  Administered 2024-04-30 (×6): 20 mg via INTRAVENOUS

## 2024-04-30 MED ORDER — FENTANYL CITRATE (PF) 100 MCG/2ML IJ SOLN
INTRAMUSCULAR | Status: DC | PRN
Start: 2024-04-30 — End: 2024-04-30
  Administered 2024-04-30 (×4): 50 ug via INTRAVENOUS

## 2024-04-30 MED ORDER — ONDANSETRON HCL 4 MG/2ML IJ SOLN: 4.0000 mg | Freq: Once | INTRAMUSCULAR | Status: DC | PRN

## 2024-04-30 MED ORDER — MUPIROCIN 2 % EX OINT
1.0000 | TOPICAL_OINTMENT | Freq: Two times a day (BID) | CUTANEOUS | Status: DC
  Administered 2024-04-30 – 2024-05-08 (×14): 1 via TOPICAL
  Filled 2024-04-30 (×3): qty 22

## 2024-04-30 MED ORDER — SENNA 8.6 MG PO TABS
1.0000 | ORAL_TABLET | Freq: Two times a day (BID) | ORAL | Status: DC
  Administered 2024-05-01 – 2024-05-08 (×16): 8.6 mg via ORAL
  Filled 2024-04-30 (×17): qty 1

## 2024-04-30 MED ORDER — PANTOPRAZOLE SODIUM 40 MG PO TBEC
40.0000 mg | DELAYED_RELEASE_TABLET | Freq: Every day | ORAL | Status: DC
  Administered 2024-05-01 – 2024-05-08 (×9): 40 mg via ORAL
  Filled 2024-04-30 (×9): qty 1

## 2024-04-30 MED ORDER — ISOPROPYL ALCOHOL 70 % SOLN
Status: DC | PRN
  Administered 2024-04-30: 1 via TOPICAL

## 2024-04-30 MED ORDER — HYDROCODONE-ACETAMINOPHEN 7.5-325 MG PO TABS
1.0000 | ORAL_TABLET | ORAL | Status: DC | PRN
  Administered 2024-05-07 (×2): 1 via ORAL
  Administered 2024-05-07: 2 via ORAL
  Administered 2024-05-08: 1 via ORAL
  Filled 2024-04-30 (×2): qty 1
  Filled 2024-04-30: qty 2
  Filled 2024-04-30: qty 1

## 2024-04-30 MED ORDER — POVIDONE-IODINE 10 % EX SWAB
2.0000 | Freq: Once | CUTANEOUS | Status: AC
  Administered 2024-04-30: 2 via TOPICAL

## 2024-04-30 MED ORDER — ACETAMINOPHEN 500 MG PO TABS
1000.0000 mg | ORAL_TABLET | Freq: Once | ORAL | Status: AC
  Administered 2024-04-30: 1000 mg via ORAL
  Filled 2024-04-30: qty 2

## 2024-04-30 MED ORDER — SODIUM CHLORIDE 0.9 % IV SOLN: INTRAVENOUS | Status: DC

## 2024-04-30 MED ORDER — CHLORHEXIDINE GLUCONATE 0.12 % MT SOLN
15.0000 mL | Freq: Once | OROMUCOSAL | Status: AC
Start: 2024-04-30 — End: 2024-04-30
  Administered 2024-04-30: 15 mL via OROMUCOSAL

## 2024-04-30 MED ORDER — SODIUM CHLORIDE (PF) 0.9 % IJ SOLN
INTRAMUSCULAR | Status: DC | PRN
  Administered 2024-04-30: 61 mL via SURGICAL_CAVITY

## 2024-04-30 MED ORDER — POLYETHYLENE GLYCOL 3350 17 G PO PACK
17.0000 g | PACK | Freq: Every day | ORAL | Status: DC | PRN
  Administered 2024-05-04 – 2024-05-08 (×2): 17 g via ORAL
  Filled 2024-04-30 (×2): qty 1

## 2024-04-30 MED ORDER — ALPRAZOLAM 0.5 MG PO TABS
0.7500 mg | ORAL_TABLET | Freq: Every day | ORAL | Status: DC
  Administered 2024-04-30 – 2024-05-07 (×8): 0.75 mg via ORAL
  Filled 2024-04-30 (×8): qty 1

## 2024-04-30 MED ORDER — PROPOFOL 10 MG/ML IV BOLUS
INTRAVENOUS | Status: AC
  Filled 2024-04-30: qty 20

## 2024-04-30 MED ORDER — DEXAMETHASONE SODIUM PHOSPHATE 10 MG/ML IJ SOLN
INTRAMUSCULAR | Status: DC | PRN
  Administered 2024-04-30: 8 mg via INTRAVENOUS

## 2024-04-30 MED ORDER — ONDANSETRON HCL 4 MG PO TABS: 4.0000 mg | ORAL_TABLET | Freq: Four times a day (QID) | ORAL | Status: DC | PRN

## 2024-04-30 MED ORDER — EPHEDRINE SULFATE-NACL 50-0.9 MG/10ML-% IV SOSY
PREFILLED_SYRINGE | INTRAVENOUS | Status: DC | PRN
Start: 2024-04-30 — End: 2024-04-30
  Administered 2024-04-30 (×2): 5 mg via INTRAVENOUS
  Administered 2024-04-30: 10 mg via INTRAVENOUS

## 2024-04-30 MED ORDER — DEXAMETHASONE SODIUM PHOSPHATE 10 MG/ML IJ SOLN
INTRAMUSCULAR | Status: AC
Start: 2024-04-30 — End: 2024-04-30
  Filled 2024-04-30: qty 1

## 2024-04-30 MED ORDER — CEFAZOLIN SODIUM 1 G IJ SOLR
INTRAMUSCULAR | Status: AC
  Filled 2024-04-30: qty 20

## 2024-04-30 MED ORDER — TRANEXAMIC ACID-NACL 1000-0.7 MG/100ML-% IV SOLN
1000.0000 mg | INTRAVENOUS | Status: AC
  Administered 2024-04-30: 1000 mg via INTRAVENOUS
  Filled 2024-04-30: qty 100

## 2024-04-30 SURGICAL SUPPLY — 71 items
BAG COUNTER SPONGE SURGICOUNT (BAG) IMPLANT
BAG ZIPLOCK 12X15 (MISCELLANEOUS) IMPLANT
BIT DRILL RINGLOC 3.2MMX20 (BIT) IMPLANT
BIT DRILL RINGLOC QUICK CONN (BIT) IMPLANT
BLADE SAW SGTL 11.0X1.19X90.0M (BLADE) IMPLANT
BOWL SMART MIX CTS (DISPOSABLE) IMPLANT
BUR OVAL CARBIDE 4.0 (BURR) IMPLANT
CANISTER WOUND CARE 500ML ATS (WOUND CARE) IMPLANT
CEMENT BONE REFOBACIN R1X40 US (Cement) IMPLANT
CHLORAPREP W/TINT 26 (MISCELLANEOUS) ×1 IMPLANT
COVER PERINEAL POST (MISCELLANEOUS) ×1 IMPLANT
COVER SURGICAL LIGHT HANDLE (MISCELLANEOUS) ×1 IMPLANT
DERMABOND ADVANCED .7 DNX12 (GAUZE/BANDAGES/DRESSINGS) ×1 IMPLANT
DRAPE IMP U-DRAPE 54X76 (DRAPES) ×1 IMPLANT
DRAPE SHEET LG 3/4 BI-LAMINATE (DRAPES) ×3 IMPLANT
DRAPE STERI IOBAN 125X83 (DRAPES) ×1 IMPLANT
DRAPE U-SHAPE 47X51 STRL (DRAPES) ×2 IMPLANT
DRESSING PREVENA PLUS CUSTOM (GAUZE/BANDAGES/DRESSINGS) IMPLANT
DRSG AQUACEL AG ADV 3.5X10 (GAUZE/BANDAGES/DRESSINGS) ×1 IMPLANT
DRSG TEGADERM 2-3/8X2-3/4 SM (GAUZE/BANDAGES/DRESSINGS) IMPLANT
ELECT BLADE TIP CTD 4 INCH (ELECTRODE) ×1 IMPLANT
EVACUATOR DRAINAGE 10X20 100CC (DRAIN) IMPLANT
EVACUATOR DRAINAGE 7X20 100CC (MISCELLANEOUS) IMPLANT
FACESHIELD WRAPAROUND OR TEAM (MASK) IMPLANT
GAUZE 4X4 16PLY ~~LOC~~+RFID DBL (SPONGE) ×1 IMPLANT
GAUZE SPONGE 2X2 8PLY STRL LF (GAUZE/BANDAGES/DRESSINGS) IMPLANT
GLOVE BIO SURGEON STRL SZ8.5 (GLOVE) ×2 IMPLANT
GLOVE BIOGEL M 7.0 STRL (GLOVE) ×1 IMPLANT
GLOVE BIOGEL PI IND STRL 7.5 (GLOVE) ×2 IMPLANT
GLOVE BIOGEL PI IND STRL 8 (GLOVE) ×1 IMPLANT
GLOVE BIOGEL PI IND STRL 8.5 (GLOVE) ×1 IMPLANT
GLOVE SURG LX STRL 7.5 STRW (GLOVE) ×2 IMPLANT
GOWN SPEC L3 XXLG W/TWL (GOWN DISPOSABLE) ×2 IMPLANT
GRAFT BNE CANC CHIPS 1-8 30CC (Bone Implant) IMPLANT
GRAFT BNE CANC CHIPS 1-8 5CC (Bone Implant) IMPLANT
GRAFT BNE CANC CUBE 1X1X1 30CC (Bone Implant) IMPLANT
HEAD SROM 36MM PLUS 12 IMPLANT
HOLDER FOLEY CATH W/STRAP (MISCELLANEOUS) ×1 IMPLANT
HOOD PEEL AWAY T7 (MISCELLANEOUS) ×4 IMPLANT
KIT DRSG PREVENA PLUS 7DAY 125 (MISCELLANEOUS) IMPLANT
KIT TURNOVER KIT A (KITS) ×1 IMPLANT
LINER ACETAB ~~LOC~~ H 36 G7 +5 (Liner) IMPLANT
NDL SPNL 18GX3.5 QUINCKE PK (NEEDLE) ×1 IMPLANT
NEEDLE SPNL 18GX3.5 QUINCKE PK (NEEDLE) ×1 IMPLANT
PACK ANTERIOR HIP CUSTOM (KITS) ×1 IMPLANT
PENCIL SMOKE EVACUATOR (MISCELLANEOUS) ×1 IMPLANT
PUTTY DBM STAGRAFT PLUS 10CC (Putty) IMPLANT
RESTRICTOR ACETAB HIP TM 26 (Orthopedic Implant) IMPLANT
RESTRICTOR ACETAB HIP TM 32 (Orthopedic Implant) IMPLANT
SAW OSC TIP CART 19.5X105X1.3 (SAW) ×1 IMPLANT
SCREW ACETABULAR 6.5X50 ST (Screw) IMPLANT
SCREW ACETABULAR HIP 6.5X25MM (Hips) IMPLANT
SCREW BN 30X6.5XST STRL ACTB (Screw) IMPLANT
SCREW BONE 6.5X20 (Screw) IMPLANT
SCREW BONE SELF TAP 6.5X40 (Screw) IMPLANT
SEALER BIPOLAR AQUA 6.0 (INSTRUMENTS) ×1 IMPLANT
SET HNDPC FAN SPRY TIP SCT (DISPOSABLE) IMPLANT
SHELL ACET G7 MH 60 SZG (Shell) IMPLANT
SHELL ACET G7 MH 64 SZH (Shell) IMPLANT
SOLUTION PRONTOSAN WOUND 350ML (IRRIGATION / IRRIGATOR) ×1 IMPLANT
SUT ETHIBOND NAB CT1 #1 30IN (SUTURE) ×2 IMPLANT
SUT MNCRL AB 3-0 PS2 18 (SUTURE) ×1 IMPLANT
SUT MNCRL AB 4-0 PS2 18 (SUTURE) ×1 IMPLANT
SUT MON AB 2-0 CT1 36 (SUTURE) ×2 IMPLANT
SUT NYLON 3 0 (SUTURE) IMPLANT
SUT STRATAFIX 14 PDO 48 VLT (SUTURE) ×1 IMPLANT
SUT VIC AB 2-0 CT1 TAPERPNT 27 (SUTURE) ×2 IMPLANT
SYR 50ML LL SCALE MARK (SYRINGE) ×1 IMPLANT
TOWEL GREEN STERILE FF (TOWEL DISPOSABLE) ×1 IMPLANT
TRAY FOLEY MTR SLVR 16FR STAT (SET/KITS/TRAYS/PACK) ×1 IMPLANT
WATER STERILE IRR 1000ML POUR (IV SOLUTION) ×1 IMPLANT

## 2024-04-30 NOTE — Op Note (Signed)
 OPERATIVE REPORT   04/30/2024  5:53 PM  PATIENT:  Heather Gray   SURGEON:  Redell JINNY Shoals, MD  ASSISTANT:  Valery Potters, PA-C.   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   POSTOPERATIVE DIAGNOSIS:  Same.   PROCEDURE:  Revision left total hip arthroplasty, acetabular component.   ANESTHESIA:   GETA.   ANTIBIOTICS:  2g Ancef . 1g vancomycin .   EXPLANTS:  Biomet G7 Osseo Ti multihole acetabular shell, size 56 mm. 6.5 mm cancellous bone screw x 3. Vivacit-E polyliner, F, 36 mm +5 mm neutral. DePuy metal head ball, size 36+12 mm, 11/13 taper.   IMPLANTS: Biomet G7 Osseo Ti multihole acetabular shell, size 64 mm, H. Zimmer trabecular metal acetabular restrictor 32 mm. Similar trabecular metal acetabular restrictor size 26 mm. Vivacit-E poly liner, size 36 mm, +5 mm. 6.5 mm cancellous bone screw x 3. DePuy metal head ball, size 36+12 mm, 11/13 taper.  SPECIMENS: None.  TUBES AND DRAINS: 10 mm flat JP drain in subcutaneous tissue. Customizable Prevena negative pressure dressing at 75 mmHg.   ESTIMATED BLOOD LOSS: 1000 cc.   BLOOD PRODUCTS: 3 units PRBCs.   COMPLICATIONS: None.   DISPOSITION: Stable to PACU.  SURGICAL INDICATIONS:  Heather Gray is a 73 y.o. female who underwent acetabular component revision on 03/26/2024 for painful metal-on-metal total hip arthroplasty with adverse local tissue reaction and elevated metal ions.  At her 2-week postop visit, she was found to have failure of the acetabular component with a Prosky IIC acetabular defect.  She was indicated for revision acetabular component.  We discussed the risks, benefits, and alternatives, and she elected to proceed.  PROCEDURE IN DETAIL: PROCEDURE IN DETAIL: The patient was identified in the holding area using 2 identifiers.  The surgical site was marked by myself.  She was taken to the operating room, and general anesthesia was obtained.   She was transferred to the [facility] table.  All bony prominences were well-padded.  The left hip was prepped and draped in the normal sterile surgical fashion.  Timeout was called, verifying side and site of surgery.  She did receive IV antibiotics within 60 minutes of beginning the procedure.   Previous bikini incision was utilized, and superficial dissection was performed lateral to the ASIS.  Standard anterior approach of the hip was performed through the Heuter interval.  There was a benign-appearing seroma, which was evacuated.  The hip was gently dislocated with a bone hook.  Using a bone tamp, I easily removed the head ball and spacer from the trunnion.  There was no significant damage noted to the trunnion.   The trunnion was placed posteriorly and acetabular retractors were positioned.  The acetabular component was oriented in an excessively horizontal position and found to be medialized.  Drill was used to make a hole within the polyliner, and a 20 mm screw was inserted into the liner, which loosened the locking mechanism.  The liner was removed.  I identified the 3 acetabular screws, which were removed with a screwdriver.  I then easily removed the acetabular component by hand.  I inspected the native socket.  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  The anterior wall was compromised.  The posterior wall and posterior column were intact.  Scar tissue was excisionally debrided from the periphery of the native socket.  I sequentially reamed up to a 59 mm reamer to debride the native bone.  On the back table,  a 26 mm and a 32 mm acetabular restrictor were open and cement was used to fix them together.  The acetabular restrictor was placed into the defect, and position was checked with fluoroscopy.  Cancellous bone chips and DBM were mixed and placed into the native socket.  The graft was impacted with a 58 mm reamer on reverse.  I then placed a 60 mm cup, which unfortunately did not engage the  native bone.  I then placed a 64 mm cup without any further reaming, which achieved adequate bony purchase.  I placed a total of three 6.5 millimeter screws to further augment the initial press-fit fixation.  The real polyliner was opened and impacted into place.   I regained femoral exposure, and placed a trial head ball.  The hip was reduced.  Stability testing was performed, and I determined that the stability was found to be excellent.  At 90 degrees of external rotation and full extension, the hip was stable to an anterior directed force.  The hip was then dislocated, and trial components were removed.  I placed the real 36+12 mm metal head ball.  The hip was reduced.  Repeat stability testing was performed and found to be unchanged.  Leg length, screw position, and offset were assessed with fluoroscopy and found to be excellent.   The wound was then irrigated with Prontosan solution followed by normal saline using pulsatile lavage.  1 g of vancomycin  powder was placed into the joint.  Through a separate stab incision over the distal lateral thigh, a 10 mm flat JP drain was placed into the subcutaneous tissue and sewn in with 3-0 nylon suture.  The wound was closed in layers with #1 strata fix for the fascia, 2-0 Vicryl for the deep fatty layer, 2-0 Monocryl for the deep dermal layer, and skin staples.  Customizable Prevena negative pressure dressing was placed, and suction was hooked up to 75 mmHg.  There was excellent seal without any leak.  The patient was then awakened from anesthesia and taken to the PACU in stable condition.  Sponge, needle, and iinstrument counts were correct at the end of the case x 2.  There were no known complications.

## 2024-04-30 NOTE — Anesthesia Procedure Notes (Signed)
 Procedure Name: Intubation Date/Time: 04/30/2024 11:46 AM  Performed by: Metta Andrea NOVAK, CRNAPre-anesthesia Checklist: Patient identified, Emergency Drugs available, Suction available, Patient being monitored and Timeout performed Patient Re-evaluated:Patient Re-evaluated prior to induction Oxygen Delivery Method: Circle system utilized Preoxygenation: Pre-oxygenation with 100% oxygen Induction Type: IV induction Ventilation: Mask ventilation without difficulty Laryngoscope Size: Mac and 4 Grade View: Grade I Tube type: Oral Tube size: 7.0 mm Number of attempts: 1 Airway Equipment and Method: Stylet Placement Confirmation: ETT inserted through vocal cords under direct vision, positive ETCO2 and breath sounds checked- equal and bilateral Secured at: 21 cm Tube secured with: Tape Dental Injury: Teeth and Oropharynx as per pre-operative assessment

## 2024-04-30 NOTE — Transfer of Care (Signed)
 Immediate Anesthesia Transfer of Care Note  Patient: Heather Gray  Procedure(s) Performed: REVISION, TOTAL ARTHROPLASTY, HIP, ANTERIOR APPROACH (Left: Hip)  Patient Location: PACU  Anesthesia Type:General  Level of Consciousness: awake and alert   Airway & Oxygen Therapy: Patient Spontanous Breathing and Patient connected to nasal cannula oxygen  Post-op Assessment: Report given to RN and Post -op Vital signs reviewed and stable  Post vital signs: Reviewed and stable  Last Vitals:  Vitals Value Taken Time  BP 92/62 04/30/24 17:52  Temp    Pulse 78 04/30/24 17:57  Resp 7 04/30/24 17:57  SpO2 100 % 04/30/24 17:57  Vitals shown include unfiled device data.  Last Pain:  Vitals:   04/30/24 0933  TempSrc:   PainSc: 0-No pain         Complications: No notable events documented.

## 2024-04-30 NOTE — H&P (Signed)
 TOTAL HIP REVISION ADMISSION H&P   Patient is admitted for left revision total hip arthroplasty.   Subjective:   Chief Complaint: left hip pain   HPI: Heather Gray, 73 y.o. 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 The indications for the revision total hip arthroplasty are failed acetabular component.  Onset of symptoms was gradual starting 1 years ago with rapidlly worsening course since that time.  Prior procedures on the left hip include revision arthroplasty.  Patient currently rates pain in the left hip at 10 out of 10 with activity.  There is night pain, worsening of pain with activity and weight bearing, trendelenberg gait, pain that interfers with activities of daily living, pain with passive range of motion, and joint swelling. 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 This condition presents safety issues increasing the risk of falls.  There is no current active infection.       Patient Active Problem List    Diagnosis Date Noted   Primary osteoarthritis of right hip 06/29/2020   Bradycardia     Bipolar I disorder, most recent episode depressed (HCC) 03/04/2014   Anxiety state 03/04/2014        Past Medical History:  Diagnosis Date   Anxiety     Arthritis     Bipolar disorder (HCC)     Bradycardia     Chronic kidney disease      CKD2   Depression     Dysrhythmia      Bradycardia,   Hypertension     Hyperthyroidism     Migraine     Sleep apnea      No CPAP   Thyroid  disease      hyperthyroid             Past Surgical History:  Procedure Laterality Date   ABDOMINAL HYSTERECTOMY       ABLATION SAPHENOUS  VEIN W/ RFA Right     BLADDER SURGERY        Bladder Tack   BUNIONECTOMY Bilateral     HAMMER TOE SURGERY Bilateral     JOINT REPLACEMENT Left 2008    Left Hip   ROTATOR CUFF REPAIR Right     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    Procedure: TOTAL HIP ARTHROPLASTY ANTERIOR APPROACH;  Surgeon: Fidel Rogue, MD;  Location: [facility];  Service: Orthopedics;  Laterality: Right;               Current Outpatient Medications  Medication Sig Dispense Refill Last Dose/Taking   alendronate (FOSAMAX) 70 MG tablet Take 70 mg by mouth every Monday. Take with a full glass of water  on an empty stomach.         ALPRAZolam  (XANAX ) 0.5 MG tablet Take 0.75 mg by mouth at bedtime.         amLODipine (NORVASC) 5 MG tablet Take 5 mg by mouth daily.         Calcium -Magnesium -Vitamin D (CALCIUM  1200+D3 PO) Take 1 tablet by mouth daily.         Eszopiclone  3 MG TABS TAKE 1 TABLET BY MOUTH AT BEDTIME TAKE IMMEDIATELY BEFORE BEDTIME (Patient not taking: Reported on 03/12/2024) 30 tablet 2  hydrochlorothiazide  (HYDRODIURIL ) 25 MG tablet Take 25 mg by mouth daily.         LAMICTAL  200 MG tablet Take 200 mg by mouth every morning.         losartan  (COZAAR ) 100 MG tablet Take 100 mg by mouth daily.         Magnesium  400 MG TABS Take 400 mg by mouth at bedtime.         QUEtiapine  (SEROQUEL ) 300 MG tablet Take 300 mg by mouth at bedtime.         QUEtiapine  (SEROQUEL ) 50 MG tablet Take 50 mg by mouth at bedtime.         rizatriptan (MAXALT) 10 MG tablet Take 10 mg by mouth as needed for migraine. May repeat in 2 hours if needed         topiramate  (TOPAMAX ) 200 MG tablet Take 200 mg by mouth at bedtime.         traMADol (ULTRAM) 50 MG tablet Take 25 mg by mouth every 6 (six) hours as needed for moderate pain (pain score 4-6).            No current facility-administered medications for this visit.      Allergies       Allergies  Allergen Reactions   Tetracyclines & Related Shortness Of Breath and  Palpitations   Sulfa Antibiotics Other (See Comments)      Leg cramping      Social History         Tobacco Use   Smoking status: Former      Current packs/day: 0.00      Average packs/day: 2.0 packs/day for 30.0 years (60.0 ttl pk-yrs)      Types: Cigarettes      Start date: 10/16/1958      Quit date: 10/16/1988      Years since quitting: 35.4   Smokeless tobacco: Never  Substance Use Topics   Alcohol  use: Yes      Alcohol /week: 0.0 standard drinks of alcohol       Comment: Ocassionally every six months or so         Family History  Problem Relation Age of Onset   Alcohol  abuse Mother     Alcohol  abuse Father     Schizophrenia Sister     Alcohol  abuse Sister              Review of Systems  Musculoskeletal:  Positive for arthralgias and gait problem.  All other systems reviewed and are negative.    Objective:   Physical Exam Constitutional:      Appearance: Normal appearance.  HENT:     Head: Normocephalic and atraumatic.     Nose: Nose normal.     Mouth/Throat:     Mouth: Mucous membranes are moist.     Pharynx: Oropharynx is clear.  Eyes:     Conjunctiva/sclera: Conjunctivae normal.  Cardiovascular:     Rate and Rhythm: Normal rate and regular rhythm.     Pulses: Normal pulses.     Heart sounds: Normal heart sounds.  Pulmonary:     Effort: Pulmonary effort is normal.     Breath sounds: Normal breath sounds.  Abdominal:     General: Abdomen is flat.     Palpations: Abdomen is soft.  Genitourinary:    Comments: deferred Musculoskeletal:     Cervical back: Normal range of motion and neck supple.     Comments: Examination of the left hip reveals a  healed posterior incision. No significant trochanteric tenderness to palpation. She has severe pain with manipulation of the hip. She has severe pain with resisted flexion and resisted abduction.   Distally, there is no focal motor or sensory deficit. She has palpable pedal pulses. No significant pedal edema.  No calf tenderness to palpation.  Skin:    General: Skin is warm.     Capillary Refill: Capillary refill takes less than 2 seconds.  Neurological:     General: No focal deficit present.     Mental Status: She is alert and oriented to person, place, and time.  Psychiatric:        Mood and Affect: Mood normal.        Behavior: Behavior normal.        Thought Content: Thought content normal.        Judgment: Judgment normal.       Vital signs in last 24 hours: @VSRANGES @     Labs:     Estimated body mass index is 29.35 kg/m as calculated from the following:   Height as of 03/16/24: 5' 4 (1.626 m).   Weight as of 03/16/24: 77.6 kg.   Imaging Review:   Plain radiographs demonstrate severe degenerative joint disease of the left hip(s). The MRI showed periarticular fluid collection consistent with adverse local tissue reaction.The bone quality appears to be adequate for age and reported activity level.       Assessment/Plan:   Failed acetabular component left hip, left hip(s) with failed previous arthroplasty.   The patient history, physical examination, clinical judgement of the provider and imaging studies are consistent with end stage degenerative joint disease of the left hip(s), previous total hip arthroplasty. Revision total hip arthroplasty is deemed medically necessary. The treatment options including medical management, injection therapy, arthroscopy and arthroplasty were discussed at length. The risks and benefits of total hip arthroplasty were presented and reviewed. The risks due to aseptic loosening, infection, stiffness, dislocation/subluxation,  thromboembolic complications and other imponderables were discussed.  The patient acknowledged the explanation, agreed to proceed with the plan and consent was signed. Patient is being admitted for inpatient treatment for surgery, pain control, PT, OT, prophylactic antibiotics, VTE prophylaxis, progressive ambulation and ADL's and  discharge planning. The patient is planning to be discharged home with HEP after a 2-3 night stay.

## 2024-04-30 NOTE — Anesthesia Preprocedure Evaluation (Addendum)
 Anesthesia Evaluation  Patient identified by MRN, date of birth, ID band Patient awake    Reviewed: Allergy & Precautions, NPO status , Patient's Chart, lab work & pertinent test results  Airway Mallampati: II  TM Distance: >3 FB Neck ROM: Full    Dental  (+) Dental Advisory Given, Edentulous Upper, Partial Lower, Upper Dentures   Pulmonary sleep apnea , former smoker   Pulmonary exam normal breath sounds clear to auscultation       Cardiovascular hypertension, Pt. on medications (-) angina (-) Past MI Normal cardiovascular exam Rhythm:Regular Rate:Normal     Neuro/Psych  Headaches PSYCHIATRIC DISORDERS Anxiety Depression Bipolar Disorder      GI/Hepatic negative GI ROS, Neg liver ROS,,,  Endo/Other  negative endocrine ROS    Renal/GU Renal InsufficiencyRenal disease     Musculoskeletal  (+) Arthritis ,  Failed left total hip arthroplasty   Abdominal   Peds  Hematology  (+) Blood dyscrasia, anemia Plt 263k   Anesthesia Other Findings Day of surgery medications reviewed with the patient.  Reproductive/Obstetrics                              Anesthesia Physical Anesthesia Plan  ASA: 3  Anesthesia Plan: General   Post-op Pain Management: Tylenol  PO (pre-op)* and Toradol  IV (intra-op)*   Induction: Intravenous  PONV Risk Score and Plan: 3 and Dexamethasone  and Ondansetron   Airway Management Planned: Oral ETT  Additional Equipment:   Intra-op Plan:   Post-operative Plan: Extubation in OR  Informed Consent: I have reviewed the patients History and Physical, chart, labs and discussed the procedure including the risks, benefits and alternatives for the proposed anesthesia with the patient or authorized representative who has indicated his/her understanding and acceptance.     Dental advisory given  Plan Discussed with: CRNA  Anesthesia Plan Comments:          Anesthesia  Quick Evaluation

## 2024-04-30 NOTE — Discharge Instructions (Addendum)
 Dr. Redell Shoals Joint Replacement Specialist [facility] [address] [place], [place] [address] [phone]   TOTAL HIP REPLACEMENT POSTOPERATIVE DIRECTIONS    Hip Rehabilitation, Guidelines Following Surgery   WEIGHT BEARING Other:  Non-weightbearing left lower extremity with walker/wheelchair.   The results of a hip operation are greatly improved after range of motion and muscle strengthening exercises. Follow all safety measures which are given to protect your hip. If any of these exercises cause increased pain or swelling in your joint, decrease the amount until you are comfortable again. Then slowly increase the exercises. Call your caregiver if you have problems or questions.   HOME CARE INSTRUCTIONS  Most of the following instructions are designed to prevent the dislocation of your new hip.  Remove items at home which could result in a fall. This includes throw rugs or furniture in walking pathways.  Continue medications as instructed at time of discharge. You may have some home medications which will be placed on hold until you complete the course of blood thinner medication. Keep dressing clean and dry.  Do not remove your dressing. Do not put on socks or shoes without following the instructions of your caregivers.   Sit on chairs with arms. Use the chair arms to help push yourself up when arising.  Arrange for the use of a toilet seat elevator so you are not sitting low.  Walk with walker as instructed.  Non-weightbearing left lower extremity.  You may resume a sexual relationship in one month or when given the OK by your caregiver.  Use walker as long as suggested by your caregivers.  Avoid periods of inactivity such as sitting longer than an hour when not asleep. This helps prevent blood clots.  You may return to work once you are cleared by Designer, industrial/product.  Do not drive a car for 6 weeks or until released by your surgeon.  Do not drive  while taking narcotics.  Wear elastic stockings for two weeks following surgery during the day but you may remove then at night.  Make sure you keep all of your appointments after your operation with all of your doctors and caregivers. You should call the office at the above phone number and make an appointment for approximately two weeks after the date of your surgery. Please pick up a stool softener and laxative for home use as long as you are requiring pain medications. ICE to the affected hip every three hours for 30 minutes at a time and then as needed for pain and swelling. Continue to use ice on the hip for pain and swelling from surgery. You may notice swelling that will progress down to the foot and ankle.  This is normal after surgery.  Elevate the leg when you are not up walking on it.   It is important for you to complete the blood thinner medication as prescribed by your doctor. Continue to use the breathing machine which will help keep your temperature down.  It is common for your temperature to cycle up and down following surgery, especially at night when you are not up moving around and exerting yourself.  The breathing machine keeps your lungs expanded and your temperature down.  RANGE OF MOTION AND STRENGTHENING EXERCISES  These exercises are designed to help you keep full movement of your hip joint. Follow your caregiver's or physical therapist's instructions. Perform all exercises about fifteen times, three times per day or as directed. Exercise both hips, even if  you have had only one joint replacement. These exercises can be done on a training (exercise) mat, on the floor, on a table or on a bed. Use whatever works the best and is most comfortable for you. Use music or television while you are exercising so that the exercises are a pleasant break in your day. This will make your life better with the exercises acting as a break in routine you can look forward to.  Lying on your back,  slowly slide your foot toward your buttocks, raising your knee up off the floor. Then slowly slide your foot back down until your leg is straight again.  Lying on your back spread your legs as far apart as you can without causing discomfort.  Lying on your side, raise your upper leg and foot straight up from the floor as far as is comfortable. Slowly lower the leg and repeat.  Lying on your back, tighten up the muscle in the front of your thigh (quadriceps muscles). You can do this by keeping your leg straight and trying to raise your heel off the floor. This helps strengthen the largest muscle supporting your knee.  Lying on your back, tighten up the muscles of your buttocks both with the legs straight and with the knee bent at a comfortable angle while keeping your heel on the floor.   SKILLED REHAB INSTRUCTIONS: If the patient is transferred to a skilled rehab facility following release from the hospital, a list of the current medications will be sent to the facility for the patient to continue.  When discharged from the skilled rehab facility, please have the facility set up the patient's Home Health Physical Therapy prior to being released. Also, the skilled facility will be responsible for providing the patient with their medications at time of release from the facility to include their pain medication and their blood thinner medication. If the patient is still at the rehab facility at time of the two week follow up appointment, the skilled rehab facility will also need to assist the patient in arranging follow up appointment in our office and any transportation needs.  POST-OPERATIVE OPIOID TAPER INSTRUCTIONS: It is important to wean off of your opioid medication as soon as possible. If you do not need pain medication after your surgery it is ok to stop day one. Opioids include: Codeine, Hydrocodone (Norco, Vicodin), Oxycodone (Percocet, oxycontin ) and hydromorphone  amongst others.  Long term and  even short term use of opiods can cause: Increased pain response Dependence Constipation Depression Respiratory depression And more.  Withdrawal symptoms can include Flu like symptoms Nausea, vomiting And more Techniques to manage these symptoms Hydrate well Eat regular healthy meals Stay active Use relaxation techniques(deep breathing, meditating, yoga) Do Not substitute Alcohol  to help with tapering If you have been on opioids for less than two weeks and do not have pain than it is ok to stop all together.  Plan to wean off of opioids This plan should start within one week post op of your joint replacement. Maintain the same interval or time between taking each dose and first decrease the dose.  Cut the total daily intake of opioids by one tablet each day Next start to increase the time between doses. The last dose that should be eliminated is the evening dose.    MAKE SURE YOU:  Understand these instructions.  Will watch your condition.  Will get help right away if you are not doing well or get worse.  Pick up stool softner and  laxative for home use following surgery while on pain medications. Do not remove your dressing. Keep dressing clean and dry.  Do not shower with dressing.  Continue to use ice for pain and swelling after surgery. Do not use any lotions or creams on the incision until instructed by your surgeon. Posterior hip precautions.  Non-weightbearing on left lower extremity. Please empty your JP drain every 4 hours and record the output. Please keep a log of how much output you have from your drain and bring to your appointment in the office.  Please charge your Prevena negative pressure incisional dressing nightly.

## 2024-05-01 ENCOUNTER — Encounter (HOSPITAL_COMMUNITY): Payer: Self-pay | Admitting: Orthopedic Surgery

## 2024-05-01 ENCOUNTER — Other Ambulatory Visit: Payer: Self-pay

## 2024-05-01 LAB — CBC
HCT: 26.8 % — ABNORMAL LOW (ref 36.0–46.0)
Hemoglobin: 8.8 g/dL — ABNORMAL LOW (ref 12.0–15.0)
MCH: 30 pg (ref 26.0–34.0)
MCHC: 32.8 g/dL (ref 30.0–36.0)
MCV: 91.5 fL (ref 80.0–100.0)
Platelets: 149 K/uL — ABNORMAL LOW (ref 150–400)
RBC: 2.93 MIL/uL — ABNORMAL LOW (ref 3.87–5.11)
RDW: 16.6 % — ABNORMAL HIGH (ref 11.5–15.5)
WBC: 8.3 K/uL (ref 4.0–10.5)
nRBC: 0 % (ref 0.0–0.2)

## 2024-05-01 LAB — BASIC METABOLIC PANEL WITH GFR
Anion gap: 11 (ref 5–15)
BUN: 22 mg/dL (ref 8–23)
CO2: 19 mmol/L — ABNORMAL LOW (ref 22–32)
Calcium: 8 mg/dL — ABNORMAL LOW (ref 8.9–10.3)
Chloride: 110 mmol/L (ref 98–111)
Creatinine, Ser: 0.92 mg/dL (ref 0.44–1.00)
GFR, Estimated: >60 mL/min (ref >60)
Glucose, Bld: 114 mg/dL — ABNORMAL HIGH (ref 70–99)
Potassium: 4.5 mmol/L (ref 3.5–5.1)
Sodium: 139 mmol/L (ref 135–145)

## 2024-05-01 MED ORDER — ORAL CARE MOUTH RINSE
15.0000 mL | OROMUCOSAL | Status: DC | PRN
Start: 2024-05-01 — End: 2024-05-08

## 2024-05-01 NOTE — Plan of Care (Signed)
   Problem: Coping: Goal: Level of anxiety will decrease Outcome: Progressing   Problem: Pain Managment: Goal: General experience of comfort will improve and/or be controlled Outcome: Progressing   Problem: Safety: Goal: Ability to remain free from injury will improve Outcome: Progressing

## 2024-05-01 NOTE — Progress Notes (Signed)
    Subjective:  Patient reports pain as moderate.  Denies N/V/CP/SOB/Abd pain. Denies tingling or numbness in LE bilaterally.  Appropriate PO swelling, swelling on left knee today. Knee pain and thigh pain.  Discussed dressings and drains today.  Discussed weightbearing restrictions this am.    Objective:   VITALS:   Vitals:   04/30/24 2044 04/30/24 2337 05/01/24 0318 05/01/24 0607  BP: 127/79 119/70 (!) 93/54 (!) 101/53  Pulse: 76 62 66   Resp: 18 18 18 16   Temp: 98.5 F (36.9 C) 98 F (36.7 C) 98.2 F (36.8 C) 98.7 F (37.1 C)  TempSrc: Oral Oral Oral Oral  SpO2: 100% 100% 99% 98%  Weight:  77.6 kg    Height:  5' 5 (1.651 m)      NAD Neurologically intact ABD soft Neurovascular intact Sensation intact distally Intact pulses distally Dorsiflexion/Plantar flexion intact No cellulitis present Compartment soft Prevena negative pressure incisional dressing C/D/I. no leaks detected.  JP drain dressing C/D/I. 90cc fluid.  Moderate swelling left knee, continue to monitor.   Lab Results  Component Value Date   WBC 8.3 05/01/2024   HGB 8.8 (L) 05/01/2024   HCT 26.8 (L) 05/01/2024   MCV 91.5 05/01/2024   PLT 149 (L) 05/01/2024   BMET    Component Value Date/Time   NA 139 05/01/2024 0315   K 4.5 05/01/2024 0315   CL 110 05/01/2024 0315   CO2 19 (L) 05/01/2024 0315   GLUCOSE 114 (H) 05/01/2024 0315   BUN 22 05/01/2024 0315   CREATININE 0.92 05/01/2024 0315   CALCIUM  8.0 (L) 05/01/2024 0315   GFRNONAA >60 05/01/2024 0315     Assessment/Plan: 1 Day Post-Op   Principal Problem:   S/p revision of left total hip  ABLA. Hemoglobin 8.8 this morning. 4 units PRBCs yesterday. Continue to monitor.   NWB LLE with walker and posterior hip precautions.  DVT ppx: Aspirin , SCDs, TEDS PO pain control PT/OT: To come today.  - Patient would like to be discharged home.  - Maintain negative pressure dressing at 75 mmHg.  - We will discontinue the JP drain when  output is less than 10 cc per shift. - Mobilize out of bed with PT/OT.  - Upon discharge, the house VAC suction unit will be exchanged for a portable Prevena suction unit. She will need to follow-up in the office within 7 days of discharge for removal of her negative pressure dressing.   Heather Gray Potters 05/01/2024, 8:48 AM   [facility]  [place] [address], [place], [place] [address] Phone: [phone] [email or web address] Facebook  [employer]

## 2024-05-01 NOTE — TOC Initial Note (Signed)
 Transition of Care [facility]) - Initial/Assessment Note    Patient Details  Name: Heather Gray MRN: [id number] Date of Birth: Nov 21, 1950  Transition of Care [facility]) CM/SW Contact:    NORMAN ASPEN, LCSW Phone Number: 05/01/2024, 2:38 PM  Clinical Narrative:                 Met with pt today to review anticipated dc planning needs.  Pt familiar to me from prior surgery in August.  Pt reports that she is very hopeful that she will be able to dc home after this hospitalization and has made arrangements for assistance. Pt reports that she has 3 CNAs lined up and aware will likely need to begin with 24/7 coverage.  She is interested in having some HHPT as well.  She has all needed DME including a scooter she can use in her one-level home.  Pt aware that TOC will continue to follow and assist with any dc referrals.  Will continue to work with therapy to address mobility and ADLs needs.  Expected Discharge Plan: Home w Home Health Services Barriers to Discharge: Continued Medical Work up   Patient Goals and CMS Choice Patient states their goals for this hospitalization and ongoing recovery are:: return home          Expected Discharge Plan and Services In-house Referral: Clinical Social Work     Living arrangements for the past 2 months: Single Family Home                 DME Arranged: N/A DME Agency: NA                  Prior Living Arrangements/Services Living arrangements for the past 2 months: Single Family Home Lives with:: Self Patient language and need for interpreter reviewed:: Yes Do you feel safe going back to the place where you live?: Yes      Need for Family Participation in Patient Care: Yes (Comment) Care giver support system in place?: Yes (comment)   Criminal Activity/Legal Involvement Pertinent to Current Situation/Hospitalization: No - Comment as needed  Activities of Daily Living   ADL Screening (condition at time of admission) Independently performs ADLs?: Yes  (appropriate for developmental age) Is the patient deaf or have difficulty hearing?: No Does the patient have difficulty seeing, even when wearing glasses/contacts?: No Does the patient have difficulty concentrating, remembering, or making decisions?: No  Permission Sought/Granted Permission sought to share information with : Family Supports Permission granted to share information with : Yes, Verbal Permission Granted  Share Information with NAME: friend, Nena Moats @ [phone]           Emotional Assessment Appearance:: Appears stated age Attitude/Demeanor/Rapport: Engaged Affect (typically observed): Accepting Orientation: : Oriented to Self, Oriented to Place, Oriented to  Time, Oriented to Situation Alcohol  / Substance Use: Not Applicable Psych Involvement: No (comment)  Admission diagnosis:  Presence of left artificial hip joint [Z96.642] 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 [place] [address] Phone: [phone] Fax: [phone]     Social Drivers of Health (SDOH) Social History: SDOH Screenings   Food Insecurity: No Food Insecurity (05/01/2024)  Housing: Low Risk  (05/01/2024)  Transportation Needs: No Transportation Needs (05/01/2024)  Utilities: Not At Risk (05/01/2024)  Financial Resource Strain: Low Risk  (05/07/2022)   Received from [facility] visits prior to 10/27/2022., [facility]  Physical Activity: Sufficiently Active  (05/07/2022)   [facility] from [facility] visits prior to 10/27/2022.  Social Connections: Unknown (05/01/2024)  Stress: Stress Concern Present (05/07/2022)   [facility] from [facility] visits prior to 10/27/2022., [facility]  Tobacco Use: Medium Risk (04/30/2024)   SDOH Interventions:     Readmission Risk Interventions    05/01/2024    2:34 PM 03/27/2024   11:03 AM  Readmission Risk Prevention Plan  Post Dischage Appt Complete Complete  Medication Screening Complete Complete  Transportation Screening Complete Complete

## 2024-05-01 NOTE — Evaluation (Signed)
 Physical Therapy Evaluation Patient Details Name: Heather Gray MRN: [id number] DOB: June 12, 1951 Today's Date: 05/01/2024  History of Present Illness  73 y.o. 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 Pt noted to have sacral insufficiency fractures.  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  Clinical Impression  Pt admitted with above diagnosis. Mod assist for supine to sit, min assist for sit to stand from elevated bed, pt pivoted on RLE with RW from bed to recliner with good adherence to NWB status LLE. Pt puts forth good effort. Pt hopes to DC home with help from friends and private caregivers as she had a poor experience at rehab in August.  Pt currently with functional limitations due to the deficits listed below (see PT Problem List). Pt will benefit from acute skilled PT to increase their independence and safety with mobility to allow discharge.           If plan is discharge home, recommend the following: A little help with walking and/or transfers;A lot of help with bathing/dressing/bathroom;Assistance with cooking/housework;Assist for transportation;Help with stairs or ramp for entrance   Can travel by private vehicle        Equipment Recommendations None recommended by PT  Recommendations for Other Services       Functional Status Assessment Patient has had a recent decline in their functional status and demonstrates the ability to make significant improvements in function in a reasonable and predictable amount of time.     Precautions / Restrictions Precautions Precautions: Fall;Posterior Hip; wound VAC, JP drain L hip Precaution Booklet Issued: Yes (comment) Recall of Precautions/Restrictions: Intact Precaution/Restrictions Comments: reviewed posterior precautions Restrictions Weight Bearing Restrictions Per Provider Order: Yes LLE Weight Bearing Per Provider Order: Non weight bearing      Mobility  Bed  Mobility Overal bed mobility: Needs Assistance Bed Mobility: Supine to Sit     Supine to sit: Mod assist, HOB elevated, Used rails     General bed mobility comments: assist to pivot hips to EOB and support LLE (used gait belt as leg lifter)    Transfers Overall transfer level: Needs assistance Equipment used: Rolling walker (2 wheels) Transfers: Sit to/from Stand, Bed to chair/wheelchair/BSC Sit to Stand: Min assist, From elevated surface Stand pivot transfers: Contact guard assist         General transfer comment: good adherence to THP and LLE NWB status, pivoted on RLE bed to recliner with RW    Ambulation/Gait                  Stairs            Wheelchair Mobility     Tilt Bed    Modified Rankin (Stroke Patients Only)       Balance Overall balance assessment: Needs assistance Sitting-balance support: Feet supported, No upper extremity supported Sitting balance-Leahy Scale: Good     Standing balance support: Bilateral upper extremity supported Standing balance-Leahy Scale: Poor Standing balance comment: reliant on RW                             Pertinent Vitals/Pain Pain Assessment Pain Score: 6  Pain Location: L hip and L knee Pain Descriptors / Indicators: Sore, Operative site guarding Pain Intervention(s): Limited activity within patient's tolerance, Monitored during session, Premedicated before session, Ice applied, Repositioned    Home Living Family/patient expects to be discharged to:: Private residence  Living Arrangements: Alone Available Help at Discharge: Friend(s);Personal care attendant;Available 24 hours/day (per pt only for a day or two) Type of Home: House Home Access: Level entry       Home Layout: One level Home Equipment: Agricultural consultant (2 wheels);Cane - single point;BSC/3in1;Wheelchair - power      Prior Function Prior Level of Function : Independent/Modified Independent             Mobility  Comments: was walking with a RW and using motorized scooter PTA, had gone to [place] place after recent hip revision in August then [facility] home       Extremity/Trunk Assessment   Upper Extremity Assessment Upper Extremity Assessment: Overall WFL for tasks assessed    Lower Extremity Assessment Lower Extremity Assessment: LLE deficits/detail LLE Deficits / Details: hip flexion AAROM to ~35* limited by pain LLE Sensation: WNL LLE Coordination: WNL    Cervical / Trunk Assessment Cervical / Trunk Assessment: Normal  Communication   Communication Communication: No apparent difficulties    Cognition Arousal: Alert Behavior During Therapy: WFL for tasks assessed/performed   PT - Cognitive impairments: No apparent impairments                         Following commands: Intact       Cueing Cueing Techniques: Verbal cues     General Comments      Exercises Total Joint Exercises Ankle Circles/Pumps: AROM, Both, 10 reps, Supine Heel Slides: AAROM, Left, 5 reps, Supine   Assessment/Plan    PT Assessment Patient needs continued PT services  PT Problem List Decreased range of motion;Decreased strength;Decreased mobility;Decreased activity tolerance;Pain       PT Treatment Interventions DME instruction;Gait training;Functional mobility training;Therapeutic activities;Therapeutic exercise;Balance training;Patient/family education    PT Goals (Current goals can be found in the Care Plan section)  Acute Rehab PT Goals Patient Stated Goal: hopes to DC home PT Goal Formulation: With patient Time For Goal Achievement: 05/15/24 Potential to Achieve Goals: Good    Frequency Min 6X/week     Co-evaluation               AM-PAC PT 6 Clicks Mobility  Outcome Measure Help needed turning from your back to your side while in a flat bed without using bedrails?: A Little Help needed moving from lying on your back to sitting on the side of a flat bed without using  bedrails?: A Lot Help needed moving to and from a bed to a chair (including a wheelchair)?: A Little Help needed standing up from a chair using your arms (e.g., wheelchair or bedside chair)?: A Little Help needed to walk in hospital room?: Total Help needed climbing 3-5 steps with a railing? : Total 6 Click Score: 13    End of Session Equipment Utilized During Treatment: Gait belt Activity Tolerance: Patient tolerated treatment well Patient left: in chair;with call bell/phone within reach;with chair alarm set Nurse Communication: Mobility status PT Visit Diagnosis: Difficulty in walking, not elsewhere classified (R26.2);Pain Pain - Right/Left: Left Pain - part of body: Hip    Time: 8950-8883 PT Time Calculation (min) (ACUTE ONLY): 27 min   Charges:   PT Evaluation $PT Eval Moderate Complexity: 1 Mod PT Treatments $Therapeutic Activity: 8-22 mins PT General Charges $$ ACUTE PT VISIT: 1 Visit         Sylvan Delon Copp PT 05/01/2024  Acute Rehabilitation Services  Office [phone]

## 2024-05-02 LAB — BASIC METABOLIC PANEL WITH GFR
Anion gap: 9 (ref 5–15)
BUN: 27 mg/dL — ABNORMAL HIGH (ref 8–23)
CO2: 20 mmol/L — ABNORMAL LOW (ref 22–32)
Calcium: 8 mg/dL — ABNORMAL LOW (ref 8.9–10.3)
Chloride: 108 mmol/L (ref 98–111)
Creatinine, Ser: 1.06 mg/dL — ABNORMAL HIGH (ref 0.44–1.00)
GFR, Estimated: 56 mL/min — ABNORMAL LOW (ref >60)
Glucose, Bld: 100 mg/dL — ABNORMAL HIGH (ref 70–99)
Potassium: 4.2 mmol/L (ref 3.5–5.1)
Sodium: 137 mmol/L (ref 135–145)

## 2024-05-02 LAB — CBC
HCT: 23 % — ABNORMAL LOW (ref 36.0–46.0)
Hemoglobin: 7.2 g/dL — ABNORMAL LOW (ref 12.0–15.0)
MCH: 29.6 pg (ref 26.0–34.0)
MCHC: 31.3 g/dL (ref 30.0–36.0)
MCV: 94.7 fL (ref 80.0–100.0)
Platelets: 122 K/uL — ABNORMAL LOW (ref 150–400)
RBC: 2.43 MIL/uL — ABNORMAL LOW (ref 3.87–5.11)
RDW: 16.6 % — ABNORMAL HIGH (ref 11.5–15.5)
WBC: 6.4 K/uL (ref 4.0–10.5)
nRBC: 0 % (ref 0.0–0.2)

## 2024-05-02 LAB — PREPARE RBC (CROSSMATCH)

## 2024-05-02 MED ORDER — SODIUM CHLORIDE 0.9% IV SOLUTION: Freq: Once | INTRAVENOUS | Status: AC

## 2024-05-02 NOTE — Progress Notes (Signed)
 Provider notified via telephone to report patient's hemoglobin lab value.  Patient's latest hemoglobin lab result is 7.2 -downward trend. Provider to enter PRBC order for patient.  Awaiting orders.  Will continue to monitor patient. Patient currently asymptomatic.  Alert and oriented x4.

## 2024-05-02 NOTE — Plan of Care (Signed)
  Problem: Health Behavior/Discharge Planning: Goal: Ability to manage health-related needs will improve Outcome: Progressing   Problem: Clinical Measurements: Goal: Respiratory complications will improve Outcome: Progressing   Problem: Nutrition: Goal: Adequate nutrition will be maintained Outcome: Progressing   Problem: Coping: Goal: Level of anxiety will decrease Outcome: Progressing   Problem: Safety: Goal: Ability to remain free from injury will improve Outcome: Progressing   Problem: Education: Goal: Knowledge of the prescribed therapeutic regimen will improve Outcome: Progressing Goal: Understanding of discharge needs will improve Outcome: Progressing   Problem: Skin Integrity: Goal: Will show signs of wound healing Outcome: Progressing

## 2024-05-02 NOTE — Progress Notes (Signed)
 Orthopedic Tech Progress Note Patient Details:  [name] 1950-10-10 [id number]  Patient ID: Heather Gray, female   DOB: 1951/01/11, 73 y.o.   MRN: [id number] No OHF, Age restricted Heather Gray 05/02/2024, 9:25 AM

## 2024-05-02 NOTE — Progress Notes (Signed)
 Physical Therapy Treatment Patient Details Name: Heather Gray MRN: [id number] DOB: Feb 02, 1951 Today's Date: 05/02/2024   History of Present Illness 73 y.o. 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 Pt noted to have sacral insufficiency fractures.  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    PT Comments  Pt is progressing with mobility, she was able to take 5 small hops forwards with RW with good adherence to NWB status LLE. Pt can correctly recall 3/3 posterior hip precautions and adheres to them well. Reviewed THA HEP. She is hoping to DC home with private caregivers.     If plan is discharge home, recommend the following: A little help with walking and/or transfers;A lot of help with bathing/dressing/bathroom;Assistance with cooking/housework;Assist for transportation;Help with stairs or ramp for entrance   Can travel by private vehicle        Equipment Recommendations  None recommended by PT    Recommendations for Other Services       Precautions / Restrictions Precautions Precautions: Fall;Posterior Hip Precaution Booklet Issued: Yes (comment) Recall of Precautions/Restrictions: Intact Precaution/Restrictions Comments: pt able to state 3/3 posterior hip precautions Restrictions Weight Bearing Restrictions Per Provider Order: Yes LLE Weight Bearing Per Provider Order: Non weight bearing     Mobility  Bed Mobility Overal bed mobility: Modified Independent Bed Mobility: Supine to Sit     Supine to sit: Used rails, HOB elevated, Modified independent (Device/Increase time)     General bed mobility comments: used gait belt as LLE lifter    Transfers Overall transfer level: Needs assistance Equipment used: Rolling walker (2 wheels) Transfers: Sit to/from Stand Sit to Stand: Contact guard assist           General transfer comment: good hand placement, good adherence to THP and LLE NWB status     Ambulation/Gait Ambulation/Gait assistance: Contact guard assist Gait Distance (Feet): 3 Feet Assistive device: Rolling walker (2 wheels) Gait Pattern/deviations: Step-to pattern, Decreased step length - right       General Gait Details: good adherence to NWB status, pt took ~5 small hops forwards, no loss of balance, distance limited by pain/fatigue   Stairs             Wheelchair Mobility     Tilt Bed    Modified Rankin (Stroke Patients Only)       Balance Overall balance assessment: Needs assistance Sitting-balance support: Feet supported, No upper extremity supported Sitting balance-Leahy Scale: Good     Standing balance support: Bilateral upper extremity supported, Reliant on assistive device for balance, During functional activity Standing balance-Leahy Scale: Poor Standing balance comment: reliant on RW                            Communication Communication Communication: No apparent difficulties  Cognition Arousal: Alert Behavior During Therapy: WFL for tasks assessed/performed   PT - Cognitive impairments: No apparent impairments                         Following commands: Intact      Cueing Cueing Techniques: Verbal cues  Exercises Total Joint Exercises Ankle Circles/Pumps: AROM, Both, 15 reps, Supine Quad Sets: AROM, Both, Supine, 5 reps Heel Slides: AAROM, Left, Supine, 10 reps Hip ABduction/ADduction: AAROM, Left, 10 reps, Supine Long Arc Quad: AROM, Left, 10 reps, Seated    General Comments  Pertinent Vitals/Pain Pain Assessment Pain Score: 6  Pain Location: L hip Pain Descriptors / Indicators: Sore, Operative site guarding Pain Intervention(s): Limited activity within patient's tolerance, Monitored during session, Premedicated before session, Repositioned, Ice applied    Home Living                          Prior Function            PT Goals (current goals can now be found in the  care plan section) Acute Rehab PT Goals Patient Stated Goal: hopes to DC home PT Goal Formulation: With patient Time For Goal Achievement: 05/15/24 Potential to Achieve Goals: Good Progress towards PT goals: Progressing toward goals    Frequency    Min 6X/week      PT Plan      Co-evaluation              AM-PAC PT 6 Clicks Mobility   Outcome Measure  Help needed turning from your back to your side while in a flat bed without using bedrails?: A Little Help needed moving from lying on your back to sitting on the side of a flat bed without using bedrails?: A Little Help needed moving to and from a bed to a chair (including a wheelchair)?: A Little Help needed standing up from a chair using your arms (e.g., wheelchair or bedside chair)?: A Little Help needed to walk in hospital room?: A Lot Help needed climbing 3-5 steps with a railing? : Total 6 Click Score: 15    End of Session Equipment Utilized During Treatment: Gait belt Activity Tolerance: Patient tolerated treatment well Patient left: in chair;with call bell/phone within reach;with chair alarm set;with nursing/sitter in room Nurse Communication: Mobility status PT Visit Diagnosis: Difficulty in walking, not elsewhere classified (R26.2);Pain Pain - Right/Left: Left Pain - part of body: Hip     Time: 9058-8995 PT Time Calculation (min) (ACUTE ONLY): 23 min  Charges:    $Gait Training: 8-22 mins $Therapeutic Exercise: 8-22 mins PT General Charges $$ ACUTE PT VISIT: 1 Visit                    Sylvan Delon Copp PT 05/02/2024  Acute Rehabilitation Services  Office [phone]

## 2024-05-02 NOTE — Progress Notes (Signed)
 Subjective: 2 Days Post-Op Procedure(s) (LRB): REVISION, TOTAL ARTHROPLASTY, HIP, ANTERIOR APPROACH (Left)  Patient reports pain as mild to moderate.  Denies fever, chills, N/V, CP, SOB.  Tolerating POs well.  Admits to flatus.  Objective:   VITALS:  Temp:  [97.9 F (36.6 C)-98.5 F (36.9 C)] 98.2 F (36.8 C) (09/06 0622) Pulse Rate:  [61-79] 68 (09/06 0622) Resp:  [18] 18 (09/06 0622) BP: (90-136)/(51-68) 90/51 (09/06 0622) SpO2:  [98 %-100 %] 100 % (09/06 0622)  General: WDWN patient in NAD. Psych:  Appropriate mood and affect. Neuro:  A&O x 3, Moving all extremities, sensation intact to light touch HEENT:  EOMs intact Chest:  Even non-labored respirations Skin:  Dressing C/D/I, no rashes or lesions.  Small amount of serosanguinous drainage in JP drain. Extremities: warm/dry, mild edema to L hip, no erythema or echymosis.  No lymphadenopathy. Pulses: Popliteus 2+ MSK:  ROM: TKE, MMT: able to perform quad set, (-) Homan's    LABS Recent Labs    04/30/24 1313 04/30/24 1456 04/30/24 1602 04/30/24 1903 05/01/24 0315  HGB 7.5* 8.5* 6.5* 10.5* 8.8*  WBC  --   --   --  10.6* 8.3  PLT  --   --   --  151 149*   Recent Labs    05/01/24 0315 05/02/24 0343  NA 139 137  K 4.5 4.2  CL 110 108  CO2 19* 20*  BUN 22 27*  CREATININE 0.92 1.06*  GLUCOSE 114* 100*   No results for input(s): LABPT, INR in the last 72 hours.   Assessment/Plan: 2 Days Post-Op Procedure(s) (LRB): REVISION, TOTAL ARTHROPLASTY, HIP, ANTERIOR APPROACH (Left)  Patient seen in rounds for Dr. Fidel NWB L LE JP drain left in place.  20 cc of output most recent shift.  D/c drain when less than 10 cc per shift. Prevena at 75 mmHg Up with therapy.  Eva Barrack PA-C [facility] Office:  [phone]

## 2024-05-03 LAB — CBC
HCT: 29.1 % — ABNORMAL LOW (ref 36.0–46.0)
Hemoglobin: 9.1 g/dL — ABNORMAL LOW (ref 12.0–15.0)
MCH: 28.7 pg (ref 26.0–34.0)
MCHC: 31.3 g/dL (ref 30.0–36.0)
MCV: 91.8 fL (ref 80.0–100.0)
Platelets: 136 K/uL — ABNORMAL LOW (ref 150–400)
RBC: 3.17 MIL/uL — ABNORMAL LOW (ref 3.87–5.11)
RDW: 16.8 % — ABNORMAL HIGH (ref 11.5–15.5)
WBC: 6.9 K/uL (ref 4.0–10.5)
nRBC: 0 % (ref 0.0–0.2)

## 2024-05-03 LAB — BASIC METABOLIC PANEL WITH GFR
Anion gap: 9 (ref 5–15)
BUN: 21 mg/dL (ref 8–23)
CO2: 21 mmol/L — ABNORMAL LOW (ref 22–32)
Calcium: 8.2 mg/dL — ABNORMAL LOW (ref 8.9–10.3)
Chloride: 109 mmol/L (ref 98–111)
Creatinine, Ser: 0.82 mg/dL (ref 0.44–1.00)
GFR, Estimated: >60 mL/min (ref >60)
Glucose, Bld: 98 mg/dL (ref 70–99)
Potassium: 3.8 mmol/L (ref 3.5–5.1)
Sodium: 139 mmol/L (ref 135–145)

## 2024-05-03 NOTE — Progress Notes (Signed)
 Patient ID: Heather Gray, female   DOB: 21-Mar-1951, 73 y.o.   MRN: [id number] Subjective: 3 Days Post-Op Procedure(s) (LRB): REVISION, TOTAL ARTHROPLASTY, HIP, ANTERIOR APPROACH (Left)    Patient reports pain as moderate. States she has received 6 units of PRBCs at this point Limited activity based on receiving blood as well as post restrictions  Objective:   VITALS:   Vitals:   05/03/24 0030 05/03/24 0542  BP: (!) 96/54 105/65  Pulse: (!) 58 (!) 59  Resp: 16 14  Temp: 97.7 F (36.5 C) 98.1 F (36.7 C)  SpO2:  99%    Neurovascular intact Incision: dressing C/D/I  LABS Recent Labs    05/01/24 0315 05/02/24 0833 05/03/24 0327  HGB 8.8* 7.2* 9.1*  HCT 26.8* 23.0* 29.1*  WBC 8.3 6.4 6.9  PLT 149* 122* 136*    Recent Labs    05/01/24 0315 05/02/24 0343 05/03/24 0327  NA 139 137 139  K 4.5 4.2 3.8  BUN 22 27* 21  CREATININE 0.92 1.06* 0.82  GLUCOSE 114* 100* 98    No results for input(s): LABPT, INR in the last 72 hours.   Assessment/Plan: 3 Days Post-Op Procedure(s) (LRB): REVISION, TOTAL ARTHROPLASTY, HIP, ANTERIOR APPROACH (Left)   Advance diet Up with therapy Work with PT if possible Hgb stable at 9.2 today after PRBCs yesterday NWB LLE per [place] Disposition dependent on PT progress

## 2024-05-03 NOTE — Progress Notes (Signed)
 Physical Therapy Treatment Patient Details Name: Heather Gray MRN: [id number] DOB: 05/05/1951 Today's Date: 05/03/2024   History of Present Illness 73 y.o. 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 Pt noted to have sacral insufficiency fractures.  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    PT Comments  Pt agreeable to working with therapy. Moderate pain with activity (3/10 at rest). Pt is progressing well. Good adherence to NWB status and good recall of hip precautions. Will continue to follow pt during hospital stay. Plan is for HHPT after discharge.     If plan is discharge home, recommend the following: A little help with walking and/or transfers;A lot of help with bathing/dressing/bathroom;Assistance with cooking/housework;Assist for transportation;Help with stairs or ramp for entrance   Can travel by private vehicle        Equipment Recommendations  None recommended by PT    Recommendations for Other Services       Precautions / Restrictions Precautions Precautions: Fall;Posterior Hip Recall of Precautions/Restrictions: Intact Precaution/Restrictions Comments: pt able to state 3/3 posterior hip precautions Restrictions Weight Bearing Restrictions Per Provider Order: Yes LLE Weight Bearing Per Provider Order: Non weight bearing     Mobility  Bed Mobility Overal bed mobility: Needs Assistance Bed Mobility: Supine to Sit     Supine to sit: Used rails, HOB elevated, Modified independent (Device/Increase time)     General bed mobility comments: used gait belt as LLE lifter. Increased time. Good adherence to precautions.    Transfers Overall transfer level: Needs assistance Equipment used: Rolling walker (2 wheels) Transfers: Sit to/from Stand Sit to Stand: Contact guard assist          General transfer comment: good hand placement, good adherence to THP and LLE NWB status     Ambulation/Gait Ambulation/Gait assistance: Contact guard assist Gait Distance (Feet): 6 Feet Assistive device: Rolling walker (2 wheels) Gait Pattern/deviations: Step-to pattern       General Gait Details: good adherence to NWB status. distance limited by pain/fatigue. followed with recliner and transported back to bedside   Stairs             Wheelchair Mobility     Tilt Bed    Modified Rankin (Stroke Patients Only)       Balance Overall balance assessment: Needs assistance         Standing balance support: Bilateral upper extremity supported, Reliant on assistive device for balance, During functional activity Standing balance-Leahy Scale: Poor                              Communication Communication Communication: No apparent difficulties  Cognition Arousal: Alert Behavior During Therapy: WFL for tasks assessed/performed   PT - Cognitive impairments: No apparent impairments                         Following commands: Intact      Cueing Cueing Techniques: Verbal cues  Exercises Total Joint Exercises Ankle Circles/Pumps: AROM, Both, 10 reps Quad Sets: AROM, Both, 10 reps Heel Slides: AAROM, Left, 10 reps Hip ABduction/ADduction: AAROM, Left, 5 reps    General Comments        Pertinent Vitals/Pain Pain Assessment Pain Assessment: 0-10 Pain Score: 5  Pain Location: L hip/groin Pain Descriptors / Indicators: Sore, Operative site guarding Pain Intervention(s): Limited activity within patient's tolerance, Monitored during session, Repositioned, Ice  applied    Home Living                          Prior Function            PT Goals (current goals can now be found in the care plan section) Progress towards PT goals: Progressing toward goals    Frequency    Min 6X/week      PT Plan      Co-evaluation              AM-PAC PT 6 Clicks Mobility   Outcome Measure  Help needed turning from  your back to your side while in a flat bed without using bedrails?: A Little Help needed moving from lying on your back to sitting on the side of a flat bed without using bedrails?: A Little Help needed moving to and from a bed to a chair (including a wheelchair)?: A Little Help needed standing up from a chair using your arms (e.g., wheelchair or bedside chair)?: A Little Help needed to walk in hospital room?: A Little Help needed climbing 3-5 steps with a railing? : Total 6 Click Score: 16    End of Session Equipment Utilized During Treatment: Gait belt Activity Tolerance: Patient tolerated treatment well Patient left: in chair;with call bell/phone within reach   PT Visit Diagnosis: Difficulty in walking, not elsewhere classified (R26.2);Pain Pain - Right/Left: Left Pain - part of body: Hip     Time: 8954-8883 PT Time Calculation (min) (ACUTE ONLY): 31 min  Charges:    $Gait Training: 8-22 mins $Therapeutic Exercise: 8-22 mins PT General Charges $$ ACUTE PT VISIT: 1 Visit                       Dannial SQUIBB, PT Acute Rehabilitation  Office: [phone]

## 2024-05-03 NOTE — Plan of Care (Signed)
   Problem: Health Behavior/Discharge Planning: Goal: Ability to manage health-related needs will improve Outcome: Progressing   Problem: Clinical Measurements: Goal: Ability to maintain clinical measurements within normal limits will improve Outcome: Progressing Goal: Will remain free from infection Outcome: Progressing

## 2024-05-04 LAB — BASIC METABOLIC PANEL WITH GFR
Anion gap: 10 (ref 5–15)
BUN: 19 mg/dL (ref 8–23)
CO2: 24 mmol/L (ref 22–32)
Calcium: 8.6 mg/dL — ABNORMAL LOW (ref 8.9–10.3)
Chloride: 107 mmol/L (ref 98–111)
Creatinine, Ser: 0.72 mg/dL (ref 0.44–1.00)
GFR, Estimated: >60 mL/min (ref >60)
Glucose, Bld: 100 mg/dL — ABNORMAL HIGH (ref 70–99)
Potassium: 3.6 mmol/L (ref 3.5–5.1)
Sodium: 140 mmol/L (ref 135–145)

## 2024-05-04 LAB — TYPE AND SCREEN
ABO/RH(D): A POS
Antibody Screen: NEGATIVE
Unit division: 0
Unit division: 0
Unit division: 0
Unit division: 0
Unit division: 0
Unit division: 0
Unit division: 0

## 2024-05-04 LAB — BPAM RBC
Blood Product Expiration Date: 202510032359
Blood Product Expiration Date: 202510032359
Blood Product Expiration Date: 202510032359
Blood Product Expiration Date: 202510032359
Blood Product Expiration Date: 202510032359
Blood Product Expiration Date: 202510052359
Blood Product Expiration Date: 202510052359
ISSUE DATE / TIME: 202509041249
ISSUE DATE / TIME: 202509041249
ISSUE DATE / TIME: 202509041612
ISSUE DATE / TIME: 202509041612
ISSUE DATE / TIME: 202509061450
ISSUE DATE / TIME: 202509062104
Unit Type and Rh: 6200
Unit Type and Rh: 6200
Unit Type and Rh: 6200
Unit Type and Rh: 6200
Unit Type and Rh: 6200
Unit Type and Rh: 6200
Unit Type and Rh: 6200

## 2024-05-04 LAB — CBC
HCT: 32.9 % — ABNORMAL LOW (ref 36.0–46.0)
Hemoglobin: 10.7 g/dL — ABNORMAL LOW (ref 12.0–15.0)
MCH: 30.4 pg (ref 26.0–34.0)
MCHC: 32.5 g/dL (ref 30.0–36.0)
MCV: 93.5 fL (ref 80.0–100.0)
Platelets: 180 K/uL (ref 150–400)
RBC: 3.52 MIL/uL — ABNORMAL LOW (ref 3.87–5.11)
RDW: 16.9 % — ABNORMAL HIGH (ref 11.5–15.5)
WBC: 7 K/uL (ref 4.0–10.5)
nRBC: 0 % (ref 0.0–0.2)

## 2024-05-04 NOTE — TOC Progression Note (Signed)
 Transition of Care [facility]) - Progression Note    Patient Details  Name: Heather Gray MRN: [id number] Date of Birth: Dec 31, 1950  Transition of Care [facility]) CM/SW Contact  Heather DELENA Saltness, LCSW Phone Number: 05/04/2024, 11:13 AM  Clinical Narrative:    CSW met with pt at bedside to discuss discharge planning. Pt recommended for [facility] PT services upon discharge. Pt agreeable to [facility] PT. Pt denies HH agency preference. CSW sent referral to Cindie at [facility]. Cindie reports ability to accept pt for services. Pt reports having RW at home. No further TOC will continue to follow.   Expected Discharge Plan: Home w Home Health Services Barriers to Discharge: Continued Medical Work up   Expected Discharge Plan and Services In-house Referral: Clinical Social Work     Living arrangements for the past 2 months: Single Family Home                 DME Arranged: N/A DME Agency: NA    Social Drivers of Health (SDOH) Interventions SDOH Screenings   Food Insecurity: No Food Insecurity (05/01/2024)  Housing: Low Risk  (05/01/2024)  Transportation Needs: No Transportation Needs (05/01/2024)  Utilities: Not At Risk (05/01/2024)  Financial Resource Strain: Low Risk  (05/07/2022)   Received from [facility] visits prior to 10/27/2022., [facility]  Physical Activity: Sufficiently Active (05/07/2022)   Received from [employer], [facility] visits prior to 10/27/2022.  Social Connections: Unknown (05/01/2024)  Stress: Stress Concern Present (05/07/2022)   Received from [facility] visits prior to 10/27/2022., [facility]  Tobacco Use: Medium Risk (04/30/2024)    Readmission Risk Interventions    05/01/2024    2:34 PM 03/27/2024   11:03 AM  Readmission Risk Prevention Plan  Post Dischage Appt Complete Complete  Medication Screening Complete Complete  Transportation Screening Complete Complete    Signed: Heather Saltness, MSW, LCSW Clinical Social  Worker Inpatient Care Management 05/04/2024 11:16 AM

## 2024-05-04 NOTE — Progress Notes (Signed)
 Physical Therapy Treatment Patient Details Name: Heather Gray MRN: [id number] DOB: 1950-12-09 Today's Date: 05/04/2024   History of Present Illness 73 y.o. 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 Pt noted to have sacral insufficiency fractures.  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    PT Comments  Pt agreeable to working with therapy. Reports moderate pain with activity on today, rated 6/10. She continues to progress well with mobility. Good adherence to NWB status and posterior hip precautions. She plans to d/c home with caregivers. Recommend HHPT.     If plan is discharge home, recommend the following: A little help with walking and/or transfers;A lot of help with bathing/dressing/bathroom;Assistance with cooking/housework;Assist for transportation;Help with stairs or ramp for entrance   Can travel by private vehicle        Equipment Recommendations  None recommended by PT    Recommendations for Other Services       Precautions / Restrictions Precautions Precautions: Fall;Posterior Hip Precaution/Restrictions Comments: pt able to state 3/3 posterior hip and NWB precautions Restrictions Weight Bearing Restrictions Per Provider Order: Yes LLE Weight Bearing Per Provider Order: Non weight bearing     Mobility  Bed Mobility Overal bed mobility: Needs Assistance Bed Mobility: Supine to Sit     Supine to sit: Min assist, HOB elevated, Used rails     General bed mobility comments: Some assist provided to scoot on bed today.    Transfers Overall transfer level: Needs assistance Equipment used: Rolling walker (2 wheels) Transfers: Sit to/from Stand Sit to Stand: Contact guard assist           General transfer comment: good hand placement, good adherence to THP and LLE NWB status    Ambulation/Gait Ambulation/Gait assistance: Contact guard assist Gait Distance (Feet): 12 Feet Assistive device:  Rolling walker (2 wheels) Gait Pattern/deviations: Step-to pattern       General Gait Details: good adherence to NWB status. distance limited by pain/fatigue. followed with recliner and transported back to bedside   Stairs             Wheelchair Mobility     Tilt Bed    Modified Rankin (Stroke Patients Only)       Balance Overall balance assessment: Needs assistance         Standing balance support: Bilateral upper extremity supported, Reliant on assistive device for balance, During functional activity Standing balance-Leahy Scale: Poor                              Communication Communication Communication: No apparent difficulties  Cognition Arousal: Alert Behavior During Therapy: WFL for tasks assessed/performed   PT - Cognitive impairments: No apparent impairments                         Following commands: Intact      Cueing Cueing Techniques: Verbal cues  Exercises Total Joint Exercises Ankle Circles/Pumps: AROM, Both, 10 reps Quad Sets: AROM, Both, 10 reps Heel Slides: AAROM, Left, 10 reps Hip ABduction/ADduction: AAROM, Left, 10 reps Long Arc Quad: AROM, Left, 10 reps, Seated    General Comments        Pertinent Vitals/Pain Pain Assessment Pain Assessment: 0-10 Pain Score: 6  Pain Location: L hip/groin, L knee Pain Descriptors / Indicators: Operative site guarding Pain Intervention(s): Limited activity within patient's tolerance, Monitored during session, Ice applied,  Repositioned    Home Living                          Prior Function            PT Goals (current goals can now be found in the care plan section) Progress towards PT goals: Progressing toward goals    Frequency    Min 6X/week      PT Plan      Co-evaluation              AM-PAC PT 6 Clicks Mobility   Outcome Measure  Help needed turning from your back to your side while in a flat bed without using bedrails?: A  Little Help needed moving from lying on your back to sitting on the side of a flat bed without using bedrails?: A Little Help needed moving to and from a bed to a chair (including a wheelchair)?: A Little Help needed standing up from a chair using your arms (e.g., wheelchair or bedside chair)?: A Little Help needed to walk in hospital room?: A Little Help needed climbing 3-5 steps with a railing? : Total 6 Click Score: 16    End of Session Equipment Utilized During Treatment: Gait belt Activity Tolerance: Patient tolerated treatment well Patient left: in chair;with call bell/phone within reach   PT Visit Diagnosis: Difficulty in walking, not elsewhere classified (R26.2);Pain Pain - Right/Left: Left Pain - part of body: Knee     Time: 8986-8962 PT Time Calculation (min) (ACUTE ONLY): 24 min  Charges:    $Gait Training: 23-37 mins PT General Charges $$ ACUTE PT VISIT: 1 Visit                        Dannial SQUIBB, PT Acute Rehabilitation  Office: [phone]

## 2024-05-04 NOTE — Progress Notes (Addendum)
    Subjective:  Patient reports pain as mild to moderate.  Denies N/V/CP/SOB/Abd pain. She reports some pain in her thigh and knee today.  Denies tingling or numbness in LE bilaterally.  She is voiding without difficulty, urine dark, discussed the importance of staying hydrated.  Positive flatus, negative BM.    Objective:   VITALS:   Vitals:   05/03/24 0909 05/03/24 2118 05/04/24 0529 05/04/24 0805  BP: 121/69 (!) 149/71 136/69 128/78  Pulse: 61 (!) 57 (!) 57 (!) 52  Resp: 18 18 17 13   Temp: 98.1 F (36.7 C) 98.7 F (37.1 C) 97.7 F (36.5 C) 98.2 F (36.8 C)  TempSrc: Oral Oral Oral Oral  SpO2: 100% 99% 98% 100%  Weight:      Height:        NAD Neurologically intact ABD soft Neurovascular intact Sensation intact distally Intact pulses distally Dorsiflexion/Plantar flexion intact No cellulitis present Compartment soft Prevena negative pressure incisional dressing C/D/I. no leaks detected.  JP drain dressing C/D/I. 140cc SS fluid.  Moderate swelling left knee, continue to monitor.   Lab Results  Component Value Date   WBC 6.9 05/03/2024   HGB 9.1 (L) 05/03/2024   HCT 29.1 (L) 05/03/2024   MCV 91.8 05/03/2024   PLT 136 (L) 05/03/2024   BMET    Component Value Date/Time   NA 139 05/03/2024 0327   K 3.8 05/03/2024 0327   CL 109 05/03/2024 0327   CO2 21 (L) 05/03/2024 0327   GLUCOSE 98 05/03/2024 0327   BUN 21 05/03/2024 0327   CREATININE 0.82 05/03/2024 0327   CALCIUM  8.2 (L) 05/03/2024 0327   GFRNONAA >60 05/03/2024 0327     Assessment/Plan: 4 Days Post-Op   Principal Problem:   S/p revision of left total hip  ABLA. Hemoglobin 10.7. Continue to monitor. 6 units PRBCs this admission.   NWB LLE with walker and posterior hip precautions.  DVT ppx: Aspirin , SCDs, TEDS PO pain control PT/OT: She ambulated 3 and 6 feet with PT yesterday. Good maintaining NWB and posterior hip precautions.  - Patient would like to be discharged home.  -  Maintain negative pressure dressing at 75 mmHg.  - We will discontinue the JP drain when output is less than 10 cc per shift. - Mobilize out of bed with PT/OT.  - Upon discharge, the house VAC suction unit will be exchanged for a portable Prevena suction unit. She will need to follow-up in the office within 7 days of discharge for removal of her negative pressure dressing.   Valery GORMAN Potters 05/04/2024, 9:14 AM   [facility]  [place] [address], [place], [place] [address] Phone: [phone] [email or web address] Facebook  [employer]

## 2024-05-04 NOTE — Plan of Care (Signed)
   Problem: Health Behavior/Discharge Planning: Goal: Ability to manage health-related needs will improve Outcome: Progressing   Problem: Clinical Measurements: Goal: Ability to maintain clinical measurements within normal limits will improve Outcome: Progressing Goal: Will remain free from infection Outcome: Progressing

## 2024-05-05 NOTE — Anesthesia Postprocedure Evaluation (Signed)
 Anesthesia Post Note  Patient: Heather Gray  Procedure(s) Performed: REVISION, TOTAL ARTHROPLASTY, HIP, ANTERIOR APPROACH (Left: Hip)     Patient location during evaluation: PACU Anesthesia Type: General Level of consciousness: awake and alert Pain management: pain level controlled Vital Signs Assessment: post-procedure vital signs reviewed and stable Respiratory status: spontaneous breathing, nonlabored ventilation and respiratory function stable Cardiovascular status: blood pressure returned to baseline Postop Assessment: no apparent nausea or vomiting Anesthetic complications: no   No notable events documented.  Last Vitals:  Vitals:   05/04/24 1300 05/04/24 2130  BP: 139/70 (!) 154/86  Pulse: (!) 56 (!) 59  Resp: 15 15  Temp: (!) 36.3 C 37 C  SpO2:  100%    Last Pain:  Vitals:   05/05/24 0744  TempSrc:   PainSc: 8                  [address]

## 2024-05-05 NOTE — Progress Notes (Signed)
    Subjective:  Patient reports pain as mild to moderate.  Denies N/V/CP/SOB/Abd pain. She reports some increased pain in her left foot today. TED stockings placed to help with swelling control.  She denies tingling or numbness in LE bilaterally.  Positive void.  Positive flatus.    Objective:   VITALS:   Vitals:   05/04/24 0529 05/04/24 0805 05/04/24 1300 05/04/24 2130  BP: 136/69 128/78 139/70 (!) 154/86  Pulse: (!) 57 (!) 52 (!) 56 (!) 59  Resp: 17 13 15 15   Temp: 97.7 F (36.5 C) 98.2 F (36.8 C) (!) 97.3 F (36.3 C) 98.6 F (37 C)  TempSrc: Oral Oral Oral Oral  SpO2: 98% 100%  100%  Weight:      Height:        NAD Neurologically intact ABD soft Neurovascular intact Sensation intact distally Intact pulses distally Dorsiflexion/Plantar flexion intact No cellulitis present Compartment soft Prevena negative pressure incisional dressing C/D/I. no leaks detected.  JP drain dressing C/D/I. 200 cc SS fluid.    Lab Results  Component Value Date   WBC 7.0 05/04/2024   HGB 10.7 (L) 05/04/2024   HCT 32.9 (L) 05/04/2024   MCV 93.5 05/04/2024   PLT 180 05/04/2024   BMET    Component Value Date/Time   NA 140 05/04/2024 0829   K 3.6 05/04/2024 0829   CL 107 05/04/2024 0829   CO2 24 05/04/2024 0829   GLUCOSE 100 (H) 05/04/2024 0829   BUN 19 05/04/2024 0829   CREATININE 0.72 05/04/2024 0829   CALCIUM  8.6 (L) 05/04/2024 0829   GFRNONAA >60 05/04/2024 0829     Assessment/Plan: 5 Days Post-Op   Principal Problem:   S/p revision of left total hip   NWB LLE with walker and posterior hip precautions.  DVT ppx: Aspirin , SCDs, TEDS PO pain control PT/OT: 12 feet with PT yesterday. Continue today.  Dispo: - Patient would like to be discharged home.  - Maintain negative pressure dressing at 75 mmHg.  - We will discontinue the JP drain when output is less than 10 cc per shift. - Mobilize out of bed with PT/OT.  - Upon discharge, the house VAC suction  unit will be exchanged for a portable Prevena suction unit. She will need to follow-up in the office within 7 days of discharge for removal of her negative pressure dressing.     Heather Gray Potters 05/05/2024, 8:20 AM   [facility]  [place] [address], [place], [place] [address] Phone: [phone] [email or web address] Facebook  [employer]

## 2024-05-05 NOTE — Progress Notes (Signed)
 Physical Therapy Treatment Patient Details Name: Heather Gray MRN: [id number] DOB: 02-12-1951 Today's Date: 05/05/2024   History of Present Illness 73 y.o. 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 Pt noted to have sacral insufficiency fractures.  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    PT Comments  Pt agreeable to working with therapy. Report rough night 2* pain in L LE from knee down to foot. Pt reports RN and PA are aware. She participated well. Continues to adhere to precautions well. Will continue to follow. Plan is for HHPT.     If plan is discharge home, recommend the following: A little help with walking and/or transfers;A lot of help with bathing/dressing/bathroom;Assistance with cooking/housework;Assist for transportation;Help with stairs or ramp for entrance   Can travel by private vehicle        Equipment Recommendations  None recommended by PT    Recommendations for Other Services       Precautions / Restrictions Precautions Precautions: Fall;Posterior Hip Precaution/Restrictions Comments: pt able to state 3/3 posterior hip and NWB precautions Restrictions Weight Bearing Restrictions Per Provider Order: Yes LLE Weight Bearing Per Provider Order: Non weight bearing     Mobility  Bed Mobility Overal bed mobility: Needs Assistance Bed Mobility: Supine to Sit     Supine to sit: Min assist     General bed mobility comments: Some assist provided for LE on today. Increased time.    Transfers Overall transfer level: Needs assistance Equipment used: Rolling walker (2 wheels) Transfers: Sit to/from Stand Sit to Stand: Contact guard assist           General transfer comment: good hand placement, good adherence to THP and LLE NWB status    Ambulation/Gait Ambulation/Gait assistance: Contact guard assist Gait Distance (Feet): 12 Feet Assistive device: Rolling walker (2 wheels) Gait  Pattern/deviations: Step-to pattern       General Gait Details: good adherence to NWB status. pt wore shoe on R foot.  distance limited by pain/fatigue. followed with recliner and transported back to bedside   Stairs             Wheelchair Mobility     Tilt Bed    Modified Rankin (Stroke Patients Only)       Balance Overall balance assessment: Needs assistance         Standing balance support: Bilateral upper extremity supported, Reliant on assistive device for balance, During functional activity Standing balance-Leahy Scale: Poor                              Communication Communication Communication: No apparent difficulties  Cognition Arousal: Alert Behavior During Therapy: WFL for tasks assessed/performed   PT - Cognitive impairments: No apparent impairments                         Following commands: Intact      Cueing Cueing Techniques: Verbal cues  Exercises Total Joint Exercises Ankle Circles/Pumps: AROM, Both, 10 reps Long Arc Quad: AROM, Left, 10 reps    General Comments        Pertinent Vitals/Pain Pain Assessment Pain Assessment: Faces Faces Pain Scale: Hurts even more Pain Location: leg from knee down to foot Pain Descriptors / Indicators: Heaviness, Radiating Pain Intervention(s): Limited activity within patient's tolerance, Monitored during session, Ice applied, Repositioned    Home Living  Prior Function            PT Goals (current goals can now be found in the care plan section) Progress towards PT goals: Progressing toward goals    Frequency    Min 6X/week      PT Plan      Co-evaluation              AM-PAC PT 6 Clicks Mobility   Outcome Measure  Help needed turning from your back to your side while in a flat bed without using bedrails?: A Little Help needed moving from lying on your back to sitting on the side of a flat bed without using  bedrails?: A Little Help needed moving to and from a bed to a chair (including a wheelchair)?: A Little Help needed standing up from a chair using your arms (e.g., wheelchair or bedside chair)?: A Little Help needed to walk in hospital room?: A Little Help needed climbing 3-5 steps with a railing? : Total 6 Click Score: 16    End of Session Equipment Utilized During Treatment: Gait belt Activity Tolerance: Patient tolerated treatment well Patient left: in chair;with call bell/phone within reach   PT Visit Diagnosis: Difficulty in walking, not elsewhere classified (R26.2);Pain Pain - Right/Left: Left Pain - part of body: Knee     Time: 0921-0950 PT Time Calculation (min) (ACUTE ONLY): 29 min  Charges:    $Gait Training: 23-37 mins PT General Charges $$ ACUTE PT VISIT: 1 Visit                        Dannial SQUIBB, PT Acute Rehabilitation  Office: [phone]

## 2024-05-06 NOTE — Progress Notes (Signed)
    Subjective:  Patient reports pain as mild to moderate.  Denies N/V/CP/SOB/Abd pain. Denies tingling or numbness in LE bilaterally.  She is doing well. Has some thigh and groin pain this am.  Doing okay with the medications.  BM yesterday. Voiding without difficulty.   Objective:   VITALS:   Vitals:   05/05/24 1315 05/05/24 2020 05/05/24 2308 05/06/24 0606  BP: (!) 144/68 133/77 139/72 111/66  Pulse: (!) 55 (!) 59 60 (!) 58  Resp: 18 16 18 18   Temp: 98.3 F (36.8 C) 98.3 F (36.8 C) 98.2 F (36.8 C) 98.2 F (36.8 C)  TempSrc: Oral     SpO2: 99% 96% 98% 97%  Weight:      Height:        NAD Neurologically intact ABD soft Neurovascular intact Sensation intact distally Intact pulses distally Dorsiflexion/Plantar flexion intact No cellulitis present Compartment soft Prevena negative pressure incisional dressing C/D/I. no leaks detected.  JP drain dressing C/D/I. 310 cc SS fluid.   Lab Results  Component Value Date   WBC 7.0 05/04/2024   HGB 10.7 (L) 05/04/2024   HCT 32.9 (L) 05/04/2024   MCV 93.5 05/04/2024   PLT 180 05/04/2024   BMET    Component Value Date/Time   NA 140 05/04/2024 0829   K 3.6 05/04/2024 0829   CL 107 05/04/2024 0829   CO2 24 05/04/2024 0829   GLUCOSE 100 (H) 05/04/2024 0829   BUN 19 05/04/2024 0829   CREATININE 0.72 05/04/2024 0829   CALCIUM  8.6 (L) 05/04/2024 0829   GFRNONAA >60 05/04/2024 0829     Assessment/Plan: 6 Days Post-Op   Principal Problem:   S/p revision of left total hip   NWB LLE with walker and posterior hip precautions.  DVT ppx: Aspirin , SCDs, TEDS PO pain control PT/OT: 12 feet with PT yesterday. Continue today.  Dispo: - Patient would like to be discharged home.  - Maintain negative pressure dressing at 75 mmHg.  - We will discontinue the JP drain when output is less than 10 cc per shift. - Mobilize out of bed with PT/OT.  - Upon discharge, the house VAC suction unit will be exchanged for a  portable Prevena suction unit. She will need to follow-up in the office within 7 days of discharge for removal of her negative pressure dressing.   Valery GORMAN Potters 05/06/2024, 8:40 AM   [facility]  [place] [address], [place], [place] [address] Phone: [phone] [email or web address] Facebook  [employer]

## 2024-05-06 NOTE — Plan of Care (Signed)
  Problem: Clinical Measurements: Goal: Will remain free from infection Outcome: Progressing   Problem: Activity: Goal: Risk for activity intolerance will decrease Outcome: Progressing   Problem: Nutrition: Goal: Adequate nutrition will be maintained Outcome: Progressing   Problem: Safety: Goal: Ability to remain free from injury will improve Outcome: Progressing   

## 2024-05-06 NOTE — Progress Notes (Signed)
 Physical Therapy Treatment Patient Details Name: Heather Gray MRN: [id number] DOB: 28-Jun-1951 Today's Date: 05/06/2024   History of Present Illness 73 y.o. 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 Pt noted to have sacral insufficiency fractures.  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    PT Comments  2nd tx session. Pt continues to participate/perform well. She adheres to hip precautions and WB restrictions well. Pt should be fine to discharge home with supervision/assist from caregivers and HHPT f/u.    If plan is discharge home, recommend the following: A little help with walking and/or transfers;A lot of help with bathing/dressing/bathroom;Assistance with cooking/housework;Assist for transportation;Help with stairs or ramp for entrance   Can travel by private vehicle        Equipment Recommendations  None recommended by PT    Recommendations for Other Services       Precautions / Restrictions Precautions Precautions: Fall;Posterior Hip Recall of Precautions/Restrictions: Intact Precaution/Restrictions Comments: pt able to state 3/3 posterior hip and NWB precautions Restrictions Weight Bearing Restrictions Per Provider Order: Yes LLE Weight Bearing Per Provider Order: Non weight bearing     Mobility  Bed Mobility Overal bed mobility: Needs Assistance Bed Mobility: Supine to Sit          General bed mobility comments: oob in recliner    Transfers Overall transfer level: Needs assistance Equipment used: Rolling walker (2 wheels) Transfers: Sit to/from Stand Sit to Stand: Contact guard assist           General transfer comment: good hand placement, good adherence to THP and LLE NWB status    Ambulation/Gait Ambulation/Gait assistance: Contact guard assist Gait Distance (Feet): 24 Feet Assistive device: Rolling walker (2 wheels) Gait Pattern/deviations: Step-to pattern       General Gait  Details: good adherence to NWB status on L. pt wore shoe on R foot. no chair follow this session   Stairs             Wheelchair Mobility     Tilt Bed    Modified Rankin (Stroke Patients Only)       Balance Overall balance assessment: Needs assistance         Standing balance support: Bilateral upper extremity supported, Reliant on assistive device for balance, During functional activity Standing balance-Leahy Scale: Poor                              Communication Communication Communication: No apparent difficulties  Cognition Arousal: Alert Behavior During Therapy: WFL for tasks assessed/performed   PT - Cognitive impairments: No apparent impairments                         Following commands: Intact      Cueing Cueing Techniques: Verbal cues  Exercises Total Joint Exercises Ankle Circles/Pumps: AROM, Both, 10 reps Heel Slides: AAROM, Left, 10 reps Hip ABduction/ADduction: AAROM, Left, 10 reps Long Arc Quad: Left, AROM, 10 reps    General Comments        Pertinent Vitals/Pain Pain Assessment Pain Assessment: Faces Pain Score: 5  Faces Pain Scale: Hurts even more Pain Location: leg from knee down to foot Pain Descriptors / Indicators: Heaviness, Radiating Pain Intervention(s): Limited activity within patient's tolerance, Monitored during session, Repositioned    Home Living  Prior Function            PT Goals (current goals can now be found in the care plan section) Progress towards PT goals: Progressing toward goals    Frequency    Min 6X/week      PT Plan      Co-evaluation              AM-PAC PT 6 Clicks Mobility   Outcome Measure  Help needed turning from your back to your side while in a flat bed without using bedrails?: A Little Help needed moving from lying on your back to sitting on the side of a flat bed without using bedrails?: A Little Help needed  moving to and from a bed to a chair (including a wheelchair)?: A Little Help needed standing up from a chair using your arms (e.g., wheelchair or bedside chair)?: A Little Help needed to walk in hospital room?: A Little Help needed climbing 3-5 steps with a railing? : Total 6 Click Score: 16    End of Session Equipment Utilized During Treatment: Gait belt Activity Tolerance: Patient tolerated treatment well Patient left: in chair;with call bell/phone within reach;with family/visitor present   PT Visit Diagnosis: Difficulty in walking, not elsewhere classified (R26.2);Pain Pain - Right/Left: Left Pain - part of body: Leg;Ankle and joints of foot     Time: 8596-8583 PT Time Calculation (min) (ACUTE ONLY): 13 min  Charges:    $Gait Training: 8-22 mins PT General Charges $$ ACUTE PT VISIT: 1 Visit                         Dannial SQUIBB, PT Acute Rehabilitation  Office: [phone]

## 2024-05-06 NOTE — Plan of Care (Signed)
  Problem: Safety: Goal: Ability to remain free from injury will improve Outcome: Progressing   Problem: Education: Goal: Knowledge of the prescribed therapeutic regimen will improve Outcome: Progressing   Problem: Clinical Measurements: Goal: Postoperative complications will be avoided or minimized Outcome: Progressing   Problem: Pain Management: Goal: Pain level will decrease with appropriate interventions Outcome: Progressing

## 2024-05-06 NOTE — Progress Notes (Addendum)
 Physical Therapy Treatment Patient Details Name: Heather Gray MRN: [id number] DOB: 1951-07-04 Today's Date: 05/06/2024   History of Present Illness 73 y.o. 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 Pt noted to have sacral insufficiency fractures.  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    PT Comments  Pt agreeable to working with therapy. She continues to participate/perform well. She adheres to hip precautions and WB restrictions well. Pt should be fine to discharge home with supervision/assist from caregivers and HHPT f/u. Will have a 2nd session today at the request of orthopedic PA.    If plan is discharge home, recommend the following: A little help with walking and/or transfers;A lot of help with bathing/dressing/bathroom;Assistance with cooking/housework;Assist for transportation;Help with stairs or ramp for entrance   Can travel by private vehicle        Equipment Recommendations  None recommended by PT    Recommendations for Other Services       Precautions / Restrictions Precautions Precautions: Fall;Posterior Hip Precaution/Restrictions Comments: pt able to state 3/3 posterior hip and NWB precautions Restrictions Weight Bearing Restrictions Per Provider Order: Yes LLE Weight Bearing Per Provider Order: Non weight bearing     Mobility  Bed Mobility Overal bed mobility: Needs Assistance Bed Mobility: Supine to Sit     Supine to sit: Min assist     General bed mobility comments: Some assist provided for LE and for scooting on today. Increased time.    Transfers Overall transfer level: Needs assistance Equipment used: Rolling walker (2 wheels) Transfers: Sit to/from Stand Sit to Stand: Contact guard assist           General transfer comment: good hand placement, good adherence to THP and LLE NWB status    Ambulation/Gait Ambulation/Gait assistance: Contact guard assist Gait Distance  (Feet): 20 Feet Assistive device: Rolling walker (2 wheels) Gait Pattern/deviations: Step-to pattern       General Gait Details: good adherence to NWB status. pt wore shoe on R foot. followed with recliner and transported back to bedside   Stairs             Wheelchair Mobility     Tilt Bed    Modified Rankin (Stroke Patients Only)       Balance Overall balance assessment: Needs assistance         Standing balance support: Bilateral upper extremity supported, Reliant on assistive device for balance, During functional activity Standing balance-Leahy Scale: Poor                              Communication Communication Communication: No apparent difficulties  Cognition Arousal: Alert Behavior During Therapy: WFL for tasks assessed/performed   PT - Cognitive impairments: No apparent impairments                         Following commands: Intact      Cueing Cueing Techniques: Verbal cues  Exercises Total Joint Exercises Ankle Circles/Pumps: AROM, Both, 10 reps Heel Slides: AAROM, Left, 10 reps Hip ABduction/ADduction: AAROM, Left, 10 reps    General Comments        Pertinent Vitals/Pain Pain Assessment Pain Assessment: 0-10 Pain Score: 5  Pain Location: leg from knee down to foot Pain Descriptors / Indicators: Heaviness, Radiating Pain Intervention(s): Limited activity within patient's tolerance, Monitored during session, Ice applied, Repositioned    Home Living  Prior Function            PT Goals (current goals can now be found in the care plan section) Progress towards PT goals: Progressing toward goals    Frequency    Min 6X/week      PT Plan      Co-evaluation              AM-PAC PT 6 Clicks Mobility   Outcome Measure  Help needed turning from your back to your side while in a flat bed without using bedrails?: A Little Help needed moving from lying on your back  to sitting on the side of a flat bed without using bedrails?: A Little Help needed moving to and from a bed to a chair (including a wheelchair)?: A Little Help needed standing up from a chair using your arms (e.g., wheelchair or bedside chair)?: A Little Help needed to walk in hospital room?: A Little Help needed climbing 3-5 steps with a railing? : Total 6 Click Score: 16    End of Session Equipment Utilized During Treatment: Gait belt Activity Tolerance: Patient tolerated treatment well Patient left: in chair;with call bell/phone within reach;with family/visitor present   PT Visit Diagnosis: Difficulty in walking, not elsewhere classified (R26.2);Pain Pain - Right/Left: Left Pain - part of body: Knee     Time: 8971-8948 PT Time Calculation (min) (ACUTE ONLY): 23 min  Charges:    $Gait Training: 23-37 mins PT General Charges $$ ACUTE PT VISIT: 1 Visit                        Dannial SQUIBB, PT Acute Rehabilitation  Office: [phone]

## 2024-05-07 LAB — CBC
HCT: 31 % — ABNORMAL LOW (ref 36.0–46.0)
Hemoglobin: 9.9 g/dL — ABNORMAL LOW (ref 12.0–15.0)
MCH: 30.4 pg (ref 26.0–34.0)
MCHC: 31.9 g/dL (ref 30.0–36.0)
MCV: 95.1 fL (ref 80.0–100.0)
Platelets: 223 K/uL (ref 150–400)
RBC: 3.26 MIL/uL — ABNORMAL LOW (ref 3.87–5.11)
RDW: 15.9 % — ABNORMAL HIGH (ref 11.5–15.5)
WBC: 6.5 K/uL (ref 4.0–10.5)
nRBC: 0 % (ref 0.0–0.2)

## 2024-05-07 LAB — BASIC METABOLIC PANEL WITH GFR
Anion gap: 12 (ref 5–15)
BUN: 23 mg/dL (ref 8–23)
CO2: 24 mmol/L (ref 22–32)
Calcium: 9 mg/dL (ref 8.9–10.3)
Chloride: 106 mmol/L (ref 98–111)
Creatinine, Ser: 0.98 mg/dL (ref 0.44–1.00)
GFR, Estimated: >60 mL/min (ref >60)
Glucose, Bld: 106 mg/dL — ABNORMAL HIGH (ref 70–99)
Potassium: 4 mmol/L (ref 3.5–5.1)
Sodium: 142 mmol/L (ref 135–145)

## 2024-05-07 MED ORDER — QUETIAPINE FUMARATE 100 MG PO TABS
350.0000 mg | ORAL_TABLET | Freq: Every day | ORAL | Status: DC
  Administered 2024-05-07: 350 mg via ORAL
  Filled 2024-05-07: qty 3.5

## 2024-05-07 NOTE — Progress Notes (Signed)
 Physical Therapy Treatment Patient Details Name: Heather Gray MRN: [id number] DOB: Feb 22, 1951 Today's Date: 05/07/2024   History of Present Illness 73 y.o. 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 Pt noted to have sacral insufficiency fractures.  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    PT Comments  Pt progressing with PT. Activity tol improving, able to amb 16' with RW and CGA. Continue PT in acute setting.     If plan is discharge home, recommend the following: A little help with walking and/or transfers;A lot of help with bathing/dressing/bathroom;Assistance with cooking/housework;Assist for transportation;Help with stairs or ramp for entrance   Can travel by private vehicle        Equipment Recommendations  None recommended by PT    Recommendations for Other Services       Precautions / Restrictions Precautions Precautions: Fall;Posterior Hip Recall of Precautions/Restrictions: Intact Precaution/Restrictions Comments: pt able to state 3/3 posterior hip and NWB precautions Restrictions LLE Weight Bearing Per Provider Order: Non weight bearing     Mobility  Bed Mobility   Bed Mobility: Supine to Sit     Supine to sit: Supervision     General bed mobility comments: able to self progress LLE off bed with leg lifterm incr time    Transfers Overall transfer level: Needs assistance Equipment used: Rolling walker (2 wheels) Transfers: Sit to/from Stand Sit to Stand: Contact guard assist           General transfer comment: good hand placement, good adherence to THP and LLE NWB status    Ambulation/Gait Ambulation/Gait assistance: Contact guard assist Gait Distance (Feet): 33 Feet Assistive device: Rolling walker (2 wheels) Gait Pattern/deviations: Step-to pattern Gait velocity: decr     General Gait Details: good adherence to NWB status on L. pt wore shoe on R foot. no chair follow this  session. CGA for safety   Stairs             Wheelchair Mobility     Tilt Bed    Modified Rankin (Stroke Patients Only)       Balance             Standing balance-Leahy Scale: Poor Standing balance comment: reliant on RW                            Communication Communication Communication: No apparent difficulties  Cognition Arousal: Alert Behavior During Therapy: WFL for tasks assessed/performed   PT - Cognitive impairments: No apparent impairments                         Following commands: Intact      Cueing Cueing Techniques: Verbal cues  Exercises Total Joint Exercises Ankle Circles/Pumps: AROM, Both, 10 reps Quad Sets: AROM, Both, 10 reps Heel Slides: AAROM, Left, 10 reps Hip ABduction/ADduction: AAROM, Left, 10 reps    General Comments        Pertinent Vitals/Pain Pain Assessment Pain Assessment: Faces Faces Pain Scale: Hurts little more Pain Location: LLE/sacral area Pain Descriptors / Indicators: Discomfort, Grimacing Pain Intervention(s): Limited activity within patient's tolerance, Monitored during session    Home Living                          Prior Function            PT Goals (current  goals can now be found in the care plan section) Acute Rehab PT Goals Patient Stated Goal: hopes to DC home PT Goal Formulation: With patient Time For Goal Achievement: 05/15/24 Potential to Achieve Goals: Good Progress towards PT goals: Progressing toward goals    Frequency    Min 6X/week      PT Plan      Co-evaluation              AM-PAC PT 6 Clicks Mobility   Outcome Measure  Help needed turning from your back to your side while in a flat bed without using bedrails?: A Little Help needed moving from lying on your back to sitting on the side of a flat bed without using bedrails?: A Little Help needed moving to and from a bed to a chair (including a wheelchair)?: A Little Help needed  standing up from a chair using your arms (e.g., wheelchair or bedside chair)?: A Little Help needed to walk in hospital room?: A Little Help needed climbing 3-5 steps with a railing? : A Little 6 Click Score: 18    End of Session Equipment Utilized During Treatment: Gait belt Activity Tolerance: Patient tolerated treatment well Patient left: in chair;with call bell/phone within reach;with chair alarm set Nurse Communication: Mobility status PT Visit Diagnosis: Difficulty in walking, not elsewhere classified (R26.2);Pain Pain - Right/Left: Left Pain - part of body: Leg;Ankle and joints of foot     Time: 1411-1433 PT Time Calculation (min) (ACUTE ONLY): 22 min  Charges:    $Gait Training: 8-22 mins PT General Charges $$ ACUTE PT VISIT: 1 Visit                     Jovanna Hodges, PT  Acute Rehab Dept [facility]) [phone]  05/07/2024    [facility] 05/07/2024, 4:29 PM

## 2024-05-07 NOTE — Plan of Care (Signed)
  Problem: Activity: Goal: Risk for activity intolerance will decrease Outcome: Progressing   Problem: Nutrition: Goal: Adequate nutrition will be maintained Outcome: Progressing   Problem: Coping: Goal: Level of anxiety will decrease Outcome: Progressing   Problem: Elimination: Goal: Will not experience complications related to urinary retention Outcome: Progressing   Problem: Pain Managment: Goal: General experience of comfort will improve and/or be controlled Outcome: Progressing   Problem: Safety: Goal: Ability to remain free from injury will improve Outcome: Progressing   Problem: Skin Integrity: Goal: Risk for impaired skin integrity will decrease Outcome: Progressing   Problem: Pain Management: Goal: Pain level will decrease with appropriate interventions Outcome: Progressing

## 2024-05-07 NOTE — Progress Notes (Signed)
 Physical Therapy Treatment Patient Details Name: Heather Gray MRN: [id number] DOB: 05-09-1951 Today's Date: 05/07/2024   History of Present Illness 73 y.o. 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 Pt noted to have sacral insufficiency fractures.  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    PT Comments  Pt progressing well. Tol incr distance, amb 30' with CGA for safety. Pt states she has scooter at home and also uses that in the house. Continue PT in acute setting    If plan is discharge home, recommend the following: A little help with walking and/or transfers;A lot of help with bathing/dressing/bathroom;Assistance with cooking/housework;Assist for transportation;Help with stairs or ramp for entrance   Can travel by private vehicle        Equipment Recommendations  None recommended by PT    Recommendations for Other Services       Precautions / Restrictions Precautions Precautions: Fall;Posterior Hip Recall of Precautions/Restrictions: Intact Precaution/Restrictions Comments: pt able to state 3/3 posterior hip and NWB precautions Restrictions LLE Weight Bearing Per Provider Order: Non weight bearing     Mobility  Bed Mobility   Bed Mobility: Supine to Sit     Supine to sit: Supervision     General bed mobility comments: able to self progress LLE off bed with leg lifterm incr time    Transfers Overall transfer level: Needs assistance Equipment used: Rolling walker (2 wheels) Transfers: Sit to/from Stand Sit to Stand: Contact guard assist           General transfer comment: good hand placement, good adherence to THP and LLE NWB status    Ambulation/Gait Ambulation/Gait assistance: Contact guard assist Gait Distance (Feet): 30 Feet Assistive device: Rolling walker (2 wheels) Gait Pattern/deviations: Step-to pattern Gait velocity: decr     General Gait Details: good adherence to NWB status on  L. pt wore shoe on R foot. no chair follow this session. CGA for safety   Stairs             Wheelchair Mobility     Tilt Bed    Modified Rankin (Stroke Patients Only)       Balance             Standing balance-Leahy Scale: Poor Standing balance comment: reliant on RW                            Communication Communication Communication: No apparent difficulties  Cognition Arousal: Alert Behavior During Therapy: WFL for tasks assessed/performed   PT - Cognitive impairments: No apparent impairments                         Following commands: Intact      Cueing Cueing Techniques: Verbal cues  Exercises Total Joint Exercises Ankle Circles/Pumps: AROM, Both, 10 reps Quad Sets: AROM, Both, 10 reps Heel Slides: AAROM, Left, 10 reps Hip ABduction/ADduction: AAROM, Left, 10 reps    General Comments        Pertinent Vitals/Pain Pain Assessment Pain Assessment: Faces Faces Pain Scale: Hurts little more Pain Location: LLE/sacral area Pain Descriptors / Indicators: Discomfort, Grimacing Pain Intervention(s): Limited activity within patient's tolerance, Monitored during session, Premedicated before session, Repositioned, Ice applied    Home Living  Prior Function            PT Goals (current goals can now be found in the care plan section) Acute Rehab PT Goals Patient Stated Goal: hopes to DC home PT Goal Formulation: With patient Time For Goal Achievement: 05/15/24 Potential to Achieve Goals: Good Progress towards PT goals: Progressing toward goals    Frequency    Min 6X/week      PT Plan      Co-evaluation              AM-PAC PT 6 Clicks Mobility   Outcome Measure  Help needed turning from your back to your side while in a flat bed without using bedrails?: A Little Help needed moving from lying on your back to sitting on the side of a flat bed without using bedrails?: A  Little Help needed moving to and from a bed to a chair (including a wheelchair)?: A Little Help needed standing up from a chair using your arms (e.g., wheelchair or bedside chair)?: A Little Help needed to walk in hospital room?: A Little Help needed climbing 3-5 steps with a railing? : Total 6 Click Score: 16    End of Session Equipment Utilized During Treatment: Gait belt Activity Tolerance: Patient tolerated treatment well Patient left: in chair;with call bell/phone within reach;with chair alarm set Nurse Communication: Mobility status PT Visit Diagnosis: Difficulty in walking, not elsewhere classified (R26.2);Pain Pain - Right/Left: Left Pain - part of body: Leg;Ankle and joints of foot     Time: 0950-1015 PT Time Calculation (min) (ACUTE ONLY): 25 min  Charges:    $Gait Training: 8-22 mins $Therapeutic Exercise: 8-22 mins PT General Charges $$ ACUTE PT VISIT: 1 Visit                     Monick Rena, PT  Acute Rehab Dept [facility]) [phone]  05/07/2024    [facility] 05/07/2024, 10:34 AM

## 2024-05-07 NOTE — TOC Transition Note (Signed)
 Transition of Care [facility]) - Discharge Note   Patient Details  Name: Heather Gray MRN: [id number] Date of Birth: March 13, 1951  Transition of [facility]) CM/SW Contact:  NORMAN ASPEN, LCSW Phone Number: 05/07/2024, 11:27 AM   Clinical Narrative:     Per Valery Potters, PA, no HHPT is recommended by MD.  Pt aware. Referral to [facility] has been cancelled.  Pt has needed DME in the home.  TOC will sign off at this time.  Final next level of care: Home/Self Care Barriers to Discharge: Barriers Resolved   Patient Goals and CMS Choice Patient states their goals for this hospitalization and ongoing recovery are:: return home          Discharge Placement                       Discharge Plan and Services Additional resources added to the After Visit Summary for   In-house Referral: Clinical Social Work              DME Arranged: N/A DME Agency: NA       HH Arranged: NA HH Agency: NA        Social Drivers of Health (SDOH) Interventions SDOH Screenings   Food Insecurity: No Food Insecurity (05/01/2024)  Housing: Low Risk  (05/01/2024)  Transportation Needs: No Transportation Needs (05/01/2024)  Utilities: Not At Risk (05/01/2024)  Financial Resource Strain: Low Risk  (05/07/2022)   Received from [facility] visits prior to 10/27/2022., [facility]  Physical Activity: Sufficiently Active (05/07/2022)   [facility] visits prior to 10/27/2022.  Social Connections: Unknown (05/01/2024)  Stress: Stress Concern Present (05/07/2022)   Received from [facility] visits prior to 10/27/2022., [facility]  Tobacco Use: Medium Risk (04/30/2024)     Readmission Risk Interventions    05/01/2024    2:34 PM 03/27/2024   11:03 AM  Readmission Risk Prevention Plan  Post Dischage Appt Complete Complete  Medication Screening Complete Complete  Transportation Screening Complete Complete

## 2024-05-08 ENCOUNTER — Other Ambulatory Visit (HOSPITAL_COMMUNITY): Payer: Self-pay

## 2024-05-08 MED ORDER — ASPIRIN 81 MG PO CHEW
81.0000 mg | CHEWABLE_TABLET | Freq: Two times a day (BID) | ORAL | 0 refills | Status: AC
  Filled 2024-05-08: qty 90, 45d supply, fill #0

## 2024-05-08 MED ORDER — DOCUSATE SODIUM 100 MG PO CAPS
100.0000 mg | ORAL_CAPSULE | Freq: Two times a day (BID) | ORAL | 0 refills | Status: AC
  Filled 2024-05-08: qty 60, 30d supply, fill #0

## 2024-05-08 MED ORDER — SENNA 8.6 MG PO TABS
2.0000 | ORAL_TABLET | Freq: Every day | ORAL | 0 refills | Status: AC
  Filled 2024-05-08: qty 30, 15d supply, fill #0

## 2024-05-08 MED ORDER — HYDROCODONE-ACETAMINOPHEN 5-325 MG PO TABS
1.0000 | ORAL_TABLET | ORAL | 0 refills | Status: AC | PRN
  Filled 2024-05-08: qty 42, 7d supply, fill #0

## 2024-05-08 MED ORDER — ONDANSETRON HCL 4 MG PO TABS
4.0000 mg | ORAL_TABLET | Freq: Three times a day (TID) | ORAL | 0 refills | Status: AC | PRN
  Filled 2024-05-08: qty 30, 10d supply, fill #0

## 2024-05-08 MED ORDER — POLYETHYLENE GLYCOL 3350 17 GM/SCOOP PO POWD
17.0000 g | Freq: Every day | ORAL | 0 refills | Status: AC | PRN
  Filled 2024-05-08: qty 238, 14d supply, fill #0

## 2024-05-08 NOTE — Progress Notes (Signed)
 Discharge paperwork printed and reviewed with the patient. Patient education provided on how to drain JP drain and keep record of it as ordered by MD. Pt made aware of the follow up appointment with Ortho Dr. Anda vac changed to Prevena. Awaiting on the family for transportation.

## 2024-05-08 NOTE — Progress Notes (Signed)
 Physical Therapy Treatment Patient Details Name: Heather Gray MRN: [id number] DOB: [birth year] Today's Date: 05/08/2024   History of Present Illness 73 y.o. 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 Pt noted to have sacral insufficiency fractures.  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    PT Comments  Pt performed LE exercises in recliner and then ambulated short distance.  Pt able to correctly verbalize and adhere to her posterior hip precautions and NWB status.  Pt feels ready for d/c home today and had no further questions.     If plan is discharge home, recommend the following: A little help with walking and/or transfers;A lot of help with bathing/dressing/bathroom;Assistance with cooking/housework;Assist for transportation;Help with stairs or ramp for entrance   Can travel by private vehicle        Equipment Recommendations  None recommended by PT    Recommendations for Other Services       Precautions / Restrictions Precautions Precautions: Fall;Posterior Hip Precaution/Restrictions Comments: pt able to state 3/3 posterior hip and NWB precautions Restrictions Weight Bearing Restrictions Per Provider Order: Yes LLE Weight Bearing Per Provider Order: Non weight bearing     Mobility  Bed Mobility               General bed mobility comments: pt in recliner    Transfers Overall transfer level: Needs assistance Equipment used: Rolling walker (2 wheels) Transfers: Sit to/from Stand Sit to Stand: Supervision           General transfer comment: good hand placement, good adherence to THP and LLE NWB status    Ambulation/Gait Ambulation/Gait assistance: Contact guard assist, Supervision Gait Distance (Feet): 36 Feet Assistive device: Rolling walker (2 wheels) Gait Pattern/deviations: Step-to pattern Gait velocity: decr     General Gait Details: good adherence to NWB status on L. pt wore shoe  on R foot. no chair follow this session. CGA for safety   Stairs             Wheelchair Mobility     Tilt Bed    Modified Rankin (Stroke Patients Only)       Balance                                            Communication Communication Communication: No apparent difficulties  Cognition Arousal: Alert Behavior During Therapy: WFL for tasks assessed/performed   PT - Cognitive impairments: No apparent impairments                         Following commands: Intact      Cueing Cueing Techniques: Verbal cues  Exercises Total Joint Exercises Ankle Circles/Pumps: AROM, Both, 10 reps Quad Sets: AROM, Both, 10 reps Short Arc Quad: AROM, Left, Supine, 15 reps Heel Slides: AAROM, Left, 10 reps Hip ABduction/ADduction: AAROM, Left, 10 reps    General Comments        Pertinent Vitals/Pain Pain Assessment Pain Assessment: Faces Faces Pain Scale: Hurts a little bit Pain Location: LLE/sacral area Pain Descriptors / Indicators: Discomfort, Grimacing Pain Intervention(s): Monitored during session, Limited activity within patient's tolerance    Home Living                          Prior Function  PT Goals (current goals can now be found in the care plan section) Progress towards PT goals: Progressing toward goals    Frequency    Min 6X/week      PT Plan      Co-evaluation              AM-PAC PT 6 Clicks Mobility   Outcome Measure  Help needed turning from your back to your side while in a flat bed without using bedrails?: A Little Help needed moving from lying on your back to sitting on the side of a flat bed without using bedrails?: A Little Help needed moving to and from a bed to a chair (including a wheelchair)?: A Little Help needed standing up from a chair using your arms (e.g., wheelchair or bedside chair)?: A Little Help needed to walk in hospital room?: A Little Help needed climbing  3-5 steps with a railing? : A Little 6 Click Score: 18    End of Session Equipment Utilized During Treatment: Gait belt Activity Tolerance: Patient tolerated treatment well Patient left: in chair;with call bell/phone within reach   PT Visit Diagnosis: Difficulty in walking, not elsewhere classified (R26.2)     Time: 1019-1040 PT Time Calculation (min) (ACUTE ONLY): 21 min  Charges:    $Gait Training: 8-22 mins PT General Charges $$ ACUTE PT VISIT: 1 Visit                     Tari KLEIN, DPT Physical Therapist Acute Rehabilitation Services Office: [phone]    Heather Gray 05/08/2024, 1:10 PM

## 2024-05-08 NOTE — Care Management Important Message (Signed)
 Important Message  Patient Details IM Letter given to the Patient Name: Heather Gray MRN: [id number] Date of Birth: 1950/09/01   Important Message Given:  Yes - Medicare IM     Marius Betts 05/08/2024, 9:43 AM

## 2024-05-08 NOTE — Progress Notes (Signed)
    Subjective:  Patient reports pain as mild to moderate.  Denies N/V/CP/SOB/Abd pain.  She reports she is doing well today.  She is ready to go home.  Still having pain in knee and hip, as to be expected. She reports she is doing better with PT.   Objective:   VITALS:   Vitals:   05/07/24 0538 05/07/24 1316 05/07/24 2017 05/08/24 0534  BP: 103/60 134/74 (!) 145/73 116/64  Pulse: (!) 54 (!) 57 60 (!) 57  Resp: 18 20 18 17   Temp: 97.6 F (36.4 C) 97.8 F (36.6 C) 98.4 F (36.9 C) 97.7 F (36.5 C)  TempSrc: Oral Oral Oral Oral  SpO2: 97% 99% 100% 98%  Weight:      Height:        NAD Neurologically intact ABD soft Neurovascular intact Sensation intact distally Intact pulses distally Dorsiflexion/Plantar flexion intact No cellulitis present Compartment soft Prevena negative pressure incisional dressing C/D/I. no leaks detected.  JP drain dressing C/D/I. 200 cc SS fluid.   Lab Results  Component Value Date   WBC 6.5 05/07/2024   HGB 9.9 (L) 05/07/2024   HCT 31.0 (L) 05/07/2024   MCV 95.1 05/07/2024   PLT 223 05/07/2024   BMET    Component Value Date/Time   NA 142 05/07/2024 0324   K 4.0 05/07/2024 0324   CL 106 05/07/2024 0324   CO2 24 05/07/2024 0324   GLUCOSE 106 (H) 05/07/2024 0324   BUN 23 05/07/2024 0324   CREATININE 0.98 05/07/2024 0324   CALCIUM  9.0 05/07/2024 0324   GFRNONAA >60 05/07/2024 0324     Assessment/Plan: 8 Days Post-Op   Principal Problem:   S/p revision of left total hip  ABLA hemoglobin 9.9, stable. Asymptomatic. Continue to monitor.   NWB LLE with walker and posterior hip precautions.  DVT ppx: Aspirin , SCDs, TEDS PO pain control PT/OT: 20 and 24 feet with PT yesterday. Continue today.  Dispo: - Patient would like to be discharged home.  - Maintain negative pressure dressing at 75 mmHg.  - We will discontinue the JP drain when output is less than 10 cc per shift. - Mobilize out of bed with PT/OT.  - Upon  discharge, the house VAC suction unit will be exchanged for a portable Prevena suction unit. She will need to follow-up in the office within 7 days of discharge for removal of her negative pressure dressing.   Heather Gray 05/08/2024, 6:03 PM   [facility]  [place] [address], [address], [place], [place] [address] Phone: [phone] [email or web address] Facebook  [employer]

## 2024-05-08 NOTE — Progress Notes (Signed)
 Patient ID: Heather Gray, female   DOB: 1950-11-27, 73 y.o.   MRN: [id number]    Subjective:  Patient reports pain as mild to moderate.  Denies N/V/CP/SOB/Abd pain. Denies tingling or numbness in LE bilaterally.  She is doing well. Has some knee pain this am. Doing okay with the medications.  Voiding without difficulty.  She reports she feels better about going home today. Mobilizing better with PT.   Objective:   VITALS:   Vitals:   05/07/24 0538 05/07/24 1316 05/07/24 2017 05/08/24 0534  BP: 103/60 134/74 (!) 145/73 116/64  Pulse: (!) 54 (!) 57 60 (!) 57  Resp: 18 20 18 17   Temp: 97.6 F (36.4 C) 97.8 F (36.6 C) 98.4 F (36.9 C) 97.7 F (36.5 C)  TempSrc: Oral Oral Oral Oral  SpO2: 97% 99% 100% 98%  Weight:      Height:        NAD Neurologically intact ABD soft Neurovascular intact Sensation intact distally Intact pulses distally Dorsiflexion/Plantar flexion intact No cellulitis present Compartment soft Prevena negative pressure incisional dressing C/D/I. no leaks detected.  JP drain dressing C/D/I. 100 cc SS fluid.   Lab Results  Component Value Date   WBC 6.5 05/07/2024   HGB 9.9 (L) 05/07/2024   HCT 31.0 (L) 05/07/2024   MCV 95.1 05/07/2024   PLT 223 05/07/2024   BMET    Component Value Date/Time   NA 142 05/07/2024 0324   K 4.0 05/07/2024 0324   CL 106 05/07/2024 0324   CO2 24 05/07/2024 0324   GLUCOSE 106 (H) 05/07/2024 0324   BUN 23 05/07/2024 0324   CREATININE 0.98 05/07/2024 0324   CALCIUM  9.0 05/07/2024 0324   GFRNONAA >60 05/07/2024 0324     Assessment/Plan: 8 Days Post-Op   Principal Problem:   S/p revision of left total hip  ABLA. Hemoglobin 9.9. Asymptomatic. Continue to monitor.   NWB LLE with walker and posterior hip precautions.  DVT ppx: Aspirin , SCDs, TEDS PO pain control PT/OT: 30 and 33 feet with PT yesterday. Continue today.  Dispo: - Plan for d/c home today.  - Maintain negative pressure dressing at 75 mmHg.  -  We will discontinue the JP drain when output is less than 10 cc per shift. - Mobilize out of bed with PT/OT.  - Upon discharge, the house VAC suction unit will be exchanged for a portable Prevena suction unit. She will need to follow-up in the office within 7 days of discharge for removal of her negative pressure dressing.   Valery GORMAN Potters 05/08/2024, 7:37 AM  [facility]  [place] [address], [address], [place], [place] [address] Phone: [phone] [email or web address] Facebook  [employer]

## 2024-05-08 NOTE — Discharge Summary (Signed)
 Physician Discharge Summary  Patient ID: [name] MRN: [id number] DOB/AGE: 03/22/1951 73 y.o.  Admit date: 04/30/2024 Discharge date: 05/08/2024  Admission Diagnoses:  S/p revision of left total hip  Discharge Diagnoses:  Principal Problem:   S/p revision of left total hip   Past Medical History:  Diagnosis Date   Anxiety    Arthritis    Bipolar disorder (HCC)    Bradycardia    Chronic kidney disease    CKD2   Depression    Dysrhythmia    Bradycardia,   History of blood transfusion    Hypertension    Hyperthyroidism    Migraine    Sleep apnea    No CPAP   Thyroid  disease    hyperthyroid    Surgeries: Procedure(s): REVISION, TOTAL ARTHROPLASTY, HIP, ANTERIOR APPROACH on 04/30/2024   Consultants (if any):   Discharged Condition: Improved  Hospital Course: Heather Gray is an 73 y.o. female who was admitted 04/30/2024 with a diagnosis of S/p revision of left total hip and went to the operating room on 04/30/2024 and underwent the above named procedures.    She was given perioperative antibiotics:  Anti-infectives (From admission, onward)    Start     Dose/Rate Route Frequency Ordered Stop   04/30/24 2200  ceFAZolin  (ANCEF ) IVPB 2g/100 mL premix        2 g 200 mL/hr over 30 Minutes Intravenous Every 6 hours 04/30/24 2042 05/01/24 0405   04/30/24 1649  vancomycin  (VANCOCIN ) powder  Status:  Discontinued          As needed 04/30/24 1649 04/30/24 1748   04/30/24 1210  vancomycin  (VANCOCIN ) 1-5 GM/200ML-% IVPB       Note to Pharmacy: Kateri Raisin K: cabinet override      04/30/24 1210 05/01/24 0014   04/30/24 0845  ceFAZolin  (ANCEF ) IVPB 2g/100 mL premix        2 g 200 mL/hr over 30 Minutes Intravenous On call to O.R. 04/30/24 9167 04/30/24 1623       She was given sequential compression devices, early ambulation, and aspirin  for DVT prophylaxis.  POD#1 ABLA hemoglobin 8.8, received 4 units PRBCs. Prevena at 75mmHg no leaks. JP drain 90cc fluid. Slow progression with PT  with NWB restrictions and posterior hip precautions.  POD#2 Prevena at 75mmHg no leaks. JP drain 75 cc fluid. Slow progression with PT with NWB restrictions and posterior hip precautions, 3 feet. ABLA hemoglobin 7.2 she received an additional 2 units PRBCs (6 units total.  POD#3 ABLA hemoglobin 9.2, stable. Prevena at 75mmHg no leaks. JP drain 55 cc fluid. Slow progression with PT with NWB restrictions and posterior hip precautions, 6 feet.  POD#4 Prevena at 75mmHg no leaks. JP drain 140 cc fluid. Slow progression with PT with NWB restrictions and posterior hip precautions, 12 feet. ABLA hemoglobin 10.7.  POD#5 Prevena at 75mmHg no leaks. JP drain 200cc fluid. Slow progression with PT with NWB restrictions and posterior hip precautions 12 feet.  POD#6 Prevena at 75mmHg no leaks. JP drain 210 cc fluid. Slow progression with PT with NWB restrictions and posterior hip precautions, 20 and 24 feet with PT.  POD# 7 Prevena at no leaks. JP drain 200 cc fluid. Slow progression with PT with NWB restrictions and posterior hip precautions 30 and 33 feet.  POD#8 Prevena at 75mmHg no leaks. JP drain 100 cc fluid. Slow progression with PT with NWB restrictions and posterior hip precautions 36 feet. D/c home with JP and portable prevena  vac. She will follow-up in 1 week for removal of Prevena, JP drain check, and wound check.     She benefited maximally from the hospital stay and there were no complications.    Recent vital signs:  Vitals:   05/07/24 2017 05/08/24 0534  BP: (!) 145/73 116/64  Pulse: 60 (!) 57  Resp: 18 17  Temp: 98.4 F (36.9 C) 97.7 F (36.5 C)  SpO2: 100% 98%    Recent laboratory studies:  Lab Results  Component Value Date   HGB 9.9 (L) 05/07/2024   HGB 10.7 (L) 05/04/2024   HGB 9.1 (L) 05/03/2024   Lab Results  Component Value Date   WBC 6.5 05/07/2024   PLT 223 05/07/2024   Lab Results  Component Value Date   INR 1.0 06/17/2020   Lab Results  Component Value Date    NA 142 05/07/2024   K 4.0 05/07/2024   CL 106 05/07/2024   CO2 24 05/07/2024   BUN 23 05/07/2024   CREATININE 0.98 05/07/2024   GLUCOSE 106 (H) 05/07/2024     Allergies as of 05/08/2024       Reactions   Tetracyclines & Related Shortness Of Breath, Palpitations   Sulfa Antibiotics Other (See Comments)   Leg cramping        Medication List     STOP taking these medications    chlorhexidine  4 % external liquid Commonly known as: HIBICLENS        TAKE these medications    ALPRAZolam  0.5 MG tablet Commonly known as: XANAX  Take 1.5 tablets (0.75 mg total) by mouth at bedtime.   amLODipine 5 MG tablet Commonly known as: NORVASC Take 5 mg by mouth daily.   Aspirin  Low Dose 81 MG chewable tablet Generic drug: aspirin  Chew 1 tablet (81 mg total) by mouth 2 (two) times daily with a meal.   Calcium  600/Vitamin D 600-10 MG-MCG Tabs Generic drug: Calcium  Carb-Cholecalciferol  Take 2 tablets by mouth daily.   docusate sodium  100 MG capsule Commonly known as: Colace Take 1 capsule (100 mg total) by mouth 2 (two) times daily. What changed:  when to take this reasons to take this   hydrochlorothiazide  25 MG tablet Commonly known as: HYDRODIURIL  Take 25 mg by mouth daily.   HYDROcodone -acetaminophen  5-325 MG tablet Commonly known as: NORCO/VICODIN Take 1 tablet by mouth every 4 (four) hours as needed for up to 7 days for moderate pain (pain score 4-6) or severe pain (pain score 7-10).   LaMICtal  200 MG tablet Generic drug: lamoTRIgine  Take 200 mg by mouth every morning.   losartan  100 MG tablet Commonly known as: COZAAR  Take 100 mg by mouth at bedtime.   mupirocin  ointment 2 % Commonly known as: BACTROBAN  Apply 1 Application topically 2 (two) times daily.   ondansetron  4 MG tablet Commonly known as: Zofran  Take 1 tablet (4 mg total) by mouth every 8 (eight) hours as needed for nausea or vomiting.   polyethylene glycol powder 17 GM/SCOOP powder Commonly  known as: MiraLax  Take 17 g by mouth daily as needed for mild constipation or moderate constipation.   QUEtiapine  300 MG tablet Commonly known as: SEROQUEL  Take 300 mg by mouth See admin instructions. Take with 50 mg for a total of 350 mg at bedtime   QUEtiapine  50 MG tablet Commonly known as: SEROQUEL  Take 50 mg by mouth See admin instructions. Take with 300 mg for a total of 350 mg at bedtime   rizatriptan 10 MG tablet Commonly known as:  MAXALT Take 10 mg by mouth as needed for migraine. May repeat in 2 hours if needed   senna 8.6 MG Tabs tablet Commonly known as: SENOKOT Take 2 tablets (17.2 mg total) by mouth at bedtime for 15 days. What changed: how much to take   topiramate  200 MG tablet Commonly known as: TOPAMAX  Take 200 mg by mouth at bedtime.               Discharge Care Instructions  (From admission, onward)           Start     Ordered   05/08/24 0000  Non weight bearing       Comments: Posterior hip precautions.  Question Answer Comment  Laterality left   Extremity Lower      05/08/24 0742   05/08/24 0000  Change dressing       Comments: Do not change your dressing.   05/08/24 0742              WEIGHT BEARING   Other:  Non-weightbearing left lower extremity.    EXERCISES  Results after joint replacement surgery are often greatly improved when you follow the exercise, range of motion and muscle strengthening exercises prescribed by your doctor. Safety measures are also important to protect the joint from further injury. Any time any of these exercises cause you to have increased pain or swelling, decrease what you are doing until you are comfortable again and then slowly increase them. If you have problems or questions, call your caregiver or physical therapist for advice.   Rehabilitation is important following a joint replacement. After just a few days of immobilization, the muscles of the leg can become weakened and shrink (atrophy).   These exercises are designed to build up the tone and strength of the thigh and leg muscles and to improve motion. Often times heat used for twenty to thirty minutes before working out will loosen up your tissues and help with improving the range of motion but do not use heat for the first two weeks following surgery (sometimes heat can increase post-operative swelling).   These exercises can be done on a training (exercise) mat, on the floor, on a table or on a bed. Use whatever works the best and is most comfortable for you.    Use music or television while you are exercising so that the exercises are a pleasant break in your day. This will make your life better with the exercises acting as a break in your routine that you can look forward to.   Perform all exercises about fifteen times, three times per day or as directed.  You should exercise both the operative leg and the other leg as well.  Exercises include:   Quad Sets - Tighten up the muscle on the front of the thigh (Quad) and hold for 5-10 seconds.   Straight Leg Raises - With your knee straight (if you were given a brace, keep it on), lift the leg to 60 degrees, hold for 3 seconds, and slowly lower the leg.  Perform this exercise against resistance later as your leg gets stronger.  Leg Slides: Lying on your back, slowly slide your foot toward your buttocks, bending your knee up off the floor (only go as far as is comfortable). Then slowly slide your foot back down until your leg is flat on the floor again.  Angel Wings: Lying on your back spread your legs to the side as far apart as you can  without causing discomfort.  Hamstring Strength:  Lying on your back, push your heel against the floor with your leg straight by tightening up the muscles of your buttocks.  Repeat, but this time bend your knee to a comfortable angle, and push your heel against the floor.  You may put a pillow under the heel to make it more comfortable if necessary.   A  rehabilitation program following joint replacement surgery can speed recovery and prevent re-injury in the future due to weakened muscles. Contact your doctor or a physical therapist for more information on knee rehabilitation.    CONSTIPATION  Constipation is defined medically as fewer than three stools per week and severe constipation as less than one stool per week.  Even if you have a regular bowel pattern at home, your normal regimen is likely to be disrupted due to multiple reasons following surgery.  Combination of anesthesia, postoperative narcotics, change in appetite and fluid intake all can affect your bowels.   YOU MUST use at least one of the following options; they are listed in order of increasing strength to get the job done.  They are all available over the counter, and you may need to use some, POSSIBLY even all of these options:    Drink plenty of fluids (prune juice may be helpful) and high fiber foods Colace 100 mg by mouth twice a day  Senokot for constipation as directed and as needed Dulcolax (bisacodyl ), take with full glass of water   Miralax  (polyethylene glycol) once or twice a day as needed.  If you have tried all these things and are unable to have a bowel movement in the first 3-4 days after surgery call either your surgeon or your primary doctor.    If you experience loose stools or diarrhea, hold the medications until you stool forms back up.  If your symptoms do not get better within 1 week or if they get worse, check with your doctor.  If you experience the worst abdominal pain ever or develop nausea or vomiting, please contact the office immediately for further recommendations for treatment.   ITCHING:  If you experience itching with your medications, try taking only a single pain pill, or even half a pain pill at a time.  You can also use Benadryl  over the counter for itching or also to help with sleep.   TED HOSE STOCKINGS:  Use stockings on both legs until  for at least 2 weeks or as directed by physician office. They may be removed at night for sleeping.  MEDICATIONS:  See your medication summary on the "After Visit Summary" that nursing will review with you.  You may have some home medications which will be placed on hold until you complete the course of blood thinner medication.  It is important for you to complete the blood thinner medication as prescribed.  PRECAUTIONS:  If you experience chest pain or shortness of breath - call 911 immediately for transfer to the hospital emergency department.   If you develop a fever greater that 101 F, purulent drainage from wound, increased redness or drainage from wound, foul odor from the wound/dressing, or calf pain - CONTACT YOUR SURGEON.                                                   FOLLOW-UP APPOINTMENTS:  If  you do not already have a post-op appointment, please call the office for an appointment to be seen by your surgeon.  Guidelines for how soon to be seen are listed in your "After Visit Summary", but are typically between 1-4 weeks after surgery.  OTHER INSTRUCTIONS:   Knee Replacement:  Do not place pillow under knee, focus on keeping the knee straight while resting. CPM instructions: 0-90 degrees, 2 hours in the morning, 2 hours in the afternoon, and 2 hours in the evening. Place foam block, curve side up under heel at all times except when in CPM or when walking.  DO NOT modify, tear, cut, or change the foam block in any way.   MAKE SURE YOU:  Understand these instructions.  Get help right away if you are not doing well or get worse.    Thank you for letting us  be a part of your medical care team.  It is a privilege we respect greatly.  We hope these instructions will help you stay on track for a fast and full recovery!   Diagnostic Studies: DG Pelvis Portable Result Date: 04/30/2024 CLINICAL DATA:  Status post left hip revision. EXAM: PORTABLE PELVIS 1-2 VIEWS COMPARISON:  03/26/2024  FINDINGS: Revision left hip arthroplasty in expected alignment. Acetabular protrusio. Postoperative changes in the soft tissues with overlying skin staples in place. Right hip arthroplasty is intact. IMPRESSION: Revision left hip arthroplasty in expected alignment. Electronically Signed   By: Andrea Gasman M.D.   On: 04/30/2024 19:18   DG HIP UNILAT WITH PELVIS 1V LEFT Result Date: 04/30/2024 CLINICAL DATA:  Elective surgery.  Left hip revision. EXAM: DG HIP (WITH OR WITHOUT PELVIS) 1V*L* COMPARISON:  03/26/2024 FINDINGS: Single fluoroscopic spot view of the pelvis submitted from the operating room. Portions of bilateral hip arthroplasties are seen. Fluoroscopy time 40 seconds. Dose 5.46 mGy. IMPRESSION: Intraoperative fluoroscopy. Electronically Signed   By: Andrea Gasman M.D.   On: 04/30/2024 19:15   DG C-Arm 1-60 Min-No Report Result Date: 04/30/2024 Fluoroscopy was utilized by the requesting physician.  No radiographic interpretation.   DG C-Arm 1-60 Min-No Report Result Date: 04/30/2024 Fluoroscopy was utilized by the requesting physician.  No radiographic interpretation.   DG C-Arm 1-60 Min-No Report Result Date: 04/30/2024 Fluoroscopy was utilized by the requesting physician.  No radiographic interpretation.   DG C-Arm 1-60 Min-No Report Result Date: 04/30/2024 Fluoroscopy was utilized by the requesting physician.  No radiographic interpretation.   DG C-Arm 1-60 Min-No Report Result Date: 04/30/2024 Fluoroscopy was utilized by the requesting physician.  No radiographic interpretation.    Disposition: Discharge disposition: 01-Home or Self Care       Discharge Instructions     Call MD / Call 911   Complete by: As directed    If you experience chest pain or shortness of breath, CALL 911 and be transported to the hospital emergency room.  If you develope a fever above 101 F, pus (white drainage) or increased drainage or redness at the wound, or calf pain, call your surgeon's  office.   Change dressing   Complete by: As directed    Do not change your dressing.   Constipation Prevention   Complete by: As directed    Drink plenty of fluids.  Prune juice may be helpful.  You may use a stool softener, such as Colace (over the counter) 100 mg twice a day.  Use MiraLax  (over the counter) for constipation as needed.   Diet - low sodium heart healthy  Complete by: As directed    Discharge instructions   Complete by: As directed    Elevate toes above nose. Use cryotherapy as needed for pain and swelling.  Keep dressing clean and dry, charge your portable Prevena vac nightly.  Keep a log of JP drain output and bring to your 1st appointment Friday 05/15/24.  Maintain posterior hip precautions and non-weightbearing status.   Driving restrictions   Complete by: As directed    No driving for 6 weeks   Follow the hip precautions as taught in Physical Therapy   Complete by: As directed    Increase activity slowly as tolerated   Complete by: As directed    Lifting restrictions   Complete by: As directed    No lifting for 6 weeks   Non weight bearing   Complete by: As directed    Posterior hip precautions.   Laterality: left   Extremity: Lower   Post-operative opioid taper instructions:   Complete by: As directed    POST-OPERATIVE OPIOID TAPER INSTRUCTIONS: It is important to wean off of your opioid medication as soon as possible. If you do not need pain medication after your surgery it is ok to stop day one. Opioids include: Codeine, Hydrocodone (Norco, Vicodin), Oxycodone (Percocet, oxycontin ) and hydromorphone  amongst others.  Long term and even short term use of opiods can cause: Increased pain response Dependence Constipation Depression Respiratory depression And more.  Withdrawal symptoms can include Flu like symptoms Nausea, vomiting And more Techniques to manage these symptoms Hydrate well Eat regular healthy meals Stay active Use relaxation  techniques(deep breathing, meditating, yoga) Do Not substitute Alcohol  to help with tapering If you have been on opioids for less than two weeks and do not have pain than it is ok to stop all together.  Plan to wean off of opioids This plan should start within one week post op of your joint replacement. Maintain the same interval or time between taking each dose and first decrease the dose.  Cut the total daily intake of opioids by one tablet each day Next start to increase the time between doses. The last dose that should be eliminated is the evening dose.      TED hose   Complete by: As directed    Use stockings (TED hose) for 2 weeks on both leg(s).  You may remove them at night for sleeping.        Follow-up Information     Leigh Valery RAMAN, PA-C Follow up in 7 day(s).   Specialty: Orthopedic Surgery Why: Follow-up within 7 days of discharge for removal of Prevena negative pressure dressing and wound check. Contact information: [address], [address] [place] [address] [phone]         Care, [facility] Follow up.   Specialty: Home Health Services Why: This provider will reach out to you 24-48 hours after discharge to begin Home Health PT services. Contact information: [address] [place] [address] [phone]                  Signed: Valery RAMAN Leigh 05/08/2024, 5:46 PM

## 2024-05-08 NOTE — Progress Notes (Signed)
 Discharge meds in a secure bag delivered to pt in room by this RN

## 2024-05-11 NOTE — Progress Notes (Signed)
 [facility] Transitional Care Management     Situation   Heather Gray is a 73 y.o. female who was contacted today for a transitional care outreach.  Admission Date: 04/30/2024  Discharge Date: 05/08/2024   Institution: [facility]  Diagnosis:  Essential Hypertension   Is this visit eligible for TCM? Yes  Background   Since Discharge: HN phone call with pt. She had a hip revision with a bone graft. Per pt this was a very difficult surgery. Pt still has a JP drain and a wound vac. Pt reports her JP drain is not draining as much. She is looking forward to getting it taken out. She is non-weight bearing on the surgical leg. Pt is at home. Pt's friend is helping take care of her. Pt's friend brought her a lift chair which has given a lot of assistance to pt. Pt prepared by moving all of the rugs and furniture around to be able to use a scooter to get around her home. Her friend is making sure she has food and assistance getting around the home. Pt see surgeon on Friday, 05/15/2024. She does not want an appt with PCP at this time. HN will continue with f/u with pt.   Primary Care Provider on Record: Bernardino JAYSON Level, FNP   Assessment    General Assessment     Type of Visit Telephone   Assessment Completed With Patient   Interpreter Used No   Preferred Language English   Living Arrangement Alone   Support System Friends   Type of Residence Private residence   Home Care Services No   Does Patient Have DME Yes   DME List Vannie; Wound vac; Other; Wheelchair; Bedside commode; Shower chair * Lift chair, scooter   Does Patient Need DME No   Bed or Wheelchair Confined No   Fall History No   Diet Regular   Inadequate Nutrition No   Medication Adherence Problem No   Medication Side Effects No   Difficulty Keeping Appointments No      Flowsheet Row Most Recent Value  Engagement   Call number 1  Two calls made/Contact successful Yes  Is the patient eligible? Yes  Medications    Appointments   Self Management   Patient Teaching   Wrap Up   Two calls made/Contact successful Yes   List of Assessments:  SDOH/COMMUNITY RESOURCES:    Current Resources: None Immediate needs or current concerns: None                 Recommendation    PCP/specialist notified: Yes  Referral Made: No  Referrals made to other disciplines: None   Future Appointments  Date Time Provider Department Center  06/24/2024  3:00 PM Bernardino JAYSON Level, FNP [facility] [id number]      Augustin Saupe, RN, BSN [employer] [phone]    Electronically signed by: Augustin Saupe, RN 05/11/2024 9:34 AM    *Some images could not be shown.
